# Patient Record
Sex: Male | Born: 1995 | Race: White | Hispanic: No | Marital: Single | Smoking: Never smoker
Health system: Southern US, Community
[De-identification: ages and names within clinical notes are randomized; demographics above are authoritative.]

## PROBLEM LIST (undated history)

## (undated) DIAGNOSIS — F1010 Alcohol abuse, uncomplicated: Secondary | ICD-10-CM

## (undated) DIAGNOSIS — K7460 Unspecified cirrhosis of liver: Secondary | ICD-10-CM

## (undated) DIAGNOSIS — K219 Gastro-esophageal reflux disease without esophagitis: Secondary | ICD-10-CM

## (undated) DIAGNOSIS — I509 Heart failure, unspecified: Secondary | ICD-10-CM

## (undated) DIAGNOSIS — K852 Alcohol induced acute pancreatitis without necrosis or infection: Secondary | ICD-10-CM

## (undated) HISTORY — PX: APPENDECTOMY: SHX54

## (undated) HISTORY — PX: TONSILLECTOMY: SUR1361

---
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
## 2017-01-03 IMAGING — CT CT ABD-PELV W/ CM
1 of 2 series · 15 of 32 positions shown, 19 images · IV contrast (omnipaque)
Comparison: None.

CLINICAL DATA: 19-year-old male left lower abdominal pain, nausea
and vomiting.

EXAM:
CT ABDOMEN AND PELVIS WITH CONTRAST
TECHNIQUE: Multidetector CT imaging of the abdomen and pelvis was performed
using the standard protocol following bolus administration of
intravenous contrast.
CONTRAST:  100mL OMNIPAQUE IOHEXOL 300 MG/ML  SOLN

[Series 2: routine abd pel with · axial · 0.70mm/px · z∈[-666,-226]mm · 15 of 97 slices shown, 19 images]
[im 5/97  soft-tissue]
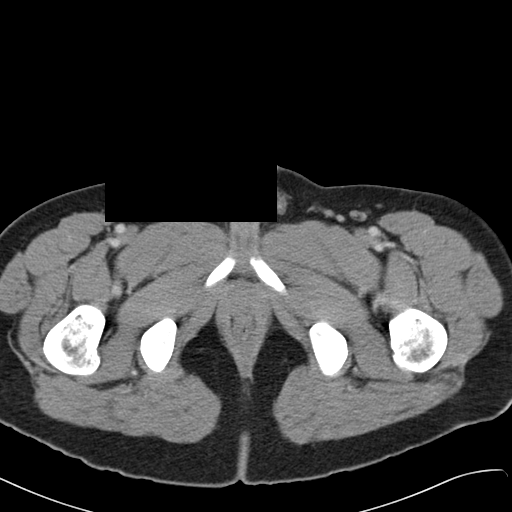
[im 5/97  bone]
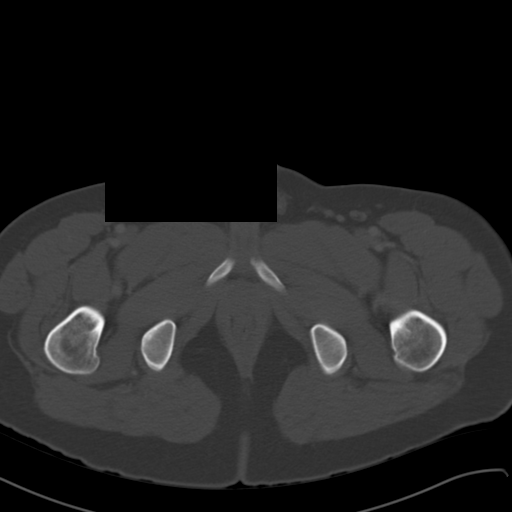
[im 13/97  soft-tissue]
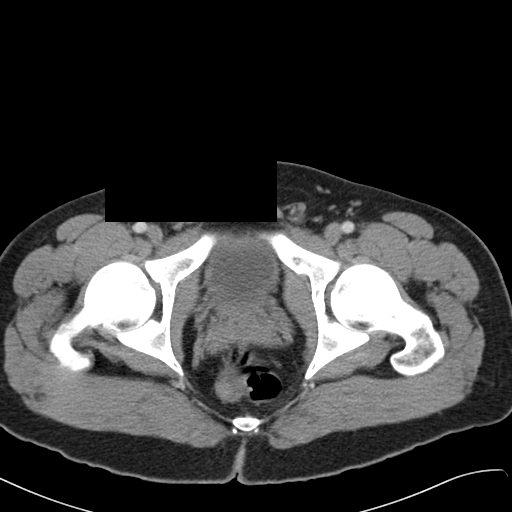
[im 21/97  soft-tissue]
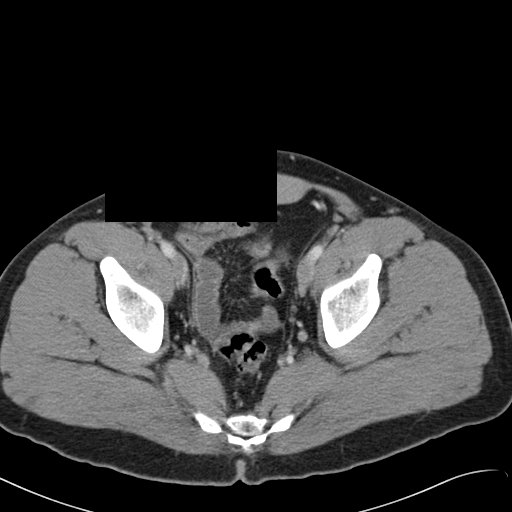
[im 29/97  soft-tissue]
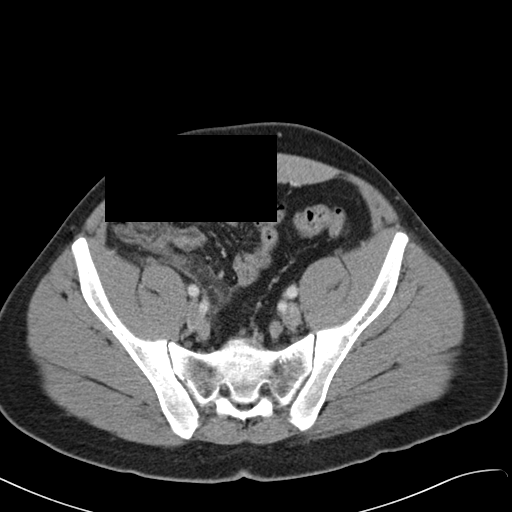
[im 33/97  soft-tissue]
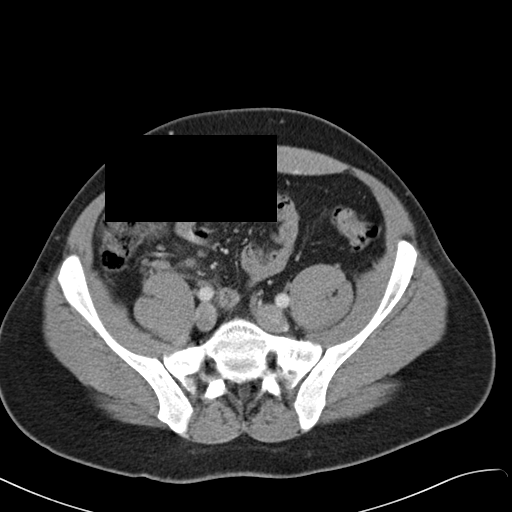
[im 41/97  soft-tissue]
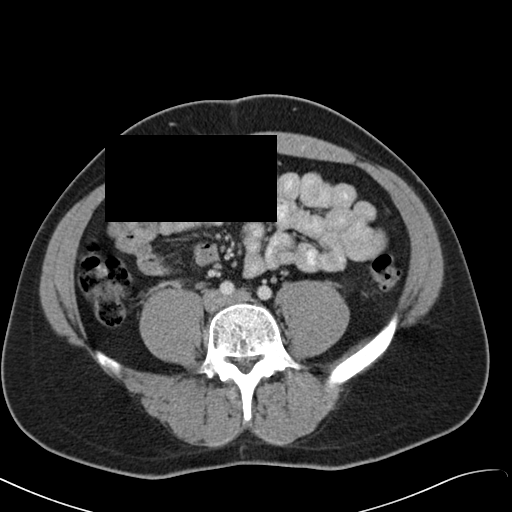
[im 49/97  soft-tissue]
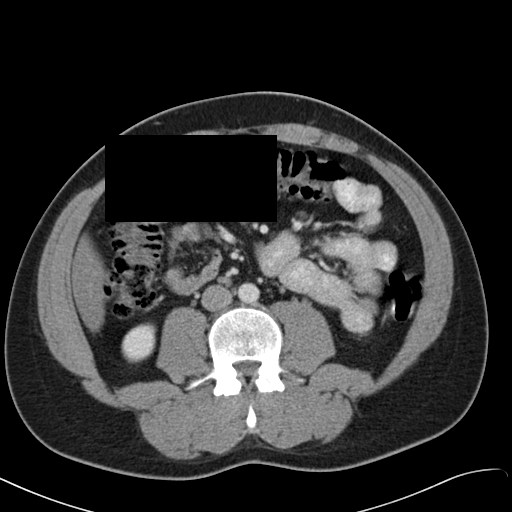
[im 57/97  soft-tissue]
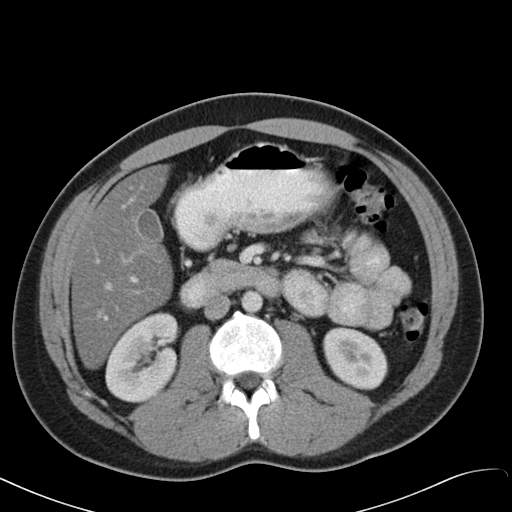
[im 65/97  soft-tissue]
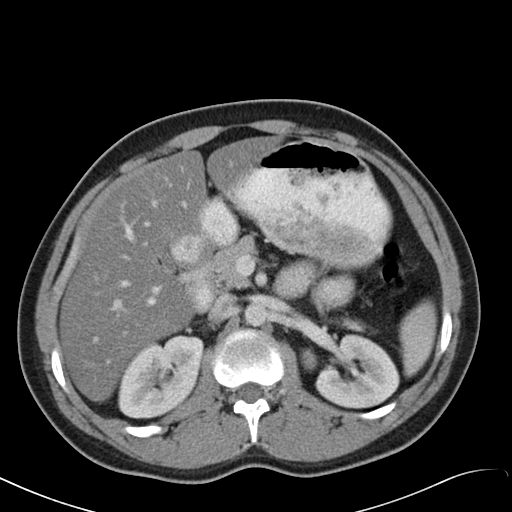
[im 65/97  bone]
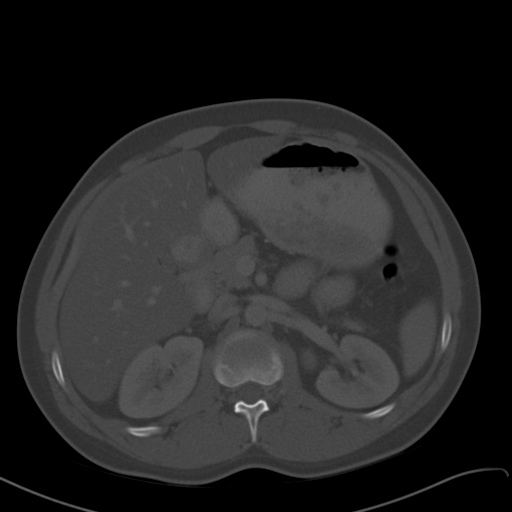
[im 69/97  soft-tissue]
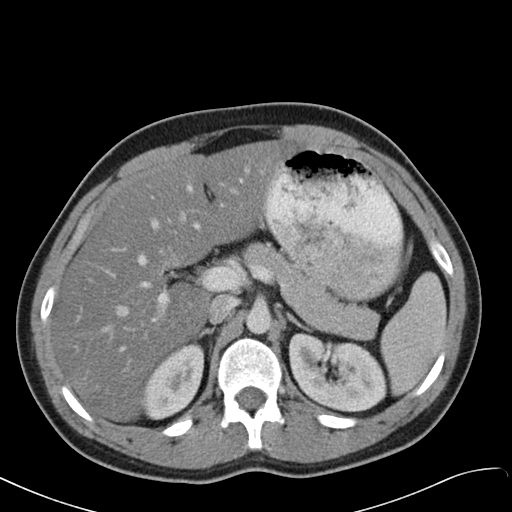
[im 77/97  soft-tissue]
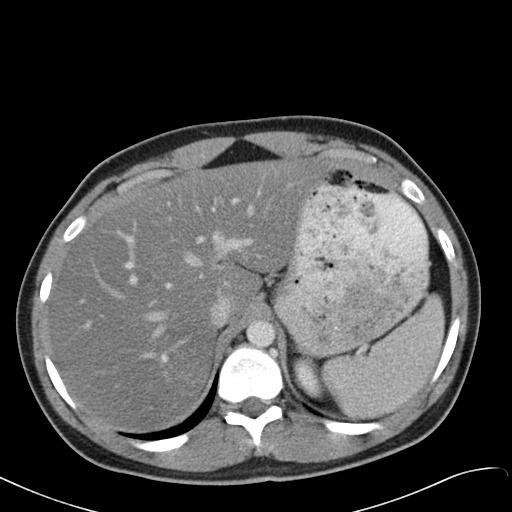
[im 81/97  lung]
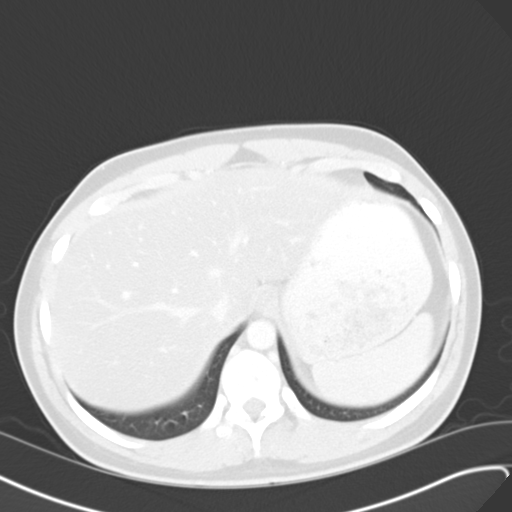
[im 85/97  soft-tissue]
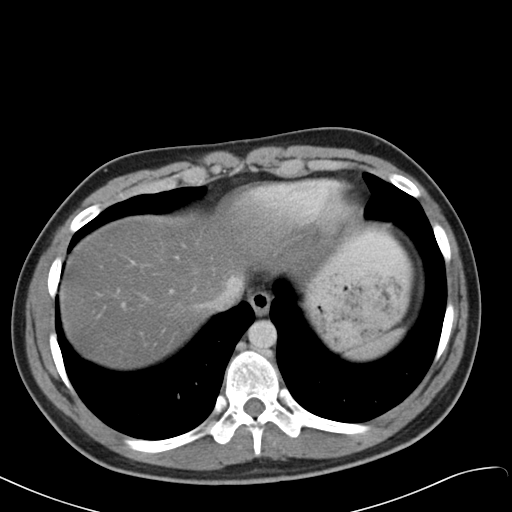
[im 85/97  lung]
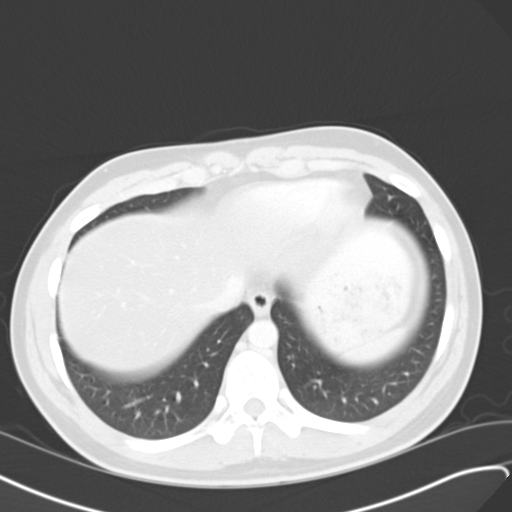
[im 89/97  lung]
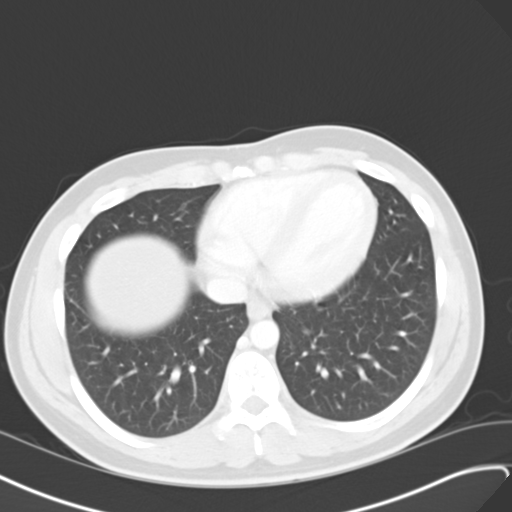
[im 93/97  soft-tissue]
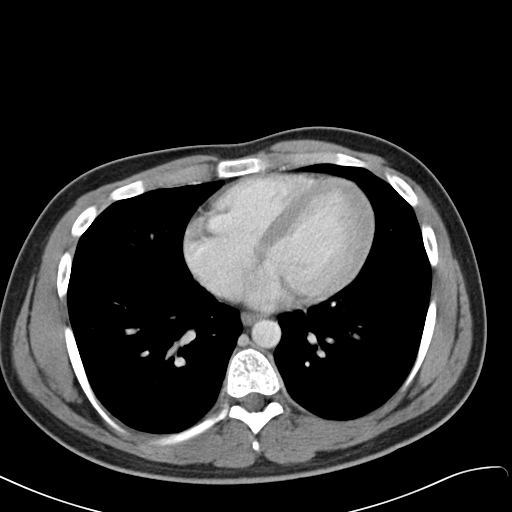
[im 93/97  lung]
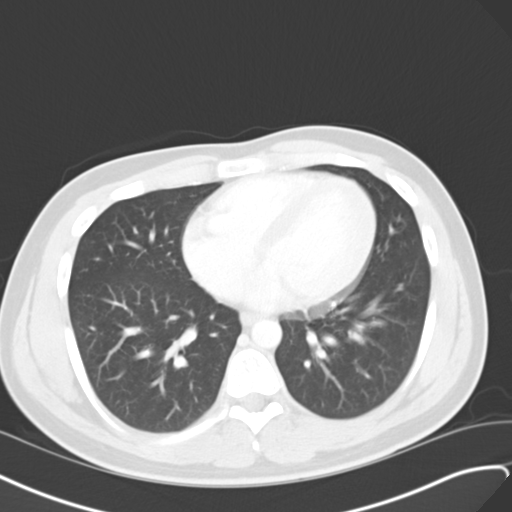

[15 of 32 positions shown; findings below may reference images not displayed]

FINDINGS: The visualized lung bases are clear. No intra-abdominal free air or
free fluid.

Diffuse hepatic steatosis. The gallbladder, pancreas, spleen,
adrenal glands, kidneys, visualized ureters, and urinary bladder
appear unremarkable. The prostate and seminal vesicles are grossly
unremarkable.

There is no evidence of bowel obstruction. Multiple normal caliber
fecalized loops of small bowel noted suggestive of chronic stasis.
The appendix is enlarged and inflamed measuring approximately 12 mm
in diameter. The appendix is located in the right hemipelvis
posterior to the cecum. No fluid collection/abscess. No evidence of
perforation.

The abdominal aorta and IVC appear unremarkable. No portal venous
gas identified. Top-normal right lower quadrant [place] nodes.
The abdominal wall soft tissues appear unremarkable. There is a Find
irregularity of the superior endplate of the T12 vertebra. No acute
fracture
IMPRESSION: Acute appendicitis.  No abscess.

## 2018-12-31 ENCOUNTER — Emergency Department: Payer: No Typology Code available for payment source

## 2018-12-31 ENCOUNTER — Emergency Department
Admission: EM | Admit: 2018-12-31 | Discharge: 2018-12-31 | Disposition: A | Discharge status: Home / Self Care | Payer: No Typology Code available for payment source | Attending: Emergency Medicine | Admitting: Emergency Medicine

## 2018-12-31 ENCOUNTER — Other Ambulatory Visit: Payer: Self-pay

## 2018-12-31 ENCOUNTER — Encounter: Payer: Self-pay | Admitting: Emergency Medicine

## 2018-12-31 DIAGNOSIS — R4781 Slurred speech: Secondary | ICD-10-CM | POA: Diagnosis not present

## 2018-12-31 DIAGNOSIS — S0081XA Abrasion of other part of head, initial encounter: Secondary | ICD-10-CM | POA: Diagnosis not present

## 2018-12-31 DIAGNOSIS — Z23 Encounter for immunization: Secondary | ICD-10-CM | POA: Diagnosis not present

## 2018-12-31 DIAGNOSIS — S0990XA Unspecified injury of head, initial encounter: Secondary | ICD-10-CM | POA: Diagnosis present

## 2018-12-31 DIAGNOSIS — Y929 Unspecified place or not applicable: Secondary | ICD-10-CM | POA: Diagnosis not present

## 2018-12-31 DIAGNOSIS — Z0289 Encounter for other administrative examinations: Secondary | ICD-10-CM | POA: Insufficient documentation

## 2018-12-31 DIAGNOSIS — Y9389 Activity, other specified: Secondary | ICD-10-CM | POA: Insufficient documentation

## 2018-12-31 DIAGNOSIS — Y998 Other external cause status: Secondary | ICD-10-CM | POA: Diagnosis not present

## 2018-12-31 DIAGNOSIS — F1010 Alcohol abuse, uncomplicated: Secondary | ICD-10-CM | POA: Diagnosis not present

## 2018-12-31 LAB — CBC WITH DIFFERENTIAL/PLATELET
Abs Immature Granulocytes: 0.01 10*3/uL (ref 0.00–0.07)
Basophils Absolute: 0.1 10*3/uL (ref 0.0–0.1)
Basophils Relative: 1 %
Eosinophils Absolute: 0.3 10*3/uL (ref 0.0–0.5)
Eosinophils Relative: 4 %
HCT: 47.1 % (ref 39.0–52.0)
Hemoglobin: 16.2 g/dL (ref 13.0–17.0)
Immature Granulocytes: 0 %
Lymphocytes Relative: 35 %
Lymphs Abs: 2.6 10*3/uL (ref 0.7–4.0)
MCH: 31.3 pg (ref 26.0–34.0)
MCHC: 34.4 g/dL (ref 30.0–36.0)
MCV: 90.9 fL (ref 80.0–100.0)
Monocytes Absolute: 0.6 10*3/uL (ref 0.1–1.0)
Monocytes Relative: 8 %
Neutro Abs: 3.8 10*3/uL (ref 1.7–7.7)
Neutrophils Relative %: 52 %
Platelets: 269 10*3/uL (ref 150–400)
RBC: 5.18 MIL/uL (ref 4.22–5.81)
RDW: 12.4 % (ref 11.5–15.5)
WBC: 7.3 10*3/uL (ref 4.0–10.5)
nRBC: 0 % (ref 0.0–0.2)

## 2018-12-31 LAB — HEPATIC FUNCTION PANEL
ALT: 59 U/L — ABNORMAL HIGH (ref 0–44)
AST: 51 U/L — ABNORMAL HIGH (ref 15–41)
Albumin: 4.5 g/dL (ref 3.5–5.0)
Alkaline Phosphatase: 60 U/L (ref 38–126)
Bilirubin, Direct: 0.1 mg/dL (ref 0.0–0.2)
Indirect Bilirubin: 0.9 mg/dL (ref 0.3–0.9)
Total Bilirubin: 1 mg/dL (ref 0.3–1.2)
Total Protein: 8.3 g/dL — ABNORMAL HIGH (ref 6.5–8.1)

## 2018-12-31 LAB — BASIC METABOLIC PANEL
Anion gap: 13 (ref 5–15)
BUN: 13 mg/dL (ref 6–20)
CO2: 22 mmol/L (ref 22–32)
Calcium: 8.6 mg/dL — ABNORMAL LOW (ref 8.9–10.3)
Chloride: 103 mmol/L (ref 98–111)
Creatinine, Ser: 0.64 mg/dL (ref 0.61–1.24)
GFR calc Af Amer: >60 mL/min (ref >60)
GFR calc non Af Amer: >60 mL/min (ref >60)
Glucose, Bld: 108 mg/dL — ABNORMAL HIGH (ref 70–99)
Potassium: 3.7 mmol/L (ref 3.5–5.1)
Sodium: 138 mmol/L (ref 135–145)

## 2018-12-31 MED ORDER — DROPERIDOL 2.5 MG/ML IJ SOLN
2.5000 mg | Freq: Once | INTRAMUSCULAR | Status: AC
  Administered 2018-12-31: 03:00:00 2.5 mg via INTRAMUSCULAR

## 2018-12-31 MED ORDER — DROPERIDOL 2.5 MG/ML IJ SOLN
2.5000 mg | Freq: Once | INTRAMUSCULAR | Status: AC
  Administered 2018-12-31: 04:00:00 2.5 mg via INTRAVENOUS

## 2018-12-31 MED ORDER — IOHEXOL 350 MG/ML SOLN
125.0000 mL | Freq: Once | INTRAVENOUS | Status: AC | PRN
  Administered 2018-12-31: 06:00:00 125 mL via INTRAVENOUS

## 2018-12-31 MED ORDER — TETANUS-DIPHTH-ACELL PERTUSSIS 5-2.5-18.5 LF-MCG/0.5 IM SUSP
0.5000 mL | Freq: Once | INTRAMUSCULAR | Status: AC
  Administered 2018-12-31: 05:00:00 0.5 mL via INTRAMUSCULAR
  Filled 2018-12-31: qty 0.5

## 2018-12-31 NOTE — ED Notes (Signed)
Pt agitated does not want to cooperate with RN Rn attempted to start IV pt refuses reports he is ready to go to jail

## 2018-12-31 NOTE — ED Notes (Signed)
X-ray at bedside
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
## 2018-12-31 NOTE — Discharge Instructions (Signed)
Your Cts were negative. There was concern for foreign body in your right nostril I did not see anything on exam but you can exam the area yourself as well.  You should cut down on drinking.
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
## 2018-12-31 NOTE — ED Notes (Signed)
Patient transported to CT 

## 2019-11-21 ENCOUNTER — Other Ambulatory Visit: Payer: Self-pay

## 2019-11-21 ENCOUNTER — Emergency Department: Payer: HRSA Program

## 2019-11-21 ENCOUNTER — Emergency Department
Admission: EM | Admit: 2019-11-21 | Discharge: 2019-11-21 | Disposition: A | Discharge status: Home / Self Care | Payer: HRSA Program | Attending: Emergency Medicine | Admitting: Emergency Medicine

## 2019-11-21 ENCOUNTER — Encounter: Payer: Self-pay | Admitting: Emergency Medicine

## 2019-11-21 DIAGNOSIS — R0602 Shortness of breath: Secondary | ICD-10-CM | POA: Diagnosis present

## 2019-11-21 DIAGNOSIS — U071 COVID-19: Secondary | ICD-10-CM | POA: Diagnosis not present

## 2019-11-21 DIAGNOSIS — J1282 Pneumonia due to coronavirus disease 2019: Secondary | ICD-10-CM | POA: Diagnosis not present

## 2019-11-21 LAB — RESPIRATORY PANEL BY RT PCR (FLU A&B, COVID)
Influenza A by PCR: NEGATIVE
Influenza B by PCR: NEGATIVE
SARS Coronavirus 2 by RT PCR: POSITIVE — AB

## 2019-11-21 LAB — COMPREHENSIVE METABOLIC PANEL
ALT: 63 U/L — ABNORMAL HIGH (ref 0–44)
AST: 55 U/L — ABNORMAL HIGH (ref 15–41)
Albumin: 4 g/dL (ref 3.5–5.0)
Alkaline Phosphatase: 51 U/L (ref 38–126)
Anion gap: 12 (ref 5–15)
BUN: 9 mg/dL (ref 6–20)
CO2: 25 mmol/L (ref 22–32)
Calcium: 8.9 mg/dL (ref 8.9–10.3)
Chloride: 95 mmol/L — ABNORMAL LOW (ref 98–111)
Creatinine, Ser: 0.87 mg/dL (ref 0.61–1.24)
GFR calc Af Amer: >60 mL/min (ref >60)
GFR calc non Af Amer: >60 mL/min (ref >60)
Glucose, Bld: 114 mg/dL — ABNORMAL HIGH (ref 70–99)
Potassium: 4 mmol/L (ref 3.5–5.1)
Sodium: 132 mmol/L — ABNORMAL LOW (ref 135–145)
Total Bilirubin: 1 mg/dL (ref 0.3–1.2)
Total Protein: 8.4 g/dL — ABNORMAL HIGH (ref 6.5–8.1)

## 2019-11-21 LAB — CBC WITH DIFFERENTIAL/PLATELET
Abs Immature Granulocytes: 0.02 10*3/uL (ref 0.00–0.07)
Basophils Absolute: 0 10*3/uL (ref 0.0–0.1)
Basophils Relative: 0 %
Eosinophils Absolute: 0 10*3/uL (ref 0.0–0.5)
Eosinophils Relative: 0 %
HCT: 45.7 % (ref 39.0–52.0)
Hemoglobin: 16.5 g/dL (ref 13.0–17.0)
Immature Granulocytes: 0 %
Lymphocytes Relative: 22 %
Lymphs Abs: 1.1 10*3/uL (ref 0.7–4.0)
MCH: 32 pg (ref 26.0–34.0)
MCHC: 36.1 g/dL — ABNORMAL HIGH (ref 30.0–36.0)
MCV: 88.6 fL (ref 80.0–100.0)
Monocytes Absolute: 0.3 10*3/uL (ref 0.1–1.0)
Monocytes Relative: 6 %
Neutro Abs: 3.6 10*3/uL (ref 1.7–7.7)
Neutrophils Relative %: 72 %
Platelets: 223 10*3/uL (ref 150–400)
RBC: 5.16 MIL/uL (ref 4.22–5.81)
RDW: 11.5 % (ref 11.5–15.5)
WBC: 5 10*3/uL (ref 4.0–10.5)
nRBC: 0 % (ref 0.0–0.2)

## 2019-11-21 LAB — TROPONIN I (HIGH SENSITIVITY): Troponin I (High Sensitivity): 6 ng/L (ref <18)

## 2019-11-21 LAB — LIPASE, BLOOD: Lipase: 82 U/L — ABNORMAL HIGH (ref 11–51)

## 2019-11-21 LAB — GROUP A STREP BY PCR: Group A Strep by PCR: NOT DETECTED

## 2019-11-21 MED ORDER — AZITHROMYCIN 500 MG PO TABS
500.0000 mg | ORAL_TABLET | Freq: Once | ORAL | Status: AC
  Administered 2019-11-21: 500 mg via ORAL
  Filled 2019-11-21: qty 1

## 2019-11-21 MED ORDER — PREDNISONE 20 MG PO TABS
60.0000 mg | ORAL_TABLET | Freq: Once | ORAL | Status: AC
  Administered 2019-11-21: 60 mg via ORAL
  Filled 2019-11-21: qty 3

## 2019-11-21 MED ORDER — ACETAMINOPHEN 325 MG PO TABS
650.0000 mg | ORAL_TABLET | Freq: Once | ORAL | Status: AC | PRN
  Administered 2019-11-21: 650 mg via ORAL
  Filled 2019-11-21: qty 2

## 2019-11-21 MED ORDER — ONDANSETRON 4 MG PO TBDP
4.0000 mg | ORAL_TABLET | Freq: Once | ORAL | Status: AC
  Administered 2019-11-21: 4 mg via ORAL
  Filled 2019-11-21: qty 1
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
## 2019-11-21 NOTE — ED Provider Notes (Signed)
[facility] Emergency Department Provider Note ____________________________________________  Time seen: 54  I have reviewed the triage vital signs and the nursing notes.  HISTORY  Chief Complaint  covid sx and Emesis  HPI Cameron Santiago is a 24 y.o. male presents himself to the ED for evaluation of nausea, vomiting, and diarrhea.  Patient also reports body aches and sore throat.  He also has been having some shortness of breath and chest pain at this time.  He has not taken any medication today for symptom relief.  Patient otherwise denies any significant medical history, and takes no daily medications.   History reviewed. No pertinent past medical history.  Patient Active Problem List   Diagnosis Date Noted  . Acute appendicitis 02/14/2015    Past Surgical History:  Procedure Laterality Date  . APPENDECTOMY    . LAPAROSCOPIC APPENDECTOMY N/A 02/15/2015   Procedure: APPENDECTOMY LAPAROSCOPIC;  Surgeon: Natale Lay, MD;  Location: [facility] ORS;  Service: General;  Laterality: N/A;  . TONSILLECTOMY      Prior to Admission medications   Medication Sig Start Date End Date Taking? Authorizing Provider  albuterol (VENTOLIN HFA) 108 (90 Base) MCG/ACT inhaler Inhale 2 puffs into the lungs every 6 (six) hours as needed for wheezing or shortness of breath. 11/22/19   Lucy Chris, PA  azithromycin (ZITHROMAX) 500 MG tablet Take 1 tablet (500 mg total) by mouth daily for 5 days. Take 1 tablet daily for 3 days. 11/22/19 11/27/19  Lucy Chris, PA  ondansetron (ZOFRAN) 4 MG tablet Take 1 tablet (4 mg total) by mouth daily as needed for nausea or vomiting. 11/22/19 11/21/20  Lucy Chris, PA  predniSONE (DELTASONE) 10 MG tablet Take 6 tablets (60 mg total) by mouth daily for 1 day, THEN 5 tablets (50 mg total) daily for 1 day, THEN 4 tablets (40 mg total) daily for 1 day, THEN 3 tablets (30 mg total) daily for 1 day, THEN 2 tablets (20 mg total) daily for 1  day, THEN 1 tablet (10 mg total) daily for 1 day. 11/22/19 11/28/19  Lucy Chris, PA    Allergies Patient has no known allergies.  History reviewed. No pertinent family history.  Social History Social History   Tobacco Use  . Smoking status: Never Smoker  . Smokeless tobacco: Never Used  Substance Use Topics  . Alcohol use: No  . Drug use: Never    Review of Systems  Constitutional: Positive for fever. Eyes: Negative for visual changes. ENT: Positive for sore throat. Cardiovascular: Positive for chest pain. Respiratory: Positive for shortness of breath. Gastrointestinal: Negative for abdominal pain. Reports nausea, vomiting and diarrhea. Genitourinary: Negative for dysuria. Musculoskeletal: Negative for back pain. Skin: Negative for rash. Neurological: Negative for headaches, focal weakness or numbness. ____________________________________________  PHYSICAL EXAM:  VITAL SIGNS: ED Triage Vitals [11/21/19 1827]  Enc Vitals Group     BP (!) 138/106     Pulse Rate (!) 102     Resp 20     Temp (!) 102.5 F (39.2 C)     Temp Source Oral     SpO2 96 %     Weight 240 lb (108.9 kg)     Height 5\' 10"  (1.778 m)     Head Circumference      Peak Flow      Pain Score 10     Pain Loc      Pain Edu?      Excl. in GC?  Constitutional: Alert and oriented. Well appearing and in no distress. Febrile on presentation  Head: Normocephalic and atraumatic. Eyes: Conjunctivae are normal. Normal extraocular movements Cardiovascular: Normal rate, regular rhythm. Normal distal pulses. Respiratory: Normal respiratory effort. No wheezes/rales/rhonchi. Gastrointestinal: Soft and nontender. No distention. Musculoskeletal: Nontender with normal range of motion in all extremities.  Neurologic:  Normal gait without ataxia. Normal speech and language. No gross focal neurologic deficits are appreciated. Skin:  Skin is warm, dry and intact. No rash noted. Psychiatric: Mood and affect  are normal. Patient exhibits appropriate insight and judgment. ____________________________________________   LABS (pertinent positives/negatives) Labs Reviewed  RESPIRATORY PANEL BY RT PCR (FLU A&B, COVID) - Abnormal; Notable for the following components:      Result Value   SARS Coronavirus 2 by RT PCR POSITIVE (*)    All other components within normal limits  COMPREHENSIVE METABOLIC PANEL - Abnormal; Notable for the following components:   Sodium 132 (*)    Chloride 95 (*)    Glucose, Bld 114 (*)    Total Protein 8.4 (*)    AST 55 (*)    ALT 63 (*)    All other components within normal limits  LIPASE, BLOOD - Abnormal; Notable for the following components:   Lipase 82 (*)    All other components within normal limits  CBC WITH DIFFERENTIAL/PLATELET - Abnormal; Notable for the following components:   MCHC 36.1 (*)    All other components within normal limits  GROUP A STREP BY PCR  TROPONIN I (HIGH SENSITIVITY)  ____________________________________________  EKG  ____________________________________________   RADIOLOGY  CXR  IMPRESSION: Probable multifocal viral pneumonia ____________________________________________  PROCEDURES  Tylenol 650 mg PO Azithromycin 500 mg PO Zofran 4 mg ODT Prednisone 60 mg PO  Procedures ____________________________________________  INITIAL IMPRESSION / ASSESSMENT AND PLAN / ED COURSE  DDX: Covid, CAP, sinusitis, AOM, strep pharyngitis  Patient with ED evaluation of a weeks worth of symptoms including fevers, cough, shortness of breath.  Patient was found to be stable on exam without signs of acute respiratory distress, dehydration, or sepsis cyst.  His chest x-ray did confirm multifocal changes consistent with a viral etiology.  He was discharged with a presumptive diagnosis of Covid pneumonia, with prescriptions for azithromycin, prednisone, albuterol inhalers, Zofran, Tessalon Perles.  His Covid test did confirm positive at the  patient at discharge.  He was given appropriate discharge instructions including instructions to continue to monitor and treat fevers with Tylenol, and remain in house quarantine for an additional 7 to 10 days until symptoms improve.  Return precautions have been discussed and patient is discharged at this time to his own care.  Cameron Santiago was evaluated in Emergency Department on 11/22/2019 for the symptoms described in the history of present illness. He was evaluated in the context of the global COVID-19 pandemic, which necessitated consideration that the patient might be at risk for infection with the SARS-CoV-2 virus that causes COVID-19. Institutional protocols and algorithms that pertain to the evaluation of patients at risk for COVID-19 are in a state of rapid change based on information released by regulatory bodies including the CDC and federal and state organizations. These policies and algorithms were followed during the patient's care in the ED. ____________________________________________  FINAL CLINICAL IMPRESSION(S) / ED DIAGNOSES  Final diagnoses:  COVID-19  Pneumonia due to COVID-19 virus      Lissa Hoard, PA-C 11/22/19 1619    Phineas Semen, MD 11/23/19 [phone]

## 2019-11-21 NOTE — ED Notes (Signed)
Pt staets that he hasn't been feeling good and that he's had cp, shob, a headache, and a cough. Pt denies covid contacts to his knowledge /

## 2019-11-21 NOTE — Discharge Instructions (Signed)
Remain under house quarantine for another week, and until it is improved.  Take the prescription medications as provided.  Continue to hydrate to prevent dehydration.  Follow-up with your provider [facility], or return to the ED for worsening symptoms.  You may continue to take Tylenol at 1000 mg 3 times a day for fever and body aches.

## 2019-11-22 ENCOUNTER — Telehealth: Payer: Self-pay | Admitting: Student

## 2019-11-22 ENCOUNTER — Telehealth: Payer: Self-pay | Admitting: Infectious Diseases

## 2019-11-22 MED ORDER — PREDNISONE 10 MG PO TABS: ORAL_TABLET | ORAL | 0 refills | Status: AC

## 2019-11-22 MED ORDER — ALBUTEROL SULFATE HFA 108 (90 BASE) MCG/ACT IN AERS: 2.0000 | INHALATION_SPRAY | Freq: Four times a day (QID) | RESPIRATORY_TRACT | 2 refills | Status: DC | PRN

## 2019-11-22 MED ORDER — ONDANSETRON HCL 4 MG PO TABS: 4.0000 mg | ORAL_TABLET | Freq: Every day | ORAL | 1 refills | Status: DC | PRN

## 2019-11-22 MED ORDER — AZITHROMYCIN 500 MG PO TABS: 500.0000 mg | ORAL_TABLET | Freq: Every day | ORAL | 0 refills | Status: AC

## 2019-11-22 NOTE — Telephone Encounter (Signed)
Called to Discuss with patient about Covid symptoms and the use of the monoclonal antibody infusion for those with mild to moderate Covid symptoms and at a high risk of hospitalization.     Pt appears to qualify for this infusion due to co-morbid conditions and/or a member of an at-risk group in accordance with the FDA Emergency Use Authorization.    He is going to plan on picking up the medications he was given in the ER last night. Politely declined at this time. Sx started about 3-4 days ago.    Rexene Alberts, MSN, NP-C [facility]  [email or web address] Pager: [phone] Office: [phone] [facility] Main Line: [phone]

## 2019-11-22 NOTE — Telephone Encounter (Cosign Needed)
The patient was seen in the ER last night, however medications did not arrive at his pharmacy.  They were sent at this time.

## 2020-08-27 ENCOUNTER — Emergency Department: Payer: Self-pay

## 2020-08-27 ENCOUNTER — Emergency Department
Admission: EM | Admit: 2020-08-27 | Discharge: 2020-08-27 | Disposition: A | Discharge status: Home / Self Care | Payer: Self-pay | Attending: Emergency Medicine | Admitting: Emergency Medicine

## 2020-08-27 ENCOUNTER — Other Ambulatory Visit: Payer: Self-pay

## 2020-08-27 DIAGNOSIS — E860 Dehydration: Secondary | ICD-10-CM | POA: Insufficient documentation

## 2020-08-27 DIAGNOSIS — R7401 Elevation of levels of liver transaminase levels: Secondary | ICD-10-CM | POA: Insufficient documentation

## 2020-08-27 DIAGNOSIS — R112 Nausea with vomiting, unspecified: Secondary | ICD-10-CM | POA: Insufficient documentation

## 2020-08-27 DIAGNOSIS — R1084 Generalized abdominal pain: Secondary | ICD-10-CM | POA: Insufficient documentation

## 2020-08-27 DIAGNOSIS — R197 Diarrhea, unspecified: Secondary | ICD-10-CM | POA: Insufficient documentation

## 2020-08-27 DIAGNOSIS — R1011 Right upper quadrant pain: Secondary | ICD-10-CM | POA: Insufficient documentation

## 2020-08-27 LAB — HEPATITIS PANEL, ACUTE
HCV Ab: NONREACTIVE
Hep A IgM: NONREACTIVE
Hep B C IgM: NONREACTIVE
Hepatitis B Surface Ag: NONREACTIVE

## 2020-08-27 LAB — URINALYSIS, COMPLETE (UACMP) WITH MICROSCOPIC
Bacteria, UA: NONE SEEN
Bilirubin Urine: NEGATIVE
Glucose, UA: NEGATIVE mg/dL
Hgb urine dipstick: NEGATIVE
Ketones, ur: 20 mg/dL — AB
Leukocytes,Ua: NEGATIVE
Nitrite: NEGATIVE
Protein, ur: 100 mg/dL — AB
Specific Gravity, Urine: 1.026 (ref 1.005–1.030)
Squamous Epithelial / HPF: NONE SEEN (ref 0–5)
pH: 5 (ref 5.0–8.0)

## 2020-08-27 LAB — CBC
HCT: 43.7 % (ref 39.0–52.0)
Hemoglobin: 16 g/dL (ref 13.0–17.0)
MCH: 33.5 pg (ref 26.0–34.0)
MCHC: 36.6 g/dL — ABNORMAL HIGH (ref 30.0–36.0)
MCV: 91.6 fL (ref 80.0–100.0)
Platelets: 203 10*3/uL (ref 150–400)
RBC: 4.77 MIL/uL (ref 4.22–5.81)
RDW: 12.1 % (ref 11.5–15.5)
WBC: 12.8 10*3/uL — ABNORMAL HIGH (ref 4.0–10.5)
nRBC: 0 % (ref 0.0–0.2)

## 2020-08-27 LAB — LIPASE, BLOOD: Lipase: 137 U/L — ABNORMAL HIGH (ref 11–51)

## 2020-08-27 LAB — COMPREHENSIVE METABOLIC PANEL
ALT: 64 U/L — ABNORMAL HIGH (ref 0–44)
AST: 99 U/L — ABNORMAL HIGH (ref 15–41)
Albumin: 4.1 g/dL (ref 3.5–5.0)
Alkaline Phosphatase: 97 U/L (ref 38–126)
Anion gap: 11 (ref 5–15)
BUN: 6 mg/dL (ref 6–20)
CO2: 24 mmol/L (ref 22–32)
Calcium: 8.7 mg/dL — ABNORMAL LOW (ref 8.9–10.3)
Chloride: 99 mmol/L (ref 98–111)
Creatinine, Ser: 0.69 mg/dL (ref 0.61–1.24)
GFR, Estimated: >60 mL/min (ref >60)
Glucose, Bld: 107 mg/dL — ABNORMAL HIGH (ref 70–99)
Potassium: 3.3 mmol/L — ABNORMAL LOW (ref 3.5–5.1)
Sodium: 134 mmol/L — ABNORMAL LOW (ref 135–145)
Total Bilirubin: 1.7 mg/dL — ABNORMAL HIGH (ref 0.3–1.2)
Total Protein: 8.2 g/dL — ABNORMAL HIGH (ref 6.5–8.1)

## 2020-08-27 MED ORDER — IBUPROFEN 600 MG PO TABS: 600.0000 mg | ORAL_TABLET | Freq: Three times a day (TID) | ORAL | 0 refills | Status: DC | PRN

## 2020-08-27 MED ORDER — ONDANSETRON HCL 4 MG/2ML IJ SOLN
4.0000 mg | Freq: Once | INTRAMUSCULAR | Status: AC
  Administered 2020-08-27: 4 mg via INTRAVENOUS
  Filled 2020-08-27: qty 2

## 2020-08-27 MED ORDER — SODIUM CHLORIDE 0.9 % IV BOLUS
1000.0000 mL | Freq: Once | INTRAVENOUS | Status: AC
  Administered 2020-08-27: 1000 mL via INTRAVENOUS

## 2020-08-27 MED ORDER — ONDANSETRON 4 MG PO TBDP: 4.0000 mg | ORAL_TABLET | Freq: Three times a day (TID) | ORAL | 0 refills | Status: DC | PRN

## 2020-08-27 MED ORDER — HYDROMORPHONE HCL 1 MG/ML IJ SOLN
1.0000 mg | Freq: Once | INTRAMUSCULAR | Status: AC
  Administered 2020-08-27: 1 mg via INTRAVENOUS
  Filled 2020-08-27: qty 1

## 2020-08-27 MED ORDER — DICYCLOMINE HCL 10 MG PO CAPS: 10.0000 mg | ORAL_CAPSULE | Freq: Three times a day (TID) | ORAL | 0 refills | Status: DC | PRN

## 2020-08-27 NOTE — ED Provider Notes (Signed)
[facility] Emergency Department Provider Note  ____________________________________________   Event Date/Time   First MD Initiated Contact with Patient 08/27/20 1539     (approximate)  I have reviewed the triage vital signs and the nursing notes.   HISTORY  Chief Complaint Abdominal Pain    HPI Cameron Santiago is a 25 y.o. male  here with abdominal pain. Pt reports his sx started 3-4 days ago with low grade fever, nausea, and abd cramping. Since then, his fever has resolved but he's had ongoing, diffuse abd pain and cramping along with nausea, vomiting, and diarrhea. He has had intermittent but not constant pain, worse w/ eating. No alleviating factors. [place] the onset of sx but denies specific sick contacts or suspicious food intake. No blood in emesis or stool. No other complaints. No dysuria, hematuria. No cough or SOB.        History reviewed. No pertinent past medical history.  Patient Active Problem List   Diagnosis Date Noted   Acute appendicitis 02/14/2015    Past Surgical History:  Procedure Laterality Date   APPENDECTOMY     LAPAROSCOPIC APPENDECTOMY N/A 02/15/2015   Procedure: APPENDECTOMY LAPAROSCOPIC;  Surgeon: Natale Lay, MD;  Location: [facility] ORS;  Service: General;  Laterality: N/A;   TONSILLECTOMY      Prior to Admission medications   Medication Sig Start Date End Date Taking? Authorizing Provider  dicyclomine (BENTYL) 10 MG capsule Take 1 capsule (10 mg total) by mouth 3 (three) times daily as needed for spasms. 08/27/20  Yes Shaune Pollack, MD  ibuprofen (ADVIL) 600 MG tablet Take 1 tablet (600 mg total) by mouth every 8 (eight) hours as needed for moderate pain. 08/27/20  Yes Shaune Pollack, MD  ondansetron (ZOFRAN ODT) 4 MG disintegrating tablet Take 1 tablet (4 mg total) by mouth every 8 (eight) hours as needed for nausea or vomiting. 08/27/20  Yes Shaune Pollack, MD  albuterol (VENTOLIN HFA) 108 (90  Base) MCG/ACT inhaler Inhale 2 puffs into the lungs every 6 (six) hours as needed for wheezing or shortness of breath. 11/22/19   Lucy Chris, PA  ondansetron (ZOFRAN) 4 MG tablet Take 1 tablet (4 mg total) by mouth daily as needed for nausea or vomiting. 11/22/19 11/21/20  Lucy Chris, PA    Allergies Patient has no known allergies.  No family history on file.  Social History Social History   Tobacco Use   Smoking status: Never   Smokeless tobacco: Never  Substance Use Topics   Alcohol use: No   Drug use: Never    Review of Systems  Review of Systems  Constitutional:  Positive for fatigue. Negative for chills and fever.  HENT:  Negative for sore throat.   Respiratory:  Negative for shortness of breath.   Cardiovascular:  Negative for chest pain.  Gastrointestinal:  Positive for abdominal pain, diarrhea, nausea and vomiting.  Genitourinary:  Negative for flank pain.  Musculoskeletal:  Negative for neck pain.  Skin:  Negative for rash and wound.  Allergic/Immunologic: Negative for immunocompromised state.  Neurological:  Negative for weakness and numbness.  Hematological:  Does not bruise/bleed easily.  All other systems reviewed and are negative.   ____________________________________________  PHYSICAL EXAM:      VITAL SIGNS: ED Triage Vitals  Enc Vitals Group     BP 08/27/20 1350 (!) 162/124     Pulse Rate 08/27/20 1348 93     Resp 08/27/20 1348 16  Temp 08/27/20 1348 98.3 F (36.8 C)     Temp Source 08/27/20 1348 Oral     SpO2 08/27/20 1348 97 %     Weight --      Height 08/27/20 1348 5\' 11"  (1.803 m)     Head Circumference --      Peak Flow --      Pain Score 08/27/20 1348 6     Pain Loc --      Pain Edu? --      Excl. in GC? --      Physical Exam Vitals and nursing note reviewed.  Constitutional:      General: He is not in acute distress.    Appearance: He is well-developed.  HENT:     Head: Normocephalic and atraumatic.  Eyes:      Conjunctiva/sclera: Conjunctivae normal.  Cardiovascular:     Rate and Rhythm: Normal rate and regular rhythm.     Heart sounds: Normal heart sounds. No murmur heard.   No friction rub.  Pulmonary:     Effort: Pulmonary effort is normal. No respiratory distress.     Breath sounds: Normal breath sounds. No wheezing or rales.  Abdominal:     General: There is no distension.     Palpations: Abdomen is soft.     Tenderness: There is no abdominal tenderness (abdomen is actuanlly non-tender). There is no guarding or rebound. Negative signs include Murphy's sign, Rovsing's sign and McBurney's sign.  Musculoskeletal:     Cervical back: Neck supple.  Skin:    General: Skin is warm.     Capillary Refill: Capillary refill takes less than 2 seconds.  Neurological:     Mental Status: He is alert and oriented to person, place, and time.     Motor: No abnormal muscle tone.      ____________________________________________   LABS (all labs ordered are listed, but only abnormal results are displayed)  Labs Reviewed  LIPASE, BLOOD - Abnormal; Notable for the following components:      Result Value   Lipase 137 (*)    All other components within normal limits  COMPREHENSIVE METABOLIC PANEL - Abnormal; Notable for the following components:   Sodium 134 (*)    Potassium 3.3 (*)    Glucose, Bld 107 (*)    Calcium 8.7 (*)    Total Protein 8.2 (*)    AST 99 (*)    ALT 64 (*)    Total Bilirubin 1.7 (*)    All other components within normal limits  CBC - Abnormal; Notable for the following components:   WBC 12.8 (*)    MCHC 36.6 (*)    All other components within normal limits  URINALYSIS, COMPLETE (UACMP) WITH MICROSCOPIC - Abnormal; Notable for the following components:   Color, Urine AMBER (*)    APPearance HAZY (*)    Ketones, ur 20 (*)    Protein, ur 100 (*)    All other components within normal limits  HEPATITIS PANEL, ACUTE    ____________________________________________  EKG:   ________________________________________  RADIOLOGY All imaging, including plain films, CT scans, and ultrasounds, independently reviewed by me, and interpretations confirmed via formal radiology reads.  ED MD interpretation:   10/28/20 RUQ: Negative for gallstones, diffuse hepatic steatosis  Official radiology report(s): US Abdomen Limited RUQ (LIVER/GB)  Result Date: 08/27/2020 CLINICAL DATA:  Right upper quadrant pain EXAM: ULTRASOUND ABDOMEN LIMITED RIGHT UPPER QUADRANT COMPARISON:  CT 12/31/2018 FINDINGS: Gallbladder: No gallstones or wall thickening visualized.  No sonographic Murphy sign noted by sonographer. Common bile duct: Diameter: 3.3 mm Liver: Diffusely echogenic. No focal hepatic abnormality portal vein is patent on color Doppler imaging with normal direction of blood flow towards the liver. Other: None. IMPRESSION: 1. Negative for gallstones. 2. Diffusely echogenic liver consistent with hepatic steatosis. Electronically Signed   By: Jasmine Pang M.D.   On: 08/27/2020 17:21    ____________________________________________  PROCEDURES   Procedure(s) performed (including Critical Care):  Procedures  ____________________________________________  INITIAL IMPRESSION / MDM / ASSESSMENT AND PLAN / ED COURSE  As part of my medical decision making, I reviewed the following data within the electronic MEDICAL RECORD NUMBER Nursing notes reviewed and incorporated, Old chart reviewed, Notes from prior ED visits, and [place] Controlled Substance Database       *Cameron Santiago was evaluated in Emergency Department on 08/27/2020 for the symptoms described in the history of present illness. He was evaluated in the context of the global COVID-19 pandemic, which necessitated consideration that the patient might be at risk for infection with the SARS-CoV-2 virus that causes COVID-19. Institutional protocols and algorithms that pertain to the evaluation of patients at risk for COVID-19 are in a state of  rapid change based on information released by regulatory bodies including the CDC and federal and state organizations. These policies and algorithms were followed during the patient's care in the ED.  Some ED evaluations and interventions may be delayed as a result of limited staffing during the pandemic.*     Medical Decision Making: Well-appearing 25 year old male here with intermittent epigastric discomfort, nausea, vomiting, diarrhea.  Suspect viral GI illness, versus alcoholic gastritis with possible hepatitis.  Patient admits to fairly heavy, regular alcohol use.  On review of his prior labs, he has a chronic transaminitis which could be related to fatty liver disease versus alcoholic liver disease.  On exam today, his abdomen is very soft and nontender.  Ultrasound obtained, reviewed, shows no evidence of gallstones.  He does have hepatic steatosis.  Mild, likely reactive leukocytosis noted.  CMP shows mild transaminitis.  Lipase 137, which is below 5 times the upper limit of normal, but slightly elevated.  Urinalysis is consistent with dehydration.  Serial abdominal exams benign with no signs of significant abdominal tenderness or evidence to suggest severe pancreatitis.  Given his lab work, suspect most of this could be related to his alcohol use.  I discussed this with him in detail, will advise decreasing this, outpatient GI follow-up.  Given absence of any tenderness, serial benign abdominal exams, and chronic AST and ALT elevation, do not feel CT imaging is warranted at this time.  ____________________________________________  FINAL CLINICAL IMPRESSION(S) / ED DIAGNOSES  Final diagnoses:  RUQ pain  Dehydration  Transaminitis     MEDICATIONS GIVEN DURING THIS VISIT:  Medications  HYDROmorphone (DILAUDID) injection 1 mg (1 mg Intravenous Given 08/27/20 1604)  ondansetron (ZOFRAN) injection 4 mg (4 mg Intravenous Given 08/27/20 1604)  sodium chloride 0.9 % bolus 1,000 mL (1,000 mLs  Intravenous New Bag/Given 08/27/20 1646)     ED Discharge Orders          Ordered    ondansetron (ZOFRAN ODT) 4 MG disintegrating tablet  Every 8 hours PRN        08/27/20 1829    dicyclomine (BENTYL) 10 MG capsule  3 times daily PRN        08/27/20 1829    ibuprofen (ADVIL) 600 MG tablet  Every 8 hours PRN  08/27/20 1829             Note:  This document was prepared using Dragon voice recognition software and may include unintentional dictation errors.   Shaune PollackIsaacs, Danylle Ouk, MD 08/27/20 [phone]

## 2020-08-27 NOTE — ED Triage Notes (Signed)
Pt to ER via [facility] with complaints of RUQ pain and lower abdominal cramping and diarrhea x3 days. Denies urinary symptoms.

## 2020-08-27 NOTE — Discharge Instructions (Addendum)
Try to decrease the amount of alcohol you are drinking  Take Ibuprofen, not tylenol  Drink plenty of fluids  Follow-up with a primary doctor in 1-2 weeks

## 2020-11-18 ENCOUNTER — Other Ambulatory Visit: Payer: Self-pay

## 2020-11-18 DIAGNOSIS — Z79899 Other long term (current) drug therapy: Secondary | ICD-10-CM

## 2020-11-18 DIAGNOSIS — K760 Fatty (change of) liver, not elsewhere classified: Secondary | ICD-10-CM | POA: Diagnosis present

## 2020-11-18 DIAGNOSIS — Z20822 Contact with and (suspected) exposure to covid-19: Secondary | ICD-10-CM | POA: Diagnosis present

## 2020-11-18 DIAGNOSIS — K852 Alcohol induced acute pancreatitis without necrosis or infection: Principal | ICD-10-CM | POA: Diagnosis present

## 2020-11-18 DIAGNOSIS — F1010 Alcohol abuse, uncomplicated: Secondary | ICD-10-CM | POA: Diagnosis present

## 2020-11-18 DIAGNOSIS — K7010 Alcoholic hepatitis without ascites: Secondary | ICD-10-CM | POA: Diagnosis present

## 2020-11-18 LAB — CBC
HCT: 42.7 % (ref 39.0–52.0)
Hemoglobin: 15.6 g/dL (ref 13.0–17.0)
MCH: 35.5 pg — ABNORMAL HIGH (ref 26.0–34.0)
MCHC: 36.5 g/dL — ABNORMAL HIGH (ref 30.0–36.0)
MCV: 97 fL (ref 80.0–100.0)
Platelets: 171 10*3/uL (ref 150–400)
RBC: 4.4 MIL/uL (ref 4.22–5.81)
RDW: 11.9 % (ref 11.5–15.5)
WBC: 8.4 10*3/uL (ref 4.0–10.5)
nRBC: 0 % (ref 0.0–0.2)

## 2020-11-18 LAB — COMPREHENSIVE METABOLIC PANEL
ALT: 120 U/L — ABNORMAL HIGH (ref 0–44)
AST: 223 U/L — ABNORMAL HIGH (ref 15–41)
Albumin: 4.8 g/dL (ref 3.5–5.0)
Alkaline Phosphatase: 105 U/L (ref 38–126)
Anion gap: 14 (ref 5–15)
BUN: 7 mg/dL (ref 6–20)
CO2: 26 mmol/L (ref 22–32)
Calcium: 8.8 mg/dL — ABNORMAL LOW (ref 8.9–10.3)
Chloride: 96 mmol/L — ABNORMAL LOW (ref 98–111)
Creatinine, Ser: 0.72 mg/dL (ref 0.61–1.24)
GFR, Estimated: >60 mL/min (ref >60)
Glucose, Bld: 113 mg/dL — ABNORMAL HIGH (ref 70–99)
Potassium: 3.5 mmol/L (ref 3.5–5.1)
Sodium: 136 mmol/L (ref 135–145)
Total Bilirubin: 2.9 mg/dL — ABNORMAL HIGH (ref 0.3–1.2)
Total Protein: 8.5 g/dL — ABNORMAL HIGH (ref 6.5–8.1)

## 2020-11-18 LAB — LIPASE, BLOOD: Lipase: 413 U/L — ABNORMAL HIGH (ref 11–51)

## 2020-11-18 NOTE — ED Triage Notes (Signed)
Pt comes with c/o LLQ pain since this morning. Pt states some N/V/D. Pt is diaphoretic.

## 2020-11-18 NOTE — ED Provider Notes (Signed)
Emergency Medicine Provider Triage Evaluation Note  Cameron Santiago, a 25 y.o. male  was evaluated in triage.  Pt complains of left lower quadrant pain with associated nausea, vomiting, and diarrhea.  Patient presents afebrile but is diaphoretic.  Denies any history of colitis, gastritis, or diverticulitis.  Review of Systems  Positive: LLQ abd pain, NVD Negative: CP, SOB  Physical Exam  There were no vitals taken for this visit. Gen:   Awake, no distress  diap Resp:  Normal effort CTA MSK:   Moves extremities without difficulty  Other:  ABD: soft,mildly tender LLQ. No flank tenderness  Medical Decision Making  Medically screening exam initiated at 6:26 PM.  Appropriate orders placed.  Cameron Santiago was informed that the remainder of the evaluation will be completed by another provider, this initial triage assessment does not replace that evaluation, and the importance of remaining in the ED until their evaluation is complete.  Patient ED evaluation of left lower quadrant abdominal pain with associated nausea, vomiting, diarrhea.   Lissa Hoard, PA-C 11/18/20 1830    Shaune Pollack, MD 11/19/20 [phone]

## 2020-11-19 ENCOUNTER — Emergency Department: Payer: Self-pay

## 2020-11-19 ENCOUNTER — Encounter: Payer: Self-pay | Admitting: Radiology

## 2020-11-19 ENCOUNTER — Inpatient Hospital Stay
Admission: EM | Admit: 2020-11-19 | Discharge: 2020-11-20 | DRG: 440 | Disposition: A | Discharge status: Home / Self Care | Payer: Self-pay | Attending: Internal Medicine | Admitting: Internal Medicine

## 2020-11-19 DIAGNOSIS — F10239 Alcohol dependence with withdrawal, unspecified: Secondary | ICD-10-CM

## 2020-11-19 DIAGNOSIS — R52 Pain, unspecified: Secondary | ICD-10-CM

## 2020-11-19 DIAGNOSIS — K852 Alcohol induced acute pancreatitis without necrosis or infection: Principal | ICD-10-CM | POA: Diagnosis present

## 2020-11-19 DIAGNOSIS — R112 Nausea with vomiting, unspecified: Secondary | ICD-10-CM

## 2020-11-19 DIAGNOSIS — R569 Unspecified convulsions: Secondary | ICD-10-CM

## 2020-11-19 DIAGNOSIS — R7989 Other specified abnormal findings of blood chemistry: Secondary | ICD-10-CM

## 2020-11-19 DIAGNOSIS — K859 Acute pancreatitis without necrosis or infection, unspecified: Secondary | ICD-10-CM

## 2020-11-19 LAB — URINALYSIS, COMPLETE (UACMP) WITH MICROSCOPIC
Bacteria, UA: NONE SEEN
Bilirubin Urine: NEGATIVE
Glucose, UA: NEGATIVE mg/dL
Hgb urine dipstick: NEGATIVE
Ketones, ur: 80 mg/dL — AB
Leukocytes,Ua: NEGATIVE
Nitrite: NEGATIVE
Protein, ur: NEGATIVE mg/dL
Specific Gravity, Urine: 1.032 — ABNORMAL HIGH (ref 1.005–1.030)
Squamous Epithelial / HPF: NONE SEEN (ref 0–5)
pH: 6 (ref 5.0–8.0)

## 2020-11-19 LAB — CBC
HCT: 40.8 % (ref 39.0–52.0)
Hemoglobin: 14.9 g/dL (ref 13.0–17.0)
MCH: 35.1 pg — ABNORMAL HIGH (ref 26.0–34.0)
MCHC: 36.5 g/dL — ABNORMAL HIGH (ref 30.0–36.0)
MCV: 96 fL (ref 80.0–100.0)
Platelets: 168 10*3/uL (ref 150–400)
RBC: 4.25 MIL/uL (ref 4.22–5.81)
RDW: 12 % (ref 11.5–15.5)
WBC: 11.4 10*3/uL — ABNORMAL HIGH (ref 4.0–10.5)
nRBC: 0 % (ref 0.0–0.2)

## 2020-11-19 LAB — RESP PANEL BY RT-PCR (FLU A&B, COVID) ARPGX2
Influenza A by PCR: NEGATIVE
Influenza B by PCR: NEGATIVE
SARS Coronavirus 2 by RT PCR: NEGATIVE

## 2020-11-19 LAB — HIV ANTIBODY (ROUTINE TESTING W REFLEX): HIV Screen 4th Generation wRfx: NONREACTIVE

## 2020-11-19 MED ORDER — OXYCODONE HCL 5 MG PO TABS
5.0000 mg | ORAL_TABLET | Freq: Once | ORAL | Status: AC
  Administered 2020-11-19: 5 mg via ORAL
  Filled 2020-11-19: qty 1

## 2020-11-19 MED ORDER — ONDANSETRON HCL 4 MG PO TABS: 4.0000 mg | ORAL_TABLET | Freq: Four times a day (QID) | ORAL | Status: DC | PRN

## 2020-11-19 MED ORDER — ONDANSETRON HCL 4 MG/2ML IJ SOLN: 4.0000 mg | Freq: Four times a day (QID) | INTRAMUSCULAR | Status: DC | PRN

## 2020-11-19 MED ORDER — ACETAMINOPHEN 500 MG PO TABS
1000.0000 mg | ORAL_TABLET | Freq: Once | ORAL | Status: AC
  Administered 2020-11-19: 1000 mg via ORAL
  Filled 2020-11-19: qty 2

## 2020-11-19 MED ORDER — CHLORDIAZEPOXIDE HCL 25 MG PO CAPS
50.0000 mg | ORAL_CAPSULE | Freq: Three times a day (TID) | ORAL | Status: DC
  Administered 2020-11-19 – 2020-11-20 (×4): 50 mg via ORAL
  Filled 2020-11-19 (×4): qty 2

## 2020-11-19 MED ORDER — TRAZODONE HCL 50 MG PO TABS: 25.0000 mg | ORAL_TABLET | Freq: Every evening | ORAL | Status: DC | PRN

## 2020-11-19 MED ORDER — IOHEXOL 350 MG/ML SOLN
75.0000 mL | Freq: Once | INTRAVENOUS | Status: AC | PRN
  Administered 2020-11-19: 75 mL via INTRAVENOUS
  Filled 2020-11-19: qty 75

## 2020-11-19 MED ORDER — MORPHINE SULFATE (PF) 4 MG/ML IV SOLN
4.0000 mg | Freq: Once | INTRAVENOUS | Status: AC
  Administered 2020-11-19: 4 mg via INTRAVENOUS
  Filled 2020-11-19: qty 1

## 2020-11-19 MED ORDER — ACETAMINOPHEN 325 MG PO TABS: 650.0000 mg | ORAL_TABLET | Freq: Four times a day (QID) | ORAL | Status: DC | PRN

## 2020-11-19 MED ORDER — THIAMINE HCL 100 MG/ML IJ SOLN
Freq: Once | INTRAVENOUS | Status: AC
  Filled 2020-11-19: qty 1000

## 2020-11-19 MED ORDER — ONDANSETRON HCL 4 MG/2ML IJ SOLN
4.0000 mg | Freq: Once | INTRAMUSCULAR | Status: AC
  Administered 2020-11-19: 4 mg via INTRAVENOUS
  Filled 2020-11-19: qty 2

## 2020-11-19 MED ORDER — MAGNESIUM HYDROXIDE 400 MG/5ML PO SUSP: 30.0000 mL | Freq: Every day | ORAL | Status: DC | PRN

## 2020-11-19 MED ORDER — LORAZEPAM 1 MG PO TABS: 1.0000 mg | ORAL_TABLET | ORAL | Status: DC | PRN

## 2020-11-19 MED ORDER — ACETAMINOPHEN 325 MG RE SUPP: 650.0000 mg | Freq: Four times a day (QID) | RECTAL | Status: DC | PRN

## 2020-11-19 MED ORDER — DICYCLOMINE HCL 10 MG PO CAPS: 10.0000 mg | ORAL_CAPSULE | Freq: Three times a day (TID) | ORAL | Status: DC | PRN

## 2020-11-19 MED ORDER — LACTATED RINGERS IV SOLN: INTRAVENOUS | Status: DC

## 2020-11-19 MED ORDER — LACTATED RINGERS IV BOLUS
1000.0000 mL | Freq: Once | INTRAVENOUS | Status: AC
  Administered 2020-11-19: 1000 mL via INTRAVENOUS

## 2020-11-19 MED ORDER — FENTANYL CITRATE PF 50 MCG/ML IJ SOSY
50.0000 ug | PREFILLED_SYRINGE | INTRAMUSCULAR | Status: DC | PRN
Start: 2020-11-19 — End: 2020-11-20
  Administered 2020-11-19 – 2020-11-20 (×6): 50 ug via INTRAVENOUS
  Filled 2020-11-19 (×6): qty 1

## 2020-11-19 MED ORDER — LORAZEPAM 2 MG/ML IJ SOLN: 1.0000 mg | INTRAMUSCULAR | Status: DC | PRN

## 2020-11-19 MED ORDER — ENOXAPARIN SODIUM 40 MG/0.4ML IJ SOSY: 40.0000 mg | PREFILLED_SYRINGE | INTRAMUSCULAR | Status: DC

## 2020-11-19 NOTE — H&P (Signed)
[place]   PATIENT NAME: Cameron Santiago    MR#:  [id number]  DATE OF BIRTH:  November 14, 1995  DATE OF ADMISSION:  11/19/2020  PRIMARY CARE PHYSICIAN: [facility] [place]   Patient is coming from: Home  REQUESTING/REFERRING PHYSICIAN: Alfred Levins, [place], MD  CHIEF COMPLAINT:   Chief Complaint  Patient presents with   LLQ pain    HISTORY OF PRESENT ILLNESS:  Cameron Santiago is a 25 y.o. male with medical history significant for ongoing alcohol abuse, who presented to emergency room with acute onset of epigastric and left upper quadrant sharp abdominal pain with associated nausea and vomiting.  He described his pain as crampy and his diarrhea is watery.  No fever or chills.  No chest pain or palpitations.  No cough or wheezing or dyspnea.  He admits to drinking large amount of alcohol on a daily basis for the last several months.  He denies any bilious vomitus or hematemesis, melena or bright red blood per rectum.  ED Course: When he came to the ER blood pressure was 165/85 with otherwise normal vital signs.  Labs revealed borderline potassium of 3.5 and lipase level of 413 with AST of 223 and ALT 120 and alk phos was 105.  Total bili was 2.9.  CBC was unremarkable.  It was antigens and COVID-19 PCR came back negative.  UA showed 80 ketones and specific gravity 1032.  Imaging: Abdominal pelvic CT scan showed the following: Acute edematous/interstitial pancreatitis involving the tail the pancreas. No evidence of a pancreatic or peripancreatic necrosis.   Progressive severe hepatic steatosis and moderate hepatomegaly.   Right upper quadrant ultrasound revealed normal gallbladder with no evidence for cholecystitis.  It showed moderate to severe hepatic steatosis and hepatomegaly.  Patient was given Tylenol, 50 mcg of IV fentanyl, Librium, 1 L bolus of IV lactated Ringer followed by 125 mill per hour, 4 mg IV morphine sulfate x3 and oxycodone 5 mg p.o. he was  still having pain.  Will therefore be admitted to a medical bed for further evaluation and management.  PAST MEDICAL HISTORY:    Ongoing alcohol abuse  PAST SURGICAL HISTORY:   Past Surgical History:  Procedure Laterality Date   APPENDECTOMY     LAPAROSCOPIC APPENDECTOMY N/A 02/15/2015   Procedure: APPENDECTOMY LAPAROSCOPIC;  Surgeon: Sherri Rad, MD;  Location: [facility] ORS;  Service: General;  Laterality: N/A;   TONSILLECTOMY      SOCIAL HISTORY:   Social History   Tobacco Use   Smoking status: Never   Smokeless tobacco: Never  Substance Use Topics   Alcohol use: No  Positive for ongoing heavy alcohol abuse.  FAMILY HISTORY:  No family history on file. He denies any familial diseases. DRUG ALLERGIES:  No Known Allergies  REVIEW OF SYSTEMS:   ROS As per history of present illness. All pertinent systems were reviewed above. Constitutional, HEENT, cardiovascular, respiratory, GI, GU, musculoskeletal, neuro, psychiatric, endocrine, integumentary and hematologic systems were reviewed and are otherwise negative/unremarkable except for positive findings mentioned above in the HPI.   MEDICATIONS AT HOME:   Prior to Admission medications   Medication Sig Start Date End Date Taking? Authorizing Provider  albuterol (VENTOLIN HFA) 108 (90 Base) MCG/ACT inhaler Inhale 2 puffs into the lungs every 6 (six) hours as needed for wheezing or shortness of breath. Patient not taking: Reported on 11/19/2020 11/22/19   Marlana Salvage, PA  dicyclomine (BENTYL) 10 MG capsule Take 1 capsule (10 mg total) by  mouth 3 (three) times daily as needed for spasms. 08/27/20   Duffy Bruce, MD  ibuprofen (ADVIL) 600 MG tablet Take 1 tablet (600 mg total) by mouth every 8 (eight) hours as needed for moderate pain. Patient not taking: Reported on 11/19/2020 08/27/20   Duffy Bruce, MD  ondansetron (ZOFRAN ODT) 4 MG disintegrating tablet Take 1 tablet (4 mg total) by mouth every 8 (eight) hours as needed for  nausea or vomiting. Patient not taking: Reported on 11/19/2020 08/27/20   Duffy Bruce, MD  ondansetron (ZOFRAN) 4 MG tablet Take 1 tablet (4 mg total) by mouth daily as needed for nausea or vomiting. Patient not taking: Reported on 11/19/2020 11/22/19 11/21/20  Marlana Salvage, PA      VITAL SIGNS:  Blood pressure (!) 161/110, pulse 72, temperature 98 F (36.7 C), resp. rate 18, SpO2 99 %.  PHYSICAL EXAMINATION:  Physical Exam  GENERAL:  25 y.o.-year-old male patient lying in the bed with no acute distress.  EYES: Pupils equal, round, reactive to light and accommodation. No scleral icterus. Extraocular muscles intact.  HEENT: Head atraumatic, normocephalic. Oropharynx and nasopharynx clear.  NECK:  Supple, no jugular venous distention. No thyroid enlargement, no tenderness.  LUNGS: Normal breath sounds bilaterally, no wheezing, rales,rhonchi or crepitation. No use of accessory muscles of respiration.  CARDIOVASCULAR: Regular rate and rhythm, S1, S2 normal. No murmurs, rubs, or gallops.  ABDOMEN: Soft, nondistended, with epigastric and left upper quadrant tenderness without rebound tenderness guarding or rigidity.. Bowel sounds present. No organomegaly or mass.  EXTREMITIES: No pedal edema, cyanosis, or clubbing.  NEUROLOGIC: Cranial nerves II through XII are intact. Muscle strength 5/5 in all extremities. Sensation intact. Gait not checked.  PSYCHIATRIC: The patient is alert and oriented x 3.  Normal affect and good eye contact. SKIN: No obvious rash, lesion, or ulcer.   LABORATORY PANEL:   CBC Recent Labs  Lab 11/18/20 1758  WBC 8.4  HGB 15.6  HCT 42.7  PLT 171   ------------------------------------------------------------------------------------------------------------------  Chemistries  Recent Labs  Lab 11/18/20 1758  NA 136  K 3.5  CL 96*  CO2 26  GLUCOSE 113*  BUN 7  CREATININE 0.72  CALCIUM 8.8*  AST 223*  ALT 120*  ALKPHOS 105  BILITOT 2.9*    ------------------------------------------------------------------------------------------------------------------  Cardiac Enzymes No results for input(s): TROPONINI in the last 168 hours. ------------------------------------------------------------------------------------------------------------------  RADIOLOGY:  CT ABDOMEN PELVIS W CONTRAST  Result Date: 11/19/2020 CLINICAL DATA:  Unspecified abdominal pain, left lower quadrant abdominal pain, nausea, vomiting. EXAM: CT ABDOMEN AND PELVIS WITH CONTRAST TECHNIQUE: Multidetector CT imaging of the abdomen and pelvis was performed using the standard protocol following bolus administration of intravenous contrast. CONTRAST:  28m OMNIPAQUE IOHEXOL 350 MG/ML SOLN COMPARISON:  12/31/2018 FINDINGS: Lower chest: Visualized lung bases are clear. The visualized heart and pericardium are unremarkable. Hepatobiliary: Severe hepatic steatosis and moderate hepatomegaly has progressed in the interval since the prior examination with the liver measuring almost 27 cm in craniocaudal dimension within the mid axillary line. No enhancing intrahepatic mass identified. No intra or extrahepatic biliary ductal dilation. Relatively high attenuation fluid within the gallbladder likely represents inspissated bile. Pancreas: There is mild peripancreatic inflammatory stranding involving the tail of the pancreas in keeping with acute edematous/interstitial pancreatitis. Normal enhancement of the pancreatic parenchyma. The pancreatic duct is not dilated. No parenchymal calcifications are seen. No peripancreatic fluid collections are identified. Spleen: Unremarkable Adrenals/Urinary Tract: Adrenal glands are unremarkable. Kidneys are normal, without renal calculi, focal lesion, or hydronephrosis. Bladder  is unremarkable. Stomach/Bowel: The stomach, small bowel, and large bowel are unremarkable. Appendectomy has been performed. No free intraperitoneal gas or fluid.  Vascular/Lymphatic: No significant vascular findings are present. No enlarged abdominal or pelvic lymph nodes. Reproductive: Prostate is unremarkable. Other: No abdominal wall hernia.  Rectum unremarkable. Musculoskeletal: No acute or significant osseous findings. IMPRESSION: Acute edematous/interstitial pancreatitis involving the tail the pancreas. No evidence of a pancreatic or peripancreatic necrosis. Progressive severe hepatic steatosis and moderate hepatomegaly. Electronically Signed   By: Fidela Salisbury M.D.   On: 11/19/2020 01:59   US ABDOMEN LIMITED RUQ (LIVER/GB)  Result Date: 11/19/2020 CLINICAL DATA:  Pancreatitis EXAM: ULTRASOUND ABDOMEN LIMITED RIGHT UPPER QUADRANT COMPARISON:  None. FINDINGS: Gallbladder: No gallstones or wall thickening visualized. No sonographic Murphy sign noted by sonographer. Common bile duct: Diameter: 4 mm in proximal diameter Liver: Mild to moderate hepatomegaly. The hepatic parenchyma is diffusely echogenic and there is poor acoustic through transmission in keeping with changes of moderate to severe hepatic steatosis. No focal intrahepatic masses are seen. There is no intrahepatic biliary ductal dilation. Portal vein is patent on color Doppler imaging with normal direction of blood flow towards the liver. Other: None. IMPRESSION: Normal gallbladder; no evidence of cholecystitis Moderate to severe hepatic steatosis and hepatomegaly. Electronically Signed   By: Fidela Salisbury M.D.   On: 11/19/2020 03:40      IMPRESSION AND PLAN:  Active Problems:   Alcoholic pancreatitis  1.  Acute alcoholic pancreatitis with associated alcohol abuse. - The patient be admitted to a medical bed. - He will be kept NPO. - We will follow serial lipase levels. - Pain management will be provided. - He was counseled for alcohol cessation and is willing to stop.  2.  Elevated LFTs likely secondary to alcoholic hepatitis. - We will follow LFTs with hydration.  3.  Suspected alcohol  withdrawal. - He will be placed on CIWA protocol.  DVT prophylaxis: Lovenox.   Code Status: full code.  Family Communication:  The plan of care was discussed in details with the patient (and family). I answered all questions. The patient agreed to proceed with the above mentioned plan. Further management will depend upon hospital course. Disposition Plan: Back to previous home environment Consults called: none.  All the records are reviewed and case discussed with ED provider.  Status is: Inpatient  Remains inpatient appropriate because:Ongoing active pain requiring inpatient pain management, Altered mental status, Ongoing diagnostic testing needed not appropriate for outpatient work up, Unsafe d/c plan, IV treatments appropriate due to intensity of illness or inability to take PO, and Inpatient level of care appropriate due to severity of illness  Dispo: The patient is from: Home              Anticipated d/c is to: Home              Patient currently is not medically stable to d/c.   Difficult to place patient No      TOTAL TIME TAKING CARE OF THIS PATIENT: 55 minutes.    Christel Mormon M.D on 11/19/2020 at 7:06 AM  [facility]   From 7 PM-7 AM, contact night-coverage [email or web address]  CC: Primary care physician; [facility] [place]

## 2020-11-19 NOTE — ED Notes (Signed)
Pt alert, iv infusing.  Pt on cell phone.  Pt waiting on admission bed

## 2020-11-19 NOTE — ED Notes (Signed)
Pt awaiting US

## 2020-11-19 NOTE — Progress Notes (Signed)
Patient ID: Cameron Santiago, male   DOB: 25-Jan-1996, 25 y.o.   MRN: [id number] Patient seen and examined.  H&P reviewed.  This is a no charge note as patient was admitted this AM.   Cameron Santiago is a 25 y.o. male with medical history significant for ongoing alcohol abuse, who presented to emergency room with acute onset of epigastric and left upper quadrant sharp abdominal pain with associated nausea and vomiting.  He described his pain as crampy and his diarrhea is watery.  No fever or chills.  No chest pain or palpitations.  No cough or wheezing or dyspnea.  He admits to drinking large amount of alcohol on a daily basis for the last several months.  He denies any bilious vomitus or hematemesis, melena or bright red blood per rectum.   ED Course: When he came to the ER blood pressure was 165/85 with otherwise normal vital signs.  Labs revealed borderline potassium of 3.5 and lipase level of 413 with AST of 223 and ALT 120 and alk phos was 105.  Total bili was 2.9.  CBC was unremarkable.  It was antigens and COVID-19 PCR came back negative.  UA showed 80 ketones and specific gravity 1032.   Imaging: Abdominal pelvic CT scan showed the following: Acute edematous/interstitial pancreatitis involving the tail the pancreas. No evidence of a pancreatic or peripancreatic necrosis.   Progressive severe hepatic steatosis and moderate hepatomegaly.   Right upper quadrant ultrasound revealed normal gallbladder with no evidence for cholecystitis.  It showed moderate to severe hepatic steatosis and hepatomegaly.  This am c/o abd pain. Unable to eat his food  Nad, calm Cta no w/r/r Abd soft TTP epigastric area No edema  A/P Acute pancreatitis-2/2 alcohol abuse Counseled pt about alcohol cessation Change diet to clears  Pain control ivf

## 2020-11-19 NOTE — ED Provider Notes (Signed)
[facility] Emergency Department Provider Note  ____________________________________________  Time seen: Approximately 1:07 AM  I have reviewed the triage vital signs and the nursing notes.   HISTORY  Chief Complaint LLQ pain   HPI Cameron Santiago is a 25 y.o. male with a history of alcohol abuse who presents for evaluation of abdominal pain.  Patient reports 2 days of diffuse left-sided abdominal pain, nausea, vomiting, and diarrhea.  Patient reports drinking large volume of alcohol on a daily basis for several months.  Denies any history of cirrhosis.  Denies hematochezia, hematemesis, melena.  He reports that the pain is sharp, constant, severe.  Denies any fever or chills, chest pain or shortness of breath, dysuria or hematuria.  Denies ever having similar pain   History reviewed. No pertinent past medical history.  Patient Active Problem List   Diagnosis Date Noted   Acute appendicitis 02/14/2015    Past Surgical History:  Procedure Laterality Date   APPENDECTOMY     LAPAROSCOPIC APPENDECTOMY N/A 02/15/2015   Procedure: APPENDECTOMY LAPAROSCOPIC;  Surgeon: Natale Lay, MD;  Location: [facility] ORS;  Service: General;  Laterality: N/A;   TONSILLECTOMY      Prior to Admission medications   Medication Sig Start Date End Date Taking? Authorizing Provider  albuterol (VENTOLIN HFA) 108 (90 Base) MCG/ACT inhaler Inhale 2 puffs into the lungs every 6 (six) hours as needed for wheezing or shortness of breath. Patient not taking: Reported on 11/19/2020 11/22/19   Lucy Chris, PA  dicyclomine (BENTYL) 10 MG capsule Take 1 capsule (10 mg total) by mouth 3 (three) times daily as needed for spasms. 08/27/20   Shaune Pollack, MD  ibuprofen (ADVIL) 600 MG tablet Take 1 tablet (600 mg total) by mouth every 8 (eight) hours as needed for moderate pain. Patient not taking: Reported on 11/19/2020 08/27/20   Shaune Pollack, MD  ondansetron (ZOFRAN ODT) 4 MG  disintegrating tablet Take 1 tablet (4 mg total) by mouth every 8 (eight) hours as needed for nausea or vomiting. Patient not taking: Reported on 11/19/2020 08/27/20   Shaune Pollack, MD  ondansetron (ZOFRAN) 4 MG tablet Take 1 tablet (4 mg total) by mouth daily as needed for nausea or vomiting. Patient not taking: Reported on 11/19/2020 11/22/19 11/21/20  Lucy Chris, PA    Allergies Patient has no known allergies.  No family history on file.  Social History Social History   Tobacco Use   Smoking status: Never   Smokeless tobacco: Never  Substance Use Topics   Alcohol use: No   Drug use: Never    Review of Systems  Constitutional: Negative for fever. Eyes: Negative for visual changes. ENT: Negative for sore throat. Neck: No neck pain  Cardiovascular: Negative for chest pain. Respiratory: Negative for shortness of breath. Gastrointestinal: + L sided abdominal pain, vomiting and diarrhea. Genitourinary: Negative for dysuria. Musculoskeletal: Negative for back pain. Skin: Negative for rash. Neurological: Negative for headaches, weakness or numbness. Psych: No SI or HI  ____________________________________________   PHYSICAL EXAM:  VITAL SIGNS: ED Triage Vitals  Enc Vitals Group     BP 11/18/20 1802 (!) 165/85     Pulse Rate 11/18/20 1802 85     Resp 11/18/20 1802 19     Temp 11/18/20 1802 98 F (36.7 C)     Temp src --      SpO2 11/18/20 1802 100 %     Weight --      Height --  Head Circumference --      Peak Flow --      Pain Score 11/18/20 1757 10     Pain Loc --      Pain Edu? --      Excl. in GC? --     Constitutional: Alert and oriented, pale, clammy, in significant pain. HEENT:      Head: Normocephalic and atraumatic.         Eyes: Conjunctivae are normal. Sclera is non-icteric.       Mouth/Throat: Mucous membranes are moist.       Neck: Supple with no signs of meningismus. Cardiovascular: Regular rate and rhythm. No murmurs, gallops, or  rubs. 2+ symmetrical distal pulses are present in all extremities. No JVD. Respiratory: Normal respiratory effort. Lungs are clear to auscultation bilaterally.  Gastrointestinal: Soft, diffusely tender to palpation on the left quadrants and epigastric region, and non distended with positive bowel sounds. No rebound or guarding. Genitourinary: No CVA tenderness. Musculoskeletal:  No edema, cyanosis, or erythema of extremities. Neurologic: Normal speech and language. Face is symmetric. Moving all extremities. No gross focal neurologic deficits are appreciated. Skin: Skin is warm, dry and intact. No rash noted. Psychiatric: Mood and affect are normal. Speech and behavior are normal.  ____________________________________________   LABS (all labs ordered are listed, but only abnormal results are displayed)  Labs Reviewed  LIPASE, BLOOD - Abnormal; Notable for the following components:      Result Value   Lipase 413 (*)    All other components within normal limits  COMPREHENSIVE METABOLIC PANEL - Abnormal; Notable for the following components:   Chloride 96 (*)    Glucose, Bld 113 (*)    Calcium 8.8 (*)    Total Protein 8.5 (*)    AST 223 (*)    ALT 120 (*)    Total Bilirubin 2.9 (*)    All other components within normal limits  CBC - Abnormal; Notable for the following components:   MCH 35.5 (*)    MCHC 36.5 (*)    All other components within normal limits  URINALYSIS, COMPLETE (UACMP) WITH MICROSCOPIC - Abnormal; Notable for the following components:   Color, Urine YELLOW (*)    APPearance CLEAR (*)    Specific Gravity, Urine 1.032 (*)    Ketones, ur 80 (*)    All other components within normal limits  RESP PANEL BY RT-PCR (FLU A&B, COVID) [id number]   ____________________________________________  EKG  none  ____________________________________________  RADIOLOGY  I have personally reviewed the images performed during this visit and I agree with the Radiologist's  read.   Interpretation by Radiologist:  CT ABDOMEN PELVIS W CONTRAST  Result Date: 11/19/2020 CLINICAL DATA:  Unspecified abdominal pain, left lower quadrant abdominal pain, nausea, vomiting. EXAM: CT ABDOMEN AND PELVIS WITH CONTRAST TECHNIQUE: Multidetector CT imaging of the abdomen and pelvis was performed using the standard protocol following bolus administration of intravenous contrast. CONTRAST:  85mL OMNIPAQUE IOHEXOL 350 MG/ML SOLN COMPARISON:  12/31/2018 FINDINGS: Lower chest: Visualized lung bases are clear. The visualized heart and pericardium are unremarkable. Hepatobiliary: Severe hepatic steatosis and moderate hepatomegaly has progressed in the interval since the prior examination with the liver measuring almost 27 cm in craniocaudal dimension within the mid axillary line. No enhancing intrahepatic mass identified. No intra or extrahepatic biliary ductal dilation. Relatively high attenuation fluid within the gallbladder likely represents inspissated bile. Pancreas: There is mild peripancreatic inflammatory stranding involving the tail of the pancreas in keeping with acute  edematous/interstitial pancreatitis. Normal enhancement of the pancreatic parenchyma. The pancreatic duct is not dilated. No parenchymal calcifications are seen. No peripancreatic fluid collections are identified. Spleen: Unremarkable Adrenals/Urinary Tract: Adrenal glands are unremarkable. Kidneys are normal, without renal calculi, focal lesion, or hydronephrosis. Bladder is unremarkable. Stomach/Bowel: The stomach, small bowel, and large bowel are unremarkable. Appendectomy has been performed. No free intraperitoneal gas or fluid. Vascular/Lymphatic: No significant vascular findings are present. No enlarged abdominal or pelvic lymph nodes. Reproductive: Prostate is unremarkable. Other: No abdominal wall hernia.  Rectum unremarkable. Musculoskeletal: No acute or significant osseous findings. IMPRESSION: Acute  edematous/interstitial pancreatitis involving the tail the pancreas. No evidence of a pancreatic or peripancreatic necrosis. Progressive severe hepatic steatosis and moderate hepatomegaly. Electronically Signed   By: Helyn Numbers M.D.   On: 11/19/2020 01:59   US ABDOMEN LIMITED RUQ (LIVER/GB)  Result Date: 11/19/2020 CLINICAL DATA:  Pancreatitis EXAM: ULTRASOUND ABDOMEN LIMITED RIGHT UPPER QUADRANT COMPARISON:  None. FINDINGS: Gallbladder: No gallstones or wall thickening visualized. No sonographic Murphy sign noted by sonographer. Common bile duct: Diameter: 4 mm in proximal diameter Liver: Mild to moderate hepatomegaly. The hepatic parenchyma is diffusely echogenic and there is poor acoustic through transmission in keeping with changes of moderate to severe hepatic steatosis. No focal intrahepatic masses are seen. There is no intrahepatic biliary ductal dilation. Portal vein is patent on color Doppler imaging with normal direction of blood flow towards the liver. Other: None. IMPRESSION: Normal gallbladder; no evidence of cholecystitis Moderate to severe hepatic steatosis and hepatomegaly. Electronically Signed   By: Helyn Numbers M.D.   On: 11/19/2020 03:40     ____________________________________________   PROCEDURES  Procedure(s) performed: None Procedures   Critical Care performed:  None ____________________________________________   INITIAL IMPRESSION / ASSESSMENT AND PLAN / ED COURSE   25 y.o. male with a history of alcohol abuse who presents for evaluation of abdominal pain, nausea, vomiting, diarrhea x 2 days.  Patient looks extremely uncomfortable, pale, moaning in pain, his abdomen is diffusely tender in the epigastric and left quadrants with no rebound or guarding.  Vitals are stable.  Ddx alcoholic gastritis, pancreatitis, gallbladder pathology, peptic ulcer disease, diverticular disease, gastroenteritis.  Labs show normal white count, no anemia, LFTs are elevated with AST  of 223 and ALT of 120, T bili of 2.9, lipase of 413.  We will treat symptoms with IV morphine, IV fluids and IV Zofran.  We will send patient for CT scan.  Old medical records reviewed.  _________________________ 4:37 AM on 11/19/2020 ----------------------------------------- CT consistent with pancreatitis. US showing no signs of gallstone pancreatitis. Patient has received 3 rounds of narcotic pain medication and still has intractable pain and nausea and therefore will be admitted to the Hospitalist service. No signs of complicated withdrawals. Patient placed on CIWA protocol and will start librium taper to prevent complicated withdrawals.        _____________________________________________ Please note:  Patient was evaluated in Emergency Department today for the symptoms described in the history of present illness. Patient was evaluated in the context of the global COVID-19 pandemic, which necessitated consideration that the patient might be at risk for infection with the SARS-CoV-2 virus that causes COVID-19. Institutional protocols and algorithms that pertain to the evaluation of patients at risk for COVID-19 are in a state of rapid change based on information released by regulatory bodies including the CDC and federal and state organizations. These policies and algorithms were followed during the patient's care in the ED.  Some ED evaluations  and interventions may be delayed as a result of limited staffing during the pandemic.   [place] Controlled Substance Database was reviewed by me. ____________________________________________   FINAL CLINICAL IMPRESSION(S) / ED DIAGNOSES   Final diagnoses:  Pancreatitis  Alcohol-induced acute pancreatitis without infection or necrosis  Intractable pain  Intractable nausea and vomiting      NEW MEDICATIONS STARTED DURING THIS VISIT:  ED Discharge Orders     None        Note:  This document was prepared using Dragon voice recognition  software and may include unintentional dictation errors.    Don Perking, [place], MD 11/19/20 [phone]

## 2020-11-19 NOTE — TOC Initial Note (Signed)
Transition of Care [facility]) - Initial/Assessment Note    Patient Details  Name: Cameron Santiago MRN: [id number] Date of Birth: [birth year]  [facility]) CM/SW Contact:    Marina Goodell Phone Number: [phone] 11/19/2020, 3:32 PM  Clinical Narrative:                  Patient presents to [facility] due to LLQ pain since 11/18/2020, with associated nausea, vomiting, and diarrhea. Patient is independent in all ADLs and lives with parents. CSW gave patient Substance Abuse resources list and information and encouraged patient to contact providers on the list.  Patient stated he was okay and he could stop cold [place], "especially after this time." Patient stated he did not have other needs. TOC consult completed.  Expected Discharge Plan: Home/Self Care Barriers to Discharge: Continued Medical Work up, Inadequate or no insurance   Patient Goals and CMS Choice Patient states their goals for this hospitalization and ongoing recovery are:: Patient stated he would stop cold [place] and he would be fine.      Expected Discharge Plan and Services Expected Discharge Plan: Home/Self Care     Post Acute Care Choice: NA Living arrangements for the past 2 months: Single Family Home                                      Prior Living Arrangements/Services Living arrangements for the past 2 months: Single Family Home Lives with:: Parents Patient language and need for interpreter reviewed:: Yes        Need for Family Participation in Patient Care: No (Comment) Care giver support system in place?: No (comment)   Criminal Activity/Legal Involvement Pertinent to Current Situation/Hospitalization: No - Comment as needed  Activities of Daily Living      Permission Sought/Granted                  Emotional Assessment Appearance:: Appears stated age Attitude/Demeanor/Rapport: Engaged Affect (typically observed): Stable Orientation: : Oriented to  Time, Oriented to  Place, Oriented to Self, Oriented to Situation Alcohol / Substance Use: Alcohol Use Psych Involvement: No (comment)  Admission diagnosis:  Alcoholic pancreatitis [K85.20] Patient Active Problem List   Diagnosis Date Noted   Alcoholic pancreatitis 11/19/2020   Acute appendicitis 02/14/2015   PCP:  [facility]:   [facility] [phone] - [place], [place] - [address] [place] [address] Phone: [phone] Fax: [phone]     Social Determinants of Health (SDOH) Interventions    Readmission Risk Interventions No flowsheet data found.

## 2020-11-20 DIAGNOSIS — K7010 Alcoholic hepatitis without ascites: Secondary | ICD-10-CM

## 2020-11-20 DIAGNOSIS — F1010 Alcohol abuse, uncomplicated: Secondary | ICD-10-CM

## 2020-11-20 LAB — BASIC METABOLIC PANEL
Anion gap: 13 (ref 5–15)
BUN: <5 mg/dL — ABNORMAL LOW (ref 6–20)
CO2: 28 mmol/L (ref 22–32)
Calcium: 8.5 mg/dL — ABNORMAL LOW (ref 8.9–10.3)
Chloride: 96 mmol/L — ABNORMAL LOW (ref 98–111)
Creatinine, Ser: 0.62 mg/dL (ref 0.61–1.24)
GFR, Estimated: >60 mL/min (ref >60)
Glucose, Bld: 96 mg/dL (ref 70–99)
Potassium: 3 mmol/L — ABNORMAL LOW (ref 3.5–5.1)
Sodium: 137 mmol/L (ref 135–145)

## 2020-11-20 LAB — LIPASE, BLOOD: Lipase: 235 U/L — ABNORMAL HIGH (ref 11–51)

## 2020-11-20 LAB — HEPATIC FUNCTION PANEL
ALT: 66 U/L — ABNORMAL HIGH (ref 0–44)
AST: 76 U/L — ABNORMAL HIGH (ref 15–41)
Albumin: 3.7 g/dL (ref 3.5–5.0)
Alkaline Phosphatase: 80 U/L (ref 38–126)
Bilirubin, Direct: 0.7 mg/dL — ABNORMAL HIGH (ref 0.0–0.2)
Indirect Bilirubin: 1.9 mg/dL — ABNORMAL HIGH (ref 0.3–0.9)
Total Bilirubin: 2.6 mg/dL — ABNORMAL HIGH (ref 0.3–1.2)
Total Protein: 7.2 g/dL (ref 6.5–8.1)

## 2020-11-20 LAB — CBC
HCT: 41.2 % (ref 39.0–52.0)
Hemoglobin: 14.5 g/dL (ref 13.0–17.0)
MCH: 33.7 pg (ref 26.0–34.0)
MCHC: 35.2 g/dL (ref 30.0–36.0)
MCV: 95.8 fL (ref 80.0–100.0)
Platelets: 167 10*3/uL (ref 150–400)
RBC: 4.3 MIL/uL (ref 4.22–5.81)
RDW: 11.9 % (ref 11.5–15.5)
WBC: 7.3 10*3/uL (ref 4.0–10.5)
nRBC: 0 % (ref 0.0–0.2)

## 2020-11-20 MED ORDER — POTASSIUM CHLORIDE CRYS ER 20 MEQ PO TBCR
40.0000 meq | EXTENDED_RELEASE_TABLET | Freq: Once | ORAL | Status: AC
  Administered 2020-11-20: 40 meq via ORAL
  Filled 2020-11-20: qty 2

## 2020-11-20 NOTE — ED Notes (Signed)
Pt provided with water per request. Pt ambulated to BR with steady gait.

## 2020-11-20 NOTE — Discharge Summary (Signed)
Cameron Santiago:[id number] DOB: 18-May-1995 DOA: 11/19/2020  PCP[facility] [place] date: 11/19/2020 Discharge date: 11/20/2020  Admitted From: Home Disposition: Home  Recommendations for Outpatient Follow-up:  Follow up with PCP in 1 week Please obtain BMP/CBC in one week      Discharge Condition:Stable CODE STATUS: Full Diet recommendation: Low-fat diet.  No alcohol consumption    Brief/Interim Summary: Per MHD:[id number] Cameron Santiago is Cameron 25 y.o. male with medical history significant for ongoing alcohol abuse, who presented to emergency room with acute onset of epigastric and left upper quadrant sharp abdominal pain with associated nausea and vomiting.  He described his pain as crampy and his diarrhea is watery.  No fever or chills.  No chest pain or palpitations.  No cough or wheezing or dyspnea.  He admits to drinking large amount of alcohol on Cameron daily basis for the last several months.  He denies any bilious vomitus or hematemesis, melena or bright red blood per rectum.  Upon presentation   Labs revealed borderline potassium of 3.5 and lipase level of 413 with AST of 223 and ALT 120 and alk phos was 105.  Total bili was 2.9.  CBC was unremarkable.  It was antigens and COVID-19 PCR came back negative.  Had abdominal CT with full results below by revealing Acute edematous/interstitial pancreatitis involving the tail the pancreas.No evidence of Cameron pancreatic or peripancreatic necrosis.  Progressive severe hepatic steatosis and moderate hepatomegaly. Obtained right upper quadrant ultrasound revealed normal gallbladder with no evidence for cholecystitis.  It showed moderate to severe hepatic steatosis and hepatomegaly.  He was admitted to the hospital.  Was started on IV fluids aggressively for hydration.  He was initially n.p.o. and once his pain was tolerable he was started on clears and he has tolerated advancing diet to soft.  He was instructed to abstain from  alcohol use.  He was told to continue soft diet for 2 more days on discharge as his pain had improved and then advance diet as tolerated.  Instructed to abstain from fatty food. During his hospitalization he was placed on alcohol withdrawal protocol but he did not score.  He was counseled extensively on alcohol cessation.   1.  Acute alcoholic pancreatitis with associated alcohol abuse. - The patient be admitted to Cameron medical bed. -Started on IV fluids for aggressive hydration Pain improved tolerating diet today Lipase trended down - He was counseled for alcohol cessation and is willing to stop.  2.  Elevated LFTs likely secondary to alcoholic hepatitis. Improved with hydration LFTs trended down  3.  Alcohol abuse - He will be placed on CIWA protocol, but did not score     Discharge Diagnoses:  Active Problems:   Alcoholic pancreatitis    Discharge Instructions  Discharge Instructions     Call MD for:  severe uncontrolled pain   Complete by: As directed    Diet - low sodium heart healthy   Complete by: As directed    Discharge instructions   Complete by: As directed    Eat soft food next 2 day before eating solids. Avoid alcohol and fatty foods Stop drinking.   Increase activity slowly   Complete by: As directed       Allergies as of 11/20/2020   No Known Allergies      Medication List     STOP taking these medications    dicyclomine 10 MG capsule Commonly known as: Bentyl   ibuprofen 600 MG  tablet Commonly known as: ADVIL   ondansetron 4 MG disintegrating tablet Commonly known as: Zofran ODT   ondansetron 4 MG tablet Commonly known as: Zofran       TAKE these medications    albuterol 108 (90 Base) MCG/ACT inhaler Commonly known as: VENTOLIN HFA Inhale 2 puffs into the lungs every 6 (six) hours as needed for wheezing or shortness of breath.        Follow-up [facility] [place] Follow up in 1 week(s).    Specialty: General Practice Contact information: [place] [address] [phone]                No Known Allergies  Consultations:    Procedures/Studies: CT ABDOMEN PELVIS W CONTRAST  Result Date: 11/19/2020 CLINICAL DATA:  Unspecified abdominal pain, left lower quadrant abdominal pain, nausea, vomiting. EXAM: CT ABDOMEN AND PELVIS WITH CONTRAST TECHNIQUE: Multidetector CT imaging of the abdomen and pelvis was performed using the standard protocol following bolus administration of intravenous contrast. CONTRAST:  79m OMNIPAQUE IOHEXOL 350 MG/ML SOLN COMPARISON:  12/31/2018 FINDINGS: Lower chest: Visualized lung bases are clear. The visualized heart and pericardium are unremarkable. Hepatobiliary: Severe hepatic steatosis and moderate hepatomegaly has progressed in the interval since the prior examination with the liver measuring almost 27 cm in craniocaudal dimension within the mid axillary line. No enhancing intrahepatic mass identified. No intra or extrahepatic biliary ductal dilation. Relatively high attenuation fluid within the gallbladder likely represents inspissated bile. Pancreas: There is mild peripancreatic inflammatory stranding involving the tail of the pancreas in keeping with acute edematous/interstitial pancreatitis. Normal enhancement of the pancreatic parenchyma. The pancreatic duct is not dilated. No parenchymal calcifications are seen. No peripancreatic fluid collections are identified. Spleen: Unremarkable Adrenals/Urinary Tract: Adrenal glands are unremarkable. Kidneys are normal, without renal calculi, focal lesion, or hydronephrosis. Bladder is unremarkable. Stomach/Bowel: The stomach, small bowel, and large bowel are unremarkable. Appendectomy has been performed. No free intraperitoneal gas or fluid. Vascular/Lymphatic: No significant vascular findings are present. No enlarged abdominal or pelvic lymph nodes. Reproductive: Prostate is  unremarkable. Other: No abdominal wall hernia.  Rectum unremarkable. Musculoskeletal: No acute or significant osseous findings. IMPRESSION: Acute edematous/interstitial pancreatitis involving the tail the pancreas. No evidence of Cameron pancreatic or peripancreatic necrosis. Progressive severe hepatic steatosis and moderate hepatomegaly. Electronically Signed   By: AFidela SalisburyM.D.   On: 11/19/2020 01:59   [place]DOM[place] LIMITED RUQ (LIVER/GB)  Result Date: 11/19/2020 CLINICAL DATA:  Pancreatitis EXAM: ULTRASOUND ABDOMEN LIMITED RIGHT UPPER QUADRANT COMPARISON:  None. FINDINGS: Gallbladder: No gallstones or wall thickening visualized. No sonographic Murphy sign noted by sonographer. Common bile duct: Diameter: 4 mm in proximal diameter Liver: Mild to moderate hepatomegaly. The hepatic parenchyma is diffusely echogenic and there is poor acoustic through transmission in keeping with changes of moderate to severe hepatic steatosis. No focal intrahepatic masses are seen. There is no intrahepatic biliary ductal dilation. Portal vein is patent on color Doppler imaging with normal direction of blood flow towards the liver. Other: None. IMPRESSION: Normal gallbladder; no evidence of cholecystitis Moderate to severe hepatic steatosis and hepatomegaly. Electronically Signed   By: AFidela SalisburyM.D.   On: 11/19/2020 03:40      Subjective: Feels pain is better.  Would like to advance diet.  Discharge Exam: Vitals:   11/20/20 0927 11/20/20 1210  BP: (!) 162/109 (!) 158/94  Pulse: 80 81  Resp: 16 14  Temp: 98.6 F (37 C)  98.1 F (36.7 C)  SpO2: 99% 98%   Vitals:   11/20/20 0409 11/20/20 0630 11/20/20 0927 11/20/20 1210  BP:  (!) 144/92 (!) 162/109 (!) 158/94  Pulse:  82 80 81  Resp:  '18 16 14  ' Temp:   98.6 F (37 C) 98.1 F (36.7 C)  TempSrc:   Oral Oral  SpO2:  100% 99% 98%  Weight: 109.4 kg     Height: '5\' 11"'  (1.803 m)       General: Pt is alert, awake, not in acute distress Cardiovascular: RRR,  S1/S2 +, no rubs, no gallops Respiratory: CTA bilaterally, no wheezing, no rhonchi Abdominal: Soft, NT, minimal ttp bowel sounds + Extremities: no edema, no cyanosis    The results of significant diagnostics from this hospitalization (including imaging, microbiology, ancillary and laboratory) are listed below for reference.     Microbiology: Recent Results (from the past 240 hour(s))  Resp Panel by RT-PCR (Flu Cameron&B, Covid) Nasopharyngeal Swab     Status: None   Collection Time: 11/19/20  5:09 AM   Specimen: Nasopharyngeal Swab; Nasopharyngeal(NP) swabs in vial transport medium  Result Value Ref Range Status   SARS Coronavirus 2 by RT PCR NEGATIVE NEGATIVE Final    Comment: (NOTE) SARS-CoV-2 target nucleic acids are NOT DETECTED.  The SARS-CoV-2 RNA is generally detectable in upper respiratory specimens during the acute phase of infection. The lowest concentration of SARS-CoV-2 viral copies this assay can detect is 138 copies/mL. Cameron negative result does not preclude SARS-Cov-2 infection and should not be used as the sole basis for treatment or other patient management decisions. Cameron negative result may occur with  improper specimen collection/handling, submission of specimen other than nasopharyngeal swab, presence of viral mutation(s) within the areas targeted by this assay, and inadequate number of viral copies(<138 copies/mL). Cameron negative result must be combined with clinical observations, patient history, and epidemiological information. The expected result is Negative.  Fact Sheet for Patients:  EntrepreneurPulse.com.au  Fact Sheet for Healthcare Providers:  IncredibleEmployment.be  This test is no t yet approved or cleared by the [place] FDA and  has been authorized for detection and/or diagnosis of SARS-CoV-2 by FDA under an Emergency Use Authorization (EUA). This EUA will remain  in effect (meaning this test can be used) for the  duration of the COVID-19 declaration under Section 564(b)(1) of the Act, 21 U.S.C.section 360bbb-3(b)(1), unless the authorization is terminated  or revoked sooner.       Influenza Cameron by PCR NEGATIVE NEGATIVE Final   Influenza B by PCR NEGATIVE NEGATIVE Final    Comment: (NOTE) The Xpert Xpress SARS-CoV-2/FLU/RSV plus assay is intended as an aid in the diagnosis of influenza from Nasopharyngeal swab specimens and should not be used as Cameron sole basis for treatment. Nasal washings and aspirates are unacceptable for Xpert Xpress SARS-CoV-2/FLU/RSV testing.  Fact Sheet for Patients: EntrepreneurPulse.com.au  Fact Sheet for Healthcare Providers: IncredibleEmployment.be  This test is not yet approved or cleared by the [place] FDA and has been authorized for detection and/or diagnosis of SARS-CoV-2 by FDA under an Emergency Use Authorization (EUA). This EUA will remain in effect (meaning this test can be used) for the duration of the COVID-19 declaration under Section 564(b)(1) of the Act, 21 U.S.C. section 360bbb-3(b)(1), unless the authorization is terminated or revoked.  Performed at [facility], [address] [place], [place] [address]      Labs: BNP (last 3 results) No results for input(s): BNP in the last 8760  hours. Basic Metabolic Panel: Recent Labs  Lab 11/18/20 1758 11/20/20 0632  NA 136 137  K 3.5 3.0*  CL 96* 96*  CO2 26 28  GLUCOSE 113* 96  BUN 7 <5*  CREATININE 0.72 0.62  CALCIUM 8.8* 8.5*   Liver Function Tests: Recent Labs  Lab 11/18/20 1758 11/20/20 0632  AST 223* 76*  ALT 120* 66*  ALKPHOS 105 80  BILITOT 2.9* 2.6*  PROT 8.5* 7.2  ALBUMIN 4.8 3.7   Recent Labs  Lab 11/18/20 1758 11/20/20 0632  LIPASE 413* 235*   No results for input(s): AMMONIA in the last 168 hours. CBC: Recent Labs  Lab 11/18/20 1758 11/19/20 0841 11/20/20 0632  WBC 8.4 11.4* 7.3  HGB 15.6 14.9 14.5  HCT 42.7  40.8 41.2  MCV 97.0 96.0 95.8  PLT 171 168 167   Cardiac Enzymes: No results for input(s): CKTOTAL, CKMB, CKMBINDEX, TROPONINI in the last 168 hours. BNP: Invalid input(s): POCBNP CBG: No results for input(s): GLUCAP in the last 168 hours. D-Dimer No results for input(s): DDIMER in the last 72 hours. Hgb A1c No results for input(s): HGBA1C in the last 72 hours. Lipid Profile No results for input(s): CHOL, HDL, LDLCALC, TRIG, CHOLHDL, LDLDIRECT in the last 72 hours. Thyroid function studies No results for input(s): TSH, T4TOTAL, T3FREE, THYROIDAB in the last 72 hours.  Invalid input(s): FREET3 Anemia work up No results for input(s): VITAMINB12, FOLATE, FERRITIN, TIBC, IRON, RETICCTPCT in the last 72 hours. Urinalysis    Component Value Date/Time   COLORURINE YELLOW (Cameron) 11/19/2020 0210   APPEARANCEUR CLEAR (Cameron) 11/19/2020 0210   LABSPEC 1.032 (H) 11/19/2020 0210   PHURINE 6.0 11/19/2020 0210   GLUCOSEU NEGATIVE 11/19/2020 0210   HGBUR NEGATIVE 11/19/2020 0210   BILIRUBINUR NEGATIVE 11/19/2020 0210   KETONESUR 80 (Cameron) 11/19/2020 0210   PROTEINUR NEGATIVE 11/19/2020 0210   NITRITE NEGATIVE 11/19/2020 0210   LEUKOCYTESUR NEGATIVE 11/19/2020 0210   Sepsis Labs Invalid input(s): PROCALCITONIN,  WBC,  LACTICIDVEN Microbiology Recent Results (from the past 240 hour(s))  Resp Panel by RT-PCR (Flu Cameron&B, Covid) Nasopharyngeal Swab     Status: None   Collection Time: 11/19/20  5:09 AM   Specimen: Nasopharyngeal Swab; Nasopharyngeal(NP) swabs in vial transport medium  Result Value Ref Range Status   SARS Coronavirus 2 by RT PCR NEGATIVE NEGATIVE Final    Comment: (NOTE) SARS-CoV-2 target nucleic acids are NOT DETECTED.  The SARS-CoV-2 RNA is generally detectable in upper respiratory specimens during the acute phase of infection. The lowest concentration of SARS-CoV-2 viral copies this assay can detect is 138 copies/mL. Cameron negative result does not preclude SARS-Cov-2 infection and  should not be used as the sole basis for treatment or other patient management decisions. Cameron negative result may occur with  improper specimen collection/handling, submission of specimen other than nasopharyngeal swab, presence of viral mutation(s) within the areas targeted by this assay, and inadequate number of viral copies(<138 copies/mL). Cameron negative result must be combined with clinical observations, patient history, and epidemiological information. The expected result is Negative.  Fact Sheet for Patients:  EntrepreneurPulse.com.au  Fact Sheet for Healthcare Providers:  IncredibleEmployment.be  This test is no t yet approved or cleared by the [place] FDA and  has been authorized for detection and/or diagnosis of SARS-CoV-2 by FDA under an Emergency Use Authorization (EUA). This EUA will remain  in effect (meaning this test can be used) for the duration of the COVID-19 declaration under Section 564(b)(1) of the Act, 21  U.S.C.section 360bbb-3(b)(1), unless the authorization is terminated  or revoked sooner.       Influenza Cameron by PCR NEGATIVE NEGATIVE Final   Influenza B by PCR NEGATIVE NEGATIVE Final    Comment: (NOTE) The Xpert Xpress SARS-CoV-2/FLU/RSV plus assay is intended as an aid in the diagnosis of influenza from Nasopharyngeal swab specimens and should not be used as Cameron sole basis for treatment. Nasal washings and aspirates are unacceptable for Xpert Xpress SARS-CoV-2/FLU/RSV testing.  Fact Sheet for Patients: EntrepreneurPulse.com.au  Fact Sheet for Healthcare Providers: IncredibleEmployment.be  This test is not yet approved or cleared by the [place] FDA and has been authorized for detection and/or diagnosis of SARS-CoV-2 by FDA under an Emergency Use Authorization (EUA). This EUA will remain in effect (meaning this test can be used) for the duration of the COVID-19 declaration  under Section 564(b)(1) of the Act, 21 U.S.C. section 360bbb-3(b)(1), unless the authorization is terminated or revoked.  Performed at [facility], [address] [place], [place] [address]      Time coordinating discharge: Over 30 minutes  SIGNED:   Nolberto Hanlon, MD  [facility] 11/20/2020, 12:14 PM Pager   If 7PM-7AM, please contact night-coverage [email or web address] Password [email or web address]

## 2021-04-30 ENCOUNTER — Other Ambulatory Visit: Payer: Self-pay

## 2021-04-30 ENCOUNTER — Emergency Department
Admission: EM | Admit: 2021-04-30 | Discharge: 2021-04-30 | Disposition: A | Discharge status: Home / Self Care | Payer: Self-pay | Attending: Emergency Medicine | Admitting: Emergency Medicine

## 2021-04-30 DIAGNOSIS — K2900 Acute gastritis without bleeding: Secondary | ICD-10-CM | POA: Insufficient documentation

## 2021-04-30 DIAGNOSIS — R1012 Left upper quadrant pain: Secondary | ICD-10-CM

## 2021-04-30 LAB — CBC
HCT: 46.7 % (ref 39.0–52.0)
Hemoglobin: 16.1 g/dL (ref 13.0–17.0)
MCH: 33.3 pg (ref 26.0–34.0)
MCHC: 34.5 g/dL (ref 30.0–36.0)
MCV: 96.7 fL (ref 80.0–100.0)
Platelets: 251 10*3/uL (ref 150–400)
RBC: 4.83 MIL/uL (ref 4.22–5.81)
RDW: 11.9 % (ref 11.5–15.5)
WBC: 8.2 10*3/uL (ref 4.0–10.5)
nRBC: 0 % (ref 0.0–0.2)

## 2021-04-30 LAB — LIPASE, BLOOD: Lipase: 95 U/L — ABNORMAL HIGH (ref 11–51)

## 2021-04-30 LAB — COMPREHENSIVE METABOLIC PANEL
ALT: 62 U/L — ABNORMAL HIGH (ref 0–44)
AST: 83 U/L — ABNORMAL HIGH (ref 15–41)
Albumin: 4.4 g/dL (ref 3.5–5.0)
Alkaline Phosphatase: 103 U/L (ref 38–126)
Anion gap: 12 (ref 5–15)
BUN: 8 mg/dL (ref 6–20)
CO2: 27 mmol/L (ref 22–32)
Calcium: 9 mg/dL (ref 8.9–10.3)
Chloride: 97 mmol/L — ABNORMAL LOW (ref 98–111)
Creatinine, Ser: 0.64 mg/dL (ref 0.61–1.24)
GFR, Estimated: >60 mL/min (ref >60)
Glucose, Bld: 110 mg/dL — ABNORMAL HIGH (ref 70–99)
Potassium: 3.7 mmol/L (ref 3.5–5.1)
Sodium: 136 mmol/L (ref 135–145)
Total Bilirubin: 2.1 mg/dL — ABNORMAL HIGH (ref 0.3–1.2)
Total Protein: 9 g/dL — ABNORMAL HIGH (ref 6.5–8.1)

## 2021-04-30 MED ORDER — PANTOPRAZOLE SODIUM 40 MG IV SOLR
40.0000 mg | Freq: Once | INTRAVENOUS | Status: AC
  Administered 2021-04-30: 40 mg via INTRAVENOUS
  Filled 2021-04-30: qty 10

## 2021-04-30 MED ORDER — ONDANSETRON HCL 4 MG/2ML IJ SOLN
4.0000 mg | Freq: Once | INTRAMUSCULAR | Status: AC
  Administered 2021-04-30: 4 mg via INTRAVENOUS
  Filled 2021-04-30: qty 2

## 2021-04-30 MED ORDER — FAMOTIDINE 20 MG PO TABS: 20.0000 mg | ORAL_TABLET | Freq: Two times a day (BID) | ORAL | 0 refills | Status: DC

## 2021-04-30 MED ORDER — KETOROLAC TROMETHAMINE 30 MG/ML IJ SOLN
15.0000 mg | INTRAMUSCULAR | Status: AC
  Administered 2021-04-30: 15 mg via INTRAVENOUS
  Filled 2021-04-30: qty 1

## 2021-04-30 MED ORDER — SODIUM CHLORIDE 0.9 % IV BOLUS
1000.0000 mL | Freq: Once | INTRAVENOUS | Status: AC
  Administered 2021-04-30: 1000 mL via INTRAVENOUS

## 2021-04-30 MED ORDER — ONDANSETRON 4 MG PO TBDP: 4.0000 mg | ORAL_TABLET | Freq: Three times a day (TID) | ORAL | 0 refills | Status: DC | PRN

## 2021-04-30 NOTE — ED Provider Notes (Signed)
? ?[facility] ?Provider Note ? ? ? Event Date/Time  ? First MD Initiated Contact with Patient 04/30/21 [phone]   ?  (approximate) ? ? ?History  ? ?Abdominal Pain ? ? ?HPI ? ?Cameron Santiago is a 26 y.o. male with a past history of alcoholic pancreatitis who comes ED complaining of nausea and vomiting for the past 3 days associated left upper abdominal pain.  Nonradiating, no aggravating or alleviating factors.  Has been eating okay, drinking fluids.  Normal bowel movement yesterday.  No fever.  No chest pain shortness of breath dizziness or syncope. ?  ? ? ?Physical Exam  ? ?Triage Vital Signs: ?ED Triage Vitals  ?Enc Vitals Group  ?   BP 04/30/21 1044 (!) 157/84  ?   Pulse Rate 04/30/21 1044 89  ?   Resp 04/30/21 1044 (!) 24  ?   Temp 04/30/21 1044 98.4 ?F (36.9 ?C)  ?   Temp Source 04/30/21 1044 Oral  ?   SpO2 04/30/21 1044 98 %  ?   Weight --   ?   Height 04/30/21 1044 5\' 11"  (1.803 m)  ?   Head Circumference --   ?   Peak Flow --   ?   Pain Score 04/30/21 0938 8  ?   Pain Loc --   ?   Pain Edu? --   ?   Excl. in GC? --   ? ? ?Most recent vital signs: ?Vitals:  ? 04/30/21 1126 04/30/21 1127  ?BP: (!) 172/119   ?Pulse:  65  ?Resp:    ?Temp:    ?SpO2:  97%  ? ? ? ?General: Awake, no distress.  ?CV:  Good peripheral perfusion.  Regular rate and rhythm ?Resp:  Normal effort.  Clear to auscultation bilaterally ?Abd:  No distention.  Soft with mild left lower quadrant tenderness. ?Other:  No rash, no lower extremity edema.  Moist oral mucosa ? ? ?ED Results / Procedures / Treatments  ? ?Labs ?(all labs ordered are listed, but only abnormal results are displayed) ?Labs Reviewed  ?LIPASE, BLOOD - Abnormal; Notable for the following components:  ?    Result Value  ? Lipase 95 (*)   ? All other components within normal limits  ?COMPREHENSIVE METABOLIC PANEL - Abnormal; Notable for the following components:  ? Chloride 97 (*)   ? Glucose, Bld 110 (*)   ? Total Protein 9.0 (*)   ? AST 83 (*)   ? ALT 62  (*)   ? Total Bilirubin 2.1 (*)   ? All other components within normal limits  ?CBC  ?URINALYSIS, ROUTINE W REFLEX MICROSCOPIC  ? ? ? ?EKG ? ? ? ? ?RADIOLOGY ? ? ? ? ?PROCEDURES: ? ?Critical Care performed: No ? ?Procedures ? ? ?MEDICATIONS ORDERED IN ED: ?Medications  ?sodium chloride 0.9 % bolus 1,000 mL (1,000 mLs Intravenous New Bag/Given 04/30/21 1057)  ?ketorolac (TORADOL) 30 MG/ML injection 15 mg (15 mg Intravenous Given 04/30/21 1058)  ?pantoprazole (PROTONIX) injection 40 mg (40 mg Intravenous Given 04/30/21 1057)  ?ondansetron (ZOFRAN) injection 4 mg (4 mg Intravenous Given 04/30/21 1058)  ? ? ? ?IMPRESSION / MDM / ASSESSMENT AND PLAN / ED COURSE  ?I reviewed the triage vital signs and the nursing notes. ?             ?               ? ?Differential diagnosis includes, but is not limited to, pancreatitis, biliary  disease, gastritis, viral syndrome, gastritis ? ?Patient presents with mild abdominal pain, exam consistent with gastritis.  He is nontoxic with reassuring vital signs.  Labs are unremarkable.  Feeling better after antacids, anti-inflammatories, antiemetics, hydration.  Not requiring admission due to his improvement in symptoms and reassuring labs. ? ? Considering the patient's symptoms, medical history, and physical examination today, I have low suspicion for cholecystitis or biliary pathology, pancreatitis, perforation or bowel obstruction, hernia, intra-abdominal abscess, AAA or dissection, volvulus or intussusception, mesenteric ischemia, or appendicitis. ? ? ? ?  ? ? ?FINAL CLINICAL IMPRESSION(S) / ED DIAGNOSES  ? ?Final diagnoses:  ?Left upper quadrant abdominal pain  ?Acute gastritis without hemorrhage, unspecified gastritis type  ? ? ? ?Rx / DC Orders  ? ?ED Discharge Orders   ? ?      Ordered  ?  ondansetron (ZOFRAN-ODT) 4 MG disintegrating tablet  Every 8 hours PRN       ? 04/30/21 1146  ?  famotidine (PEPCID) 20 MG tablet  2 times daily       ? 04/30/21 1146  ? ?  ?  ? ?  ? ? ? ?Note:  This  document was prepared using Dragon voice recognition software and may include unintentional dictation errors. ?  ?Sharman Cheek, MD ?04/30/21 1151 ? ?

## 2021-04-30 NOTE — ED Notes (Signed)
Pt gave verbal consent to DC ° °

## 2021-04-30 NOTE — ED Notes (Signed)
Pt drank of water with no nausea or vomiting  ? ?

## 2021-04-30 NOTE — ED Triage Notes (Signed)
Pt c/o left sided abd pain with N/V for the past 3 days, denies diarrhea, last BM yesterday pt is ambulatory with a steady gait on arrival. ?

## 2021-05-26 ENCOUNTER — Emergency Department
Admission: EM | Admit: 2021-05-26 | Discharge: 2021-05-26 | Disposition: A | Discharge status: Home / Self Care | Payer: Self-pay | Attending: Emergency Medicine | Admitting: Emergency Medicine

## 2021-05-26 ENCOUNTER — Emergency Department: Payer: Self-pay

## 2021-05-26 ENCOUNTER — Other Ambulatory Visit: Payer: Self-pay

## 2021-05-26 ENCOUNTER — Encounter: Payer: Self-pay | Admitting: Emergency Medicine

## 2021-05-26 DIAGNOSIS — R7401 Elevation of levels of liver transaminase levels: Secondary | ICD-10-CM | POA: Insufficient documentation

## 2021-05-26 DIAGNOSIS — K852 Alcohol induced acute pancreatitis without necrosis or infection: Secondary | ICD-10-CM | POA: Insufficient documentation

## 2021-05-26 DIAGNOSIS — D72829 Elevated white blood cell count, unspecified: Secondary | ICD-10-CM | POA: Insufficient documentation

## 2021-05-26 LAB — CBC
HCT: 47.8 % (ref 39.0–52.0)
Hemoglobin: 16.8 g/dL (ref 13.0–17.0)
MCH: 33.3 pg (ref 26.0–34.0)
MCHC: 35.1 g/dL (ref 30.0–36.0)
MCV: 94.7 fL (ref 80.0–100.0)
Platelets: 239 10*3/uL (ref 150–400)
RBC: 5.05 MIL/uL (ref 4.22–5.81)
RDW: 11.9 % (ref 11.5–15.5)
WBC: 14.1 10*3/uL — ABNORMAL HIGH (ref 4.0–10.5)
nRBC: 0 % (ref 0.0–0.2)

## 2021-05-26 LAB — COMPREHENSIVE METABOLIC PANEL
ALT: 48 U/L — ABNORMAL HIGH (ref 0–44)
AST: 53 U/L — ABNORMAL HIGH (ref 15–41)
Albumin: 4.6 g/dL (ref 3.5–5.0)
Alkaline Phosphatase: 80 U/L (ref 38–126)
Anion gap: 12 (ref 5–15)
BUN: 11 mg/dL (ref 6–20)
CO2: 27 mmol/L (ref 22–32)
Calcium: 8.5 mg/dL — ABNORMAL LOW (ref 8.9–10.3)
Chloride: 97 mmol/L — ABNORMAL LOW (ref 98–111)
Creatinine, Ser: 0.61 mg/dL (ref 0.61–1.24)
GFR, Estimated: >60 mL/min (ref >60)
Glucose, Bld: 132 mg/dL — ABNORMAL HIGH (ref 70–99)
Potassium: 3.1 mmol/L — ABNORMAL LOW (ref 3.5–5.1)
Sodium: 136 mmol/L (ref 135–145)
Total Bilirubin: 1.7 mg/dL — ABNORMAL HIGH (ref 0.3–1.2)
Total Protein: 9.2 g/dL — ABNORMAL HIGH (ref 6.5–8.1)

## 2021-05-26 LAB — URINALYSIS, ROUTINE W REFLEX MICROSCOPIC
Bacteria, UA: NONE SEEN
Bilirubin Urine: NEGATIVE
Glucose, UA: NEGATIVE mg/dL
Hgb urine dipstick: NEGATIVE
Ketones, ur: 5 mg/dL — AB
Leukocytes,Ua: NEGATIVE
Nitrite: NEGATIVE
Protein, ur: 100 mg/dL — AB
Specific Gravity, Urine: 1.024 (ref 1.005–1.030)
pH: 5 (ref 5.0–8.0)

## 2021-05-26 LAB — LIPASE, BLOOD: Lipase: 84 U/L — ABNORMAL HIGH (ref 11–51)

## 2021-05-26 MED ORDER — FAMOTIDINE IN NACL 20-0.9 MG/50ML-% IV SOLN
20.0000 mg | Freq: Once | INTRAVENOUS | Status: AC
  Administered 2021-05-26: 20 mg via INTRAVENOUS
  Filled 2021-05-26: qty 50

## 2021-05-26 MED ORDER — ONDANSETRON HCL 4 MG/2ML IJ SOLN
4.0000 mg | Freq: Once | INTRAMUSCULAR | Status: AC
Start: 2021-05-26 — End: 2021-05-26
  Administered 2021-05-26: 4 mg via INTRAVENOUS
  Filled 2021-05-26: qty 2

## 2021-05-26 MED ORDER — METOCLOPRAMIDE HCL 5 MG/ML IJ SOLN
10.0000 mg | Freq: Once | INTRAMUSCULAR | Status: AC
  Administered 2021-05-26: 10 mg via INTRAVENOUS
  Filled 2021-05-26: qty 2

## 2021-05-26 MED ORDER — ONDANSETRON HCL 4 MG PO TABS: 4.0000 mg | ORAL_TABLET | Freq: Every day | ORAL | 0 refills | Status: AC | PRN

## 2021-05-26 MED ORDER — MORPHINE SULFATE (PF) 4 MG/ML IV SOLN
4.0000 mg | Freq: Once | INTRAVENOUS | Status: AC
Start: 2021-05-26 — End: 2021-05-26
  Administered 2021-05-26: 4 mg via INTRAVENOUS
  Filled 2021-05-26: qty 1

## 2021-05-26 MED ORDER — OXYCODONE HCL 5 MG PO TABS
5.0000 mg | ORAL_TABLET | Freq: Three times a day (TID) | ORAL | 0 refills | Status: AC | PRN
Start: 2021-05-26 — End: 2021-05-31

## 2021-05-26 MED ORDER — IOHEXOL 300 MG/ML  SOLN
100.0000 mL | Freq: Once | INTRAMUSCULAR | Status: AC | PRN
  Administered 2021-05-26: 100 mL via INTRAVENOUS

## 2021-05-26 MED ORDER — SODIUM CHLORIDE 0.9 % IV BOLUS
1000.0000 mL | Freq: Once | INTRAVENOUS | Status: AC
  Administered 2021-05-26: 1000 mL via INTRAVENOUS

## 2021-05-26 MED ORDER — HYDROMORPHONE HCL 1 MG/ML IJ SOLN
1.0000 mg | Freq: Once | INTRAMUSCULAR | Status: AC
Start: 2021-05-26 — End: 2021-05-26
  Administered 2021-05-26: 1 mg via INTRAVENOUS
  Filled 2021-05-26: qty 1

## 2021-05-26 NOTE — ED Provider Notes (Signed)
? ?[facility] ?Provider Note ? ? ? Event Date/Time  ? First MD Initiated Contact with Patient 05/26/21 [phone]   ?  (approximate) ? ? ?History  ? ?Abdominal Pain ? ? ?HPI ? ?DENVIL CANNING is a 26 y.o. male with past medical history of alcoholic pancreatitis and daily alcohol use presents with abdominal pain.  Symptoms started yesterday.  Pain is primarily left upper quadrant epigastric region but also diffuse.  Is constant since onset.  Has had multiple episodes of emesis not tolerating p.o.  Denies hematemesis.  No diarrhea no fevers chills.  Has had pancreatitis in the past as well as gastritis.  She was drinking heavily this weekend. ?  ? ?No past medical history on file. ? ?Patient Active Problem List  ? Diagnosis Date Noted  ? Alcoholic pancreatitis 11/19/2020  ? Acute appendicitis 02/14/2015  ? ? ? ?Physical Exam  ?Triage Vital Signs: ?ED Triage Vitals  ?Enc Vitals Group  ?   BP 05/26/21 0923 (!) 172/130  ?   Pulse Rate 05/26/21 0921 93  ?   Resp 05/26/21 0921 18  ?   Temp 05/26/21 0921 98.2 ?F (36.8 ?C)  ?   Temp Source 05/26/21 0921 Oral  ?   SpO2 05/26/21 0921 95 %  ?   Weight 05/26/21 0922 230 lb (104.3 kg)  ?   Height 05/26/21 0922 5\' 11"  (1.803 m)  ?   Head Circumference --   ?   Peak Flow --   ?   Pain Score 05/26/21 0921 8  ?   Pain Loc --   ?   Pain Edu? --   ?   Excl. in GC? --   ? ? ?Most recent vital signs: ?Vitals:  ? 05/26/21 1400 05/26/21 1438  ?BP:  (!) 168/99  ?Pulse: 77 80  ?Resp: (!) 25 (!) 22  ?Temp:  98.2 ?F (36.8 ?C)  ?SpO2: 97% 97%  ? ? ? ?General: Awake, is uncomfortable ?CV:  Good peripheral perfusion.  ?Resp:  Normal effort.  ?Abd:  No distention.  Tender throughout, worse in the epigastric region left upper quadrant ?Neuro:             Awake, Alert, Oriented x 3  ?Other:   ? ? ?ED Results / Procedures / Treatments  ?Labs ?(all labs ordered are listed, but only abnormal results are displayed) ?Labs Reviewed  ?LIPASE, BLOOD - Abnormal; Notable for the following  components:  ?    Result Value  ? Lipase 84 (*)   ? All other components within normal limits  ?COMPREHENSIVE METABOLIC PANEL - Abnormal; Notable for the following components:  ? Potassium 3.1 (*)   ? Chloride 97 (*)   ? Glucose, Bld 132 (*)   ? Calcium 8.5 (*)   ? Total Protein 9.2 (*)   ? AST 53 (*)   ? ALT 48 (*)   ? Total Bilirubin 1.7 (*)   ? All other components within normal limits  ?CBC - Abnormal; Notable for the following components:  ? WBC 14.1 (*)   ? All other components within normal limits  ?URINALYSIS, ROUTINE W REFLEX MICROSCOPIC - Abnormal; Notable for the following components:  ? Color, Urine AMBER (*)   ? APPearance CLOUDY (*)   ? Ketones, ur 5 (*)   ? Protein, ur 100 (*)   ? All other components within normal limits  ? ? ? ?EKG ? ?EKG interpreted by myself, sinus tachycardia normal axis T wave  inversions in inferior leads ? ? ?RADIOLOGY ?Reviewed patient CT abdomen pelvis which shows localized pancreatitis ? ? ?PROCEDURES: ? ?Critical Care performed: No ? ?.1-3 Lead EKG Interpretation ?Performed by: Georga Hacking, MD ?Authorized by: Georga Hacking, MD  ? ?  Interpretation: abnormal   ?  ECG rate assessment: tachycardic   ?  Rhythm: sinus tachycardia   ?  Ectopy: none   ?  Conduction: normal   ? ?The patient is on the cardiac monitor to evaluate for evidence of arrhythmia and/or significant heart rate changes. ? ? ?MEDICATIONS ORDERED IN ED: ?Medications  ?sodium chloride 0.9 % bolus 1,000 mL (0 mLs Intravenous Stopped 05/26/21 1437)  ?morphine (PF) 4 MG/ML injection 4 mg (4 mg Intravenous Given 05/26/21 1102)  ?famotidine (PEPCID) IVPB 20 mg premix (0 mg Intravenous Stopped 05/26/21 1257)  ?ondansetron (ZOFRAN) injection 4 mg (4 mg Intravenous Given 05/26/21 1102)  ?iohexol (OMNIPAQUE) 300 MG/ML solution 100 mL (100 mLs Intravenous Contrast Given 05/26/21 1130)  ?HYDROmorphone (DILAUDID) injection 1 mg (1 mg Intravenous Given 05/26/21 1258)  ?metoCLOPramide (REGLAN) injection 10 mg (10 mg  Intravenous Given 05/26/21 1258)  ? ? ? ?IMPRESSION / MDM / ASSESSMENT AND PLAN / ED COURSE  ?I reviewed the triage vital signs and the nursing notes. ?             ?               ? ?Differential diagnosis includes, but is not limited to, gastritis, alcoholic hepatitis, pancreatitis, biliary pathology, perforated ulcer ? ?Is a 26 year old male who drinks alcohol daily presenting with abdominal pain in setting of drinking heavily this weekend.  Has had multiple episodes of emesis inability to tolerate p.o.  No other infectious symptoms.  Appears uncomfortable on exam his abdomen is rather tender throughout primarily in the epigastric region with some voluntary guarding.  Labs are notable for very mild transaminitis and lipase is 81 but this is not more than 3 times upper limit of normal so does not qualify as pancreatitis.  Also has a leukocytosis of 14.  Given his degree of discomfort will give fluids morphine Pepcid Zofran and obtain a CT abdomen pelvis with contrast.  Will reassess. ? ?CT abdomen pelvis is consistent with acute pancreatitis localized to the pancreatic tail.  On reassessment patient still complaining of significant pain.  Nausea is improved and he is asking to drink water.  We will give a dose of Dilaudid and Reglan and reassess. ? ?After another dose of Dilaudid and Reglan patient tolerating p.o. and thinks he will be able to manage his pain at home.  We did discuss admission for pain control and fluids versus discharge and ultimately he opted for discharge.  He was given a prescription for Zofran and oxycodone.  We discussed return precautions for worsening pain, pain that is poorly controlled or inability to tolerate p.o.  Discussed clear liquid diet with slow advancement based on symptoms as well as alcohol cessation. ? ? ?Clinical Course as of 05/26/21 1609  ?Mon May 26, 2021  ?1151 IMPRESSION: ?Acute pancreatitis affecting the distal body and tail. Regional ?edema. No evidence of abscess or  necrosis. ?  ?Advanced steatosis of the liver. ? ? [KM]  ?  ?Clinical Course User Index ?[KM] Georga Hacking, MD  ? ? ? ?FINAL CLINICAL IMPRESSION(S) / ED DIAGNOSES  ? ?Final diagnoses:  ?Alcohol-induced acute pancreatitis without infection or necrosis  ? ? ? ?Rx / DC Orders  ? ?ED  Discharge Orders   ? ?      Ordered  ?  ondansetron (ZOFRAN) 4 MG tablet  Daily PRN       ? 05/26/21 1414  ?  oxyCODONE (ROXICODONE) 5 MG immediate release tablet  Every 8 hours PRN       ? 05/26/21 1414  ? ?  ?  ? ?  ? ? ? ?Note:  This document was prepared using Dragon voice recognition software and may include unintentional dictation errors. ?  ?Georga HackingMcHugh, Khyri Hinzman Rose, MD ?05/26/21 1609 ? ?

## 2021-05-26 NOTE — ED Triage Notes (Signed)
Pt c/o LUQ to mid abd pain with N/V, "Ive been drinking too much". Pt thinks he  might have pancreatitis with no hx ?

## 2021-05-26 NOTE — Discharge Instructions (Signed)
You can take the Zofran for nausea.  Please take ibuprofen 400 mg every 6 hours for pain and you can take the oxycodone for breakthrough pain.  Please drink only clear liquids until you are starting to feel improved.  Then you can add nonfatty solids.  If your pain is not controlled you are unable to eat or drink or you develop fever or acutely worsening pain, please return to the emergency department. ?

## 2021-07-10 ENCOUNTER — Observation Stay: Payer: Self-pay

## 2021-07-10 ENCOUNTER — Encounter: Payer: Self-pay | Admitting: Emergency Medicine

## 2021-07-10 ENCOUNTER — Other Ambulatory Visit: Payer: Self-pay

## 2021-07-10 ENCOUNTER — Inpatient Hospital Stay
Admission: EM | Admit: 2021-07-10 | Discharge: 2021-07-16 | DRG: 439 | Disposition: A | Discharge status: Home / Self Care | Payer: Self-pay | Attending: Internal Medicine | Admitting: Internal Medicine

## 2021-07-10 DIAGNOSIS — K859 Acute pancreatitis without necrosis or infection, unspecified: Secondary | ICD-10-CM | POA: Diagnosis present

## 2021-07-10 DIAGNOSIS — R030 Elevated blood-pressure reading, without diagnosis of hypertension: Secondary | ICD-10-CM | POA: Diagnosis present

## 2021-07-10 DIAGNOSIS — K5900 Constipation, unspecified: Secondary | ICD-10-CM | POA: Clinically undetermined

## 2021-07-10 DIAGNOSIS — D72829 Elevated white blood cell count, unspecified: Secondary | ICD-10-CM | POA: Diagnosis present

## 2021-07-10 DIAGNOSIS — R7401 Elevation of levels of liver transaminase levels: Secondary | ICD-10-CM | POA: Diagnosis present

## 2021-07-10 DIAGNOSIS — E876 Hypokalemia: Secondary | ICD-10-CM | POA: Diagnosis not present

## 2021-07-10 DIAGNOSIS — K219 Gastro-esophageal reflux disease without esophagitis: Secondary | ICD-10-CM | POA: Diagnosis present

## 2021-07-10 DIAGNOSIS — F10239 Alcohol dependence with withdrawal, unspecified: Secondary | ICD-10-CM | POA: Diagnosis present

## 2021-07-10 DIAGNOSIS — R7989 Other specified abnormal findings of blood chemistry: Secondary | ICD-10-CM | POA: Diagnosis present

## 2021-07-10 DIAGNOSIS — K852 Alcohol induced acute pancreatitis without necrosis or infection: Principal | ICD-10-CM | POA: Diagnosis present

## 2021-07-10 DIAGNOSIS — Z79899 Other long term (current) drug therapy: Secondary | ICD-10-CM

## 2021-07-10 DIAGNOSIS — F1010 Alcohol abuse, uncomplicated: Secondary | ICD-10-CM | POA: Diagnosis present

## 2021-07-10 HISTORY — DX: Gastro-esophageal reflux disease without esophagitis: K21.9

## 2021-07-10 HISTORY — DX: Alcohol induced acute pancreatitis without necrosis or infection: K85.20

## 2021-07-10 HISTORY — DX: Alcohol abuse, uncomplicated: F10.10

## 2021-07-10 LAB — CBC
HCT: 47.6 % (ref 39.0–52.0)
Hemoglobin: 16.3 g/dL (ref 13.0–17.0)
MCH: 33.1 pg (ref 26.0–34.0)
MCHC: 34.2 g/dL (ref 30.0–36.0)
MCV: 96.6 fL (ref 80.0–100.0)
Platelets: 291 10*3/uL (ref 150–400)
RBC: 4.93 MIL/uL (ref 4.22–5.81)
RDW: 12.9 % (ref 11.5–15.5)
WBC: 11.1 10*3/uL — ABNORMAL HIGH (ref 4.0–10.5)
nRBC: 0 % (ref 0.0–0.2)

## 2021-07-10 LAB — COMPREHENSIVE METABOLIC PANEL
ALT: 84 U/L — ABNORMAL HIGH (ref 0–44)
AST: 146 U/L — ABNORMAL HIGH (ref 15–41)
Albumin: 4.6 g/dL (ref 3.5–5.0)
Alkaline Phosphatase: 120 U/L (ref 38–126)
Anion gap: 14 (ref 5–15)
BUN: 7 mg/dL (ref 6–20)
CO2: 25 mmol/L (ref 22–32)
Calcium: 9.4 mg/dL (ref 8.9–10.3)
Chloride: 99 mmol/L (ref 98–111)
Creatinine, Ser: 0.69 mg/dL (ref 0.61–1.24)
GFR, Estimated: >60 mL/min (ref >60)
Glucose, Bld: 136 mg/dL — ABNORMAL HIGH (ref 70–99)
Potassium: 3.8 mmol/L (ref 3.5–5.1)
Sodium: 138 mmol/L (ref 135–145)
Total Bilirubin: 2.4 mg/dL — ABNORMAL HIGH (ref 0.3–1.2)
Total Protein: 9.5 g/dL — ABNORMAL HIGH (ref 6.5–8.1)

## 2021-07-10 LAB — URINALYSIS, ROUTINE W REFLEX MICROSCOPIC
Bilirubin Urine: NEGATIVE
Glucose, UA: NEGATIVE mg/dL
Hgb urine dipstick: NEGATIVE
Ketones, ur: 20 mg/dL — AB
Leukocytes,Ua: NEGATIVE
Nitrite: NEGATIVE
Protein, ur: 100 mg/dL — AB
Specific Gravity, Urine: >1.046 — ABNORMAL HIGH (ref 1.005–1.030)
pH: 6 (ref 5.0–8.0)

## 2021-07-10 LAB — LIPASE, BLOOD: Lipase: 352 U/L — ABNORMAL HIGH (ref 11–51)

## 2021-07-10 LAB — TRIGLYCERIDES: Triglycerides: 266 mg/dL — ABNORMAL HIGH (ref <150)

## 2021-07-10 MED ORDER — LORAZEPAM 2 MG/ML IJ SOLN: 0.0000 mg | Freq: Two times a day (BID) | INTRAMUSCULAR | Status: DC

## 2021-07-10 MED ORDER — LORAZEPAM 1 MG PO TABS: 1.0000 mg | ORAL_TABLET | ORAL | Status: AC | PRN

## 2021-07-10 MED ORDER — MORPHINE SULFATE (PF) 4 MG/ML IV SOLN
4.0000 mg | Freq: Once | INTRAVENOUS | Status: AC
  Administered 2021-07-10: 4 mg via INTRAVENOUS
  Filled 2021-07-10: qty 1

## 2021-07-10 MED ORDER — THIAMINE HCL 100 MG PO TABS
100.0000 mg | ORAL_TABLET | Freq: Every day | ORAL | Status: DC
  Administered 2021-07-10 – 2021-07-16 (×7): 100 mg via ORAL
  Filled 2021-07-10 (×7): qty 1

## 2021-07-10 MED ORDER — ADULT MULTIVITAMIN W/MINERALS CH
1.0000 | ORAL_TABLET | Freq: Every day | ORAL | Status: DC
  Administered 2021-07-10 – 2021-07-16 (×7): 1 via ORAL
  Filled 2021-07-10 (×7): qty 1

## 2021-07-10 MED ORDER — ONDANSETRON HCL 4 MG/2ML IJ SOLN
4.0000 mg | Freq: Once | INTRAMUSCULAR | Status: AC
  Administered 2021-07-10: 4 mg via INTRAVENOUS
  Filled 2021-07-10: qty 2

## 2021-07-10 MED ORDER — HYDROMORPHONE HCL 1 MG/ML IJ SOLN
1.0000 mg | INTRAMUSCULAR | Status: DC | PRN
  Administered 2021-07-10 – 2021-07-15 (×26): 1 mg via INTRAVENOUS
  Filled 2021-07-10 (×26): qty 1

## 2021-07-10 MED ORDER — ONDANSETRON HCL 4 MG/2ML IJ SOLN
4.0000 mg | Freq: Three times a day (TID) | INTRAMUSCULAR | Status: DC | PRN
  Administered 2021-07-10: 4 mg via INTRAVENOUS
  Filled 2021-07-10: qty 2

## 2021-07-10 MED ORDER — LORAZEPAM 2 MG/ML IJ SOLN
0.0000 mg | Freq: Four times a day (QID) | INTRAMUSCULAR | Status: DC
  Administered 2021-07-10: 2 mg via INTRAVENOUS
  Administered 2021-07-11: 1 mg via INTRAVENOUS
  Filled 2021-07-10: qty 1

## 2021-07-10 MED ORDER — PROMETHAZINE HCL 25 MG/ML IJ SOLN
12.5000 mg | Freq: Three times a day (TID) | INTRAMUSCULAR | Status: DC | PRN
  Administered 2021-07-10: 12.5 mg via INTRAVENOUS
  Filled 2021-07-10 (×2): qty 0.5

## 2021-07-10 MED ORDER — IOHEXOL 300 MG/ML  SOLN
100.0000 mL | Freq: Once | INTRAMUSCULAR | Status: AC | PRN
  Administered 2021-07-10: 100 mL via INTRAVENOUS

## 2021-07-10 MED ORDER — THIAMINE HCL 100 MG/ML IJ SOLN
100.0000 mg | Freq: Every day | INTRAMUSCULAR | Status: DC
  Filled 2021-07-10 (×2): qty 2

## 2021-07-10 MED ORDER — IBUPROFEN 400 MG PO TABS
400.0000 mg | ORAL_TABLET | Freq: Four times a day (QID) | ORAL | Status: DC | PRN
Start: 2021-07-10 — End: 2021-07-16
  Administered 2021-07-11 – 2021-07-13 (×5): 400 mg via ORAL
  Filled 2021-07-10 (×5): qty 1

## 2021-07-10 MED ORDER — SODIUM CHLORIDE 0.9 % IV SOLN
1000.0000 mL | Freq: Once | INTRAVENOUS | Status: AC
  Administered 2021-07-10: 1000 mL via INTRAVENOUS

## 2021-07-10 MED ORDER — ENOXAPARIN SODIUM 60 MG/0.6ML IJ SOSY
0.5000 mg/kg | PREFILLED_SYRINGE | INTRAMUSCULAR | Status: DC
  Administered 2021-07-10 – 2021-07-15 (×6): 52.5 mg via SUBCUTANEOUS
  Filled 2021-07-10 (×6): qty 0.6

## 2021-07-10 MED ORDER — SODIUM CHLORIDE 0.9 % IV SOLN: INTRAVENOUS | Status: DC

## 2021-07-10 MED ORDER — OXYCODONE HCL 5 MG PO TABS
5.0000 mg | ORAL_TABLET | Freq: Four times a day (QID) | ORAL | Status: DC | PRN
  Administered 2021-07-10 – 2021-07-14 (×8): 5 mg via ORAL
  Filled 2021-07-10 (×8): qty 1

## 2021-07-10 MED ORDER — FAMOTIDINE 20 MG PO TABS
20.0000 mg | ORAL_TABLET | Freq: Two times a day (BID) | ORAL | Status: DC
Start: 2021-07-10 — End: 2021-07-16
  Administered 2021-07-10 – 2021-07-16 (×13): 20 mg via ORAL
  Filled 2021-07-10 (×13): qty 1

## 2021-07-10 MED ORDER — LORAZEPAM 2 MG/ML IJ SOLN
1.0000 mg | INTRAMUSCULAR | Status: AC | PRN
  Administered 2021-07-10: 2 mg via INTRAVENOUS
  Administered 2021-07-11: 1 mg via INTRAVENOUS
  Administered 2021-07-13: 2 mg via INTRAVENOUS
  Filled 2021-07-10 (×3): qty 1

## 2021-07-10 MED ORDER — FOLIC ACID 1 MG PO TABS
1.0000 mg | ORAL_TABLET | Freq: Every day | ORAL | Status: DC
  Administered 2021-07-10 – 2021-07-16 (×7): 1 mg via ORAL
  Filled 2021-07-10 (×7): qty 1

## 2021-07-10 MED ORDER — HYDRALAZINE HCL 20 MG/ML IJ SOLN
5.0000 mg | INTRAMUSCULAR | Status: DC | PRN
  Administered 2021-07-10 – 2021-07-11 (×3): 5 mg via INTRAVENOUS
  Filled 2021-07-10 (×3): qty 1

## 2021-07-10 NOTE — Assessment & Plan Note (Addendum)
Presented with abdominal pain, nausea and vomiting.  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  Lipase 352.  Triglycerides 266.  CT abdomen pelvis showed marked edema adjacent to body and tail of pancreas consistent with acute pancreatitis, no evidence of focal pancreatic necrosis, no loculated fluid collections in or around the pancreas.  5/21: Tolerating clear liquids fairly well, ongoing epigastric pain no nausea vomiting 5/22: Tolerating full liquids but still has significantly increased epigastric pain after any p.o. intake including water.  No further nausea vomiting.  -- Continue full liquid diet, hold off further advancement for today --Advance diet tomorrow with more improved --Repeat lipase level --Continue IV fluids, antiemetics, analgesics --Monitor abdominal exam closely --If worsening abdominal exam or pain unrelated to p.o. intake, repeat CT abdomen

## 2021-07-10 NOTE — Progress Notes (Signed)
PHARMACIST - PHYSICIAN COMMUNICATION  CONCERNING:  Enoxaparin (Lovenox) for DVT Prophylaxis    RECOMMENDATION: Patient was prescribed enoxaprin 40mg  q24 hours for VTE prophylaxis.   Filed Weights   07/10/21 0910  Weight: 104.3 kg (230 lb)    Body mass index is 32.08 kg/m.  Estimated Creatinine Clearance: 175 mL/min (by C-G formula based on SCr of 0.69 mg/dL).   Based on [facility] policy patient is candidate for enoxaparin 0.5mg /kg TBW SQ every 24 hours based on BMI being >30.   DESCRIPTION: Pharmacy has adjusted enoxaparin dose per [facility] policy.  Patient is now receiving enoxaparin 52.5 mg every 24 hours    [facility], PharmD Clinical Pharmacist  07/10/2021 10:44 AM

## 2021-07-10 NOTE — ED Notes (Signed)
Niu, MD at bedside. °

## 2021-07-10 NOTE — ED Provider Notes (Signed)
[facility] Provider Note    Event Date/Time   First MD Initiated Contact with Patient 07/10/21 [phone]     (approximate)   History   Abdominal Pain   HPI  Cameron Santiago is a 26 y.o. male with a history of alcohol induced pancreatitis who presents with epigastric pain x24 hours which he reports is steadily getting worse.  He reports he had taken a break from alcohol but drank again yesterday and soon after this pain started.  Positive nausea no vomiting.  Normal stools.  Review of medical records demonstrates the patient was admitted for pancreatitis on November 19, 2020     Physical Exam   Triage Vital Signs: ED Triage Vitals  Enc Vitals Group     BP 07/10/21 0913 (!) 162/115     Pulse Rate 07/10/21 0913 94     Resp 07/10/21 0913 20     Temp 07/10/21 0913 98.4 F (36.9 C)     Temp Source 07/10/21 0913 Oral     SpO2 07/10/21 0913 96 %     Weight 07/10/21 0910 104.3 kg (230 lb)     Height 07/10/21 0910 1.803 m (5\' 11" )     Head Circumference --      Peak Flow --      Pain Score 07/10/21 0910 10     Pain Loc --      Pain Edu? --      Excl. in GC? --     Most recent vital signs: Vitals:   07/10/21 0913  BP: (!) 162/115  Pulse: 94  Resp: 20  Temp: 98.4 F (36.9 C)  SpO2: 96%     General: Awake, uncomfortable CV:  Good peripheral perfusion.  Resp:  Normal effort.  Abd:  No distention.  Epigastric tenderness to palpation Other:     ED Results / Procedures / Treatments   Labs (all labs ordered are listed, but only abnormal results are displayed) Labs Reviewed  LIPASE, BLOOD - Abnormal; Notable for the following components:      Result Value   Lipase 352 (*)    All other components within normal limits  COMPREHENSIVE METABOLIC PANEL - Abnormal; Notable for the following components:   Glucose, Bld 136 (*)    Total Protein 9.5 (*)    AST 146 (*)    ALT 84 (*)    Total Bilirubin 2.4 (*)    All other components within normal  limits  CBC - Abnormal; Notable for the following components:   WBC 11.1 (*)    All other components within normal limits  URINALYSIS, ROUTINE W REFLEX MICROSCOPIC     EKG     RADIOLOGY Reviewed CT scan from May 26, 2021 which showed pancreatitis of the tip    PROCEDURES:  Critical Care performed:   Procedures   MEDICATIONS ORDERED IN ED: Medications  ondansetron (ZOFRAN) injection 4 mg (4 mg Intravenous Given 07/10/21 0924)  morphine (PF) 4 MG/ML injection 4 mg (4 mg Intravenous Given 07/10/21 0925)  0.9 %  sodium chloride infusion (1,000 mLs Intravenous New Bag/Given 07/10/21 1001)  morphine (PF) 4 MG/ML injection 4 mg (4 mg Intravenous Given 07/10/21 1001)     IMPRESSION / MDM / ASSESSMENT AND PLAN / ED COURSE  I reviewed the triage vital signs and the nursing notes. Patient's presentation is most consistent with acute presentation with potential threat to life or bodily function.  Patient presents with epigastric pain and abdominal pain as  detailed above.  Highly suspicious for pancreatitis given his history.  Differential also includes colitis, diverticulitis, gastritis  Lab work is notable for elevated lipase of 352 and an elevated white blood cell count, consistent with pancreatitis  Patient had recent CT scan approximately 1 month ago and given his young age we we will try to avoid reimaging  Patient treated with IV morphine x2 doses and IV Zofran  IV fluids are infusing.  Patient's pain is still significant he will require admission to the hospitalist service, I have consulted the hospitalist        FINAL CLINICAL IMPRESSION(S) / ED DIAGNOSES   Final diagnoses:  Alcohol-induced acute pancreatitis, unspecified complication status     Rx / DC Orders   ED Discharge Orders     None        Note:  This document was prepared using Dragon voice recognition software and may include unintentional dictation errors.   Jene Every, MD 07/10/21  1005

## 2021-07-10 NOTE — Assessment & Plan Note (Addendum)
Continue Pepcid  

## 2021-07-10 NOTE — H&P (Signed)
History and Physical    Cameron Santiago DOB: 01-02-1996 DOA: 07/10/2021  Referring MD/NP/PA:   PCP: [facility]   Patient coming from:  The patient is coming from home.  At baseline, pt is independent for most of ADL.        Chief Complaint: Abdominal pain  HPI: Cameron Santiago is a 26 y.o. male with medical history significant of alcohol abuse, alcoholic pancreatitis, GERD, who presents with abdominal pain.  Patient states that his abdominal pain started yesterday, which is located in the left upper quadrant, sharp, 10 out of 10 in severity, radiating to the left back, associated with nausea and multiple episodes of nonbilious nonbloody vomiting.  No diarrhea.  No fever or chills.  Patient states that the abdominal pain is aggravated by eating food.  He has diaphoresis.  Denies chest pain, cough, shortness breath.  No symptoms of UTI.  Data Reviewed and ED Course: pt was found to have lipase 352, WBC 11.1, abnormal liver function (ALP 120, AST 146, ALT 84, total bilirubin 2.4), temperature normal, blood pressure 164/103, heart rate 94, RR 20, oxygen saturation 96% on room air.  Patient is placed on MedSurg bed for observation.  CT-abd/pelvis: There is marked edema adjacent to body and tail of pancreas consistent with acute pancreatitis. There is no evidence of focal pancreatic necrosis. There are no loculated fluid collections in or around the pancreas.   There is no evidence of intestinal obstruction or pneumoperitoneum. There is no hydronephrosis.   There is marked enlargement of liver with severe fatty infiltration. Enlarged spleen. Small linear densities in the posterior lower lung fields may suggest subsegmental atelectasis.   Other findings as described in the body of the report.   EKG:  Not done in ED, will get one.   Review of Systems:   General: no fevers, chills, no body weight gain, has poor appetite, has fatigue HEENT:  no blurry vision, hearing changes or sore throat Respiratory: no dyspnea, coughing, wheezing CV: no chest pain, no palpitations GI: has nausea, vomiting, abdominal pain, no diarrhea, constipation GU: no dysuria, burning on urination, increased urinary frequency, hematuria  Ext: no leg edema Neuro: no unilateral weakness, numbness, or tingling, no vision change or hearing loss Skin: no rash, no skin tear. MSK: No muscle spasm, no deformity, no limitation of range of movement in spin Heme: No easy bruising.  Travel history: No recent long distant travel.   Allergy: No Known Allergies  Past Medical History:  Diagnosis Date   Alcohol abuse    Alcoholic pancreatitis    GERD (gastroesophageal reflux disease)     Past Surgical History:  Procedure Laterality Date   APPENDECTOMY     LAPAROSCOPIC APPENDECTOMY N/A 02/15/2015   Procedure: APPENDECTOMY LAPAROSCOPIC;  Surgeon: Natale Lay, MD;  Location: [facility] ORS;  Service: General;  Laterality: N/A;   TONSILLECTOMY      Social History:  reports that he has never smoked. He has never used smokeless tobacco. He reports current alcohol use. He reports that he does not use drugs.  Family History:  Family History  Problem Relation Age of Onset   Arthritis Mother      Prior to Admission medications   Medication Sig Start Date End Date Taking? Authorizing Provider  albuterol (VENTOLIN HFA) 108 (90 Base) MCG/ACT inhaler Inhale 2 puffs into the lungs every 6 (six) hours as needed for wheezing or shortness of breath. Patient not taking: Reported on 11/19/2020 11/22/19  Lucy Chrisodgers, Caitlin J, PA  famotidine (PEPCID) 20 MG tablet Take 1 tablet (20 mg total) by mouth 2 (two) times daily. 04/30/21   Sharman CheekStafford, Phillip, MD  ondansetron (ZOFRAN-ODT) 4 MG disintegrating tablet Take 1 tablet (4 mg total) by mouth every 8 (eight) hours as needed for nausea or vomiting. 04/30/21   Sharman CheekStafford, Phillip, MD    Physical Exam: Vitals:   07/10/21 1245 07/10/21 1300  07/10/21 1315 07/10/21 1343  BP:    (!) 165/105  Pulse: (!) 109 85 80   Resp: 19 (!) 25 (!) 21 20  Temp:      TempSrc:      SpO2: 95% 96% 96% 96%  Weight:      Height:       General: Not in acute distress HEENT:       Eyes: PERRL, EOMI, no scleral icterus.       ENT: No discharge from the ears and nose, no pharynx injection, no tonsillar enlargement.        Neck: No JVD, no bruit, no mass felt. Heme: No neck lymph node enlargement. Cardiac: S1/S2, RRR, No murmurs, No gallops or rubs. Respiratory: No rales, wheezing, rhonchi or rubs. GI: Soft, nondistended, has severe diffuse abdominal t enderness, worse on the left upper quadrant, no organomegaly, BS present. GU: No hematuria Ext: No pitting leg edema bilaterally. 1+DP/PT pulse bilaterally. Musculoskeletal: No joint deformities, No joint redness or warmth, no limitation of ROM in spin. Skin: No rashes.  Neuro: Alert, oriented X3, cranial nerves II-XII grossly intact, moves all extremities normally. Psych: Patient is not psychotic, no suicidal or hemocidal ideation.  Labs on Admission: I have personally reviewed following labs and imaging studies  CBC: Recent Labs  Lab 07/10/21 0917  WBC 11.1*  HGB 16.3  HCT 47.6  MCV 96.6  PLT 291   Basic Metabolic Panel: Recent Labs  Lab 07/10/21 0917  NA 138  K 3.8  CL 99  CO2 25  GLUCOSE 136*  BUN 7  CREATININE 0.69  CALCIUM 9.4   GFR: Estimated Creatinine Clearance: 175 mL/min (by C-G formula based on SCr of 0.69 mg/dL). Liver Function Tests: Recent Labs  Lab 07/10/21 0917  AST 146*  ALT 84*  ALKPHOS 120  BILITOT 2.4*  PROT 9.5*  ALBUMIN 4.6   Recent Labs  Lab 07/10/21 0917  LIPASE 352*   No results for input(s): AMMONIA in the last 168 hours. Coagulation Profile: No results for input(s): INR, PROTIME in the last 168 hours. Cardiac Enzymes: No results for input(s): CKTOTAL, CKMB, CKMBINDEX, TROPONINI in the last 168 hours. BNP (last 3 results) No results  for input(s): PROBNP in the last 8760 hours. HbA1C: No results for input(s): HGBA1C in the last 72 hours. CBG: No results for input(s): GLUCAP in the last 168 hours. Lipid Profile: Recent Labs    07/10/21 0917  TRIG 266*   Thyroid Function Tests: No results for input(s): TSH, T4TOTAL, FREET4, T3FREE, THYROIDAB in the last 72 hours. Anemia Panel: No results for input(s): VITAMINB12, FOLATE, FERRITIN, TIBC, IRON, RETICCTPCT in the last 72 hours. Urine analysis:    Component Value Date/Time   COLORURINE YELLOW (A) 07/10/2021 0917   APPEARANCEUR CLEAR (A) 07/10/2021 0917   LABSPEC >1.046 (H) 07/10/2021 0917   PHURINE 6.0 07/10/2021 0917   GLUCOSEU NEGATIVE 07/10/2021 0917   HGBUR NEGATIVE 07/10/2021 0917   BILIRUBINUR NEGATIVE 07/10/2021 0917   KETONESUR 20 (A) 07/10/2021 0917   PROTEINUR 100 (A) 07/10/2021 0917   NITRITE NEGATIVE  07/10/2021 0917   LEUKOCYTESUR NEGATIVE 07/10/2021 0917   Sepsis Labs: (procalcitonin:4,lacticidven:4) )No results found for this or any previous visit (from the past 240 hour(s)).   Radiological Exams on Admission: CT ABDOMEN PELVIS W CONTRAST  Result Date: 07/10/2021 CLINICAL DATA:  Abdominal pain in the left lower quadrant EXAM: CT ABDOMEN AND PELVIS WITH CONTRAST TECHNIQUE: Multidetector CT imaging of the abdomen and pelvis was performed using the standard protocol following bolus administration of intravenous contrast. RADIATION DOSE REDUCTION: This exam was performed according to the departmental dose-optimization program which includes automated exposure control, adjustment of the mA and/or kV according to patient size and/or use of iterative reconstruction technique. CONTRAST:  OMNIPAQUE IOHEXOL 300 MG/ML  SOLN COMPARISON:  05/26/2021 FINDINGS: Lower chest: Small patchy linear densities in the posterior lower lung fields may suggest subsegmental atelectasis. Heart is enlarged in size. There is no pleural effusion. Hepatobiliary:  There is marked enlargement of liver measuring 29.6 cm in length. There is marked fatty infiltration in the liver. There is no dilation of bile ducts. Gallbladder is unremarkable. Pancreas: There is edema adjacent to body and tail of pancreas extending into the mesentery. There is no loculated fluid collection in or around the pancreas. There is homogeneous enhancement in the pancreas. There is no dilation of pancreatic duct. Spleen: Spleen measures 13.2 cm in maximum diameter. Adrenals/Urinary Tract: Adrenals are not enlarged. There is no hydronephrosis. There are no renal or ureteral stones. Urinary bladder is unremarkable. Stomach/Bowel: Stomach is not distended. Stranding in the fat planes along the greater curvature aspect of the stomach may be related to pancreatitis. There is no dilation of small-bowel loops. Appendix is not seen. There is no pericecal inflammation. There is no significant wall thickening in colon. Vascular/Lymphatic: Unremarkable. Reproductive: Unremarkable. Other: There is no ascites or pneumoperitoneum. Small left inguinal hernia containing fat is seen. Musculoskeletal: No acute findings are seen. IMPRESSION: There is marked edema adjacent to body and tail of pancreas consistent with acute pancreatitis. There is no evidence of focal pancreatic necrosis. There are no loculated fluid collections in or around the pancreas. There is no evidence of intestinal obstruction or pneumoperitoneum. There is no hydronephrosis. There is marked enlargement of liver with severe fatty infiltration. Enlarged spleen. Small linear densities in the posterior lower lung fields may suggest subsegmental atelectasis. Other findings as described in the body of the report. Electronically Signed   By: Ernie Avena M.D.   On: 07/10/2021 11:51      Assessment/Plan Principal Problem:   Acute recurrent pancreatitis Active Problems:   Abnormal LFTs   Alcohol abuse   GERD (gastroesophageal reflux  disease)   Elevated blood pressure reading   Principal Problem:   Acute recurrent pancreatitis Active Problems:   Abnormal LFTs   Alcohol abuse   GERD (gastroesophageal reflux disease)   Elevated blood pressure reading   Assessment and Plan: * Acute recurrent pancreatitis This is most likely due to alcoholic pancreatitis. Lipase 352. TG level 266. CT scan showed marked edema adjacent to body and tail of pancreas consistent with acute pancreatitis. There is no evidence of focal pancreatic necrosis. There are no loculated fluid collections in or around the pancreas.  -Place in med-surg for obs -prn dilaudid, oxycodone for pain -IVF: 1L NS, then 150 cc/h -prn zofran    Elevated blood pressure reading Bp 164/103.  No hx of HTN, may be due to pain. -prn hydralazine  GERD (gastroesophageal reflux disease) -pepcide  Alcohol abuse -did counseling about importance of  quitting alcohol use -CiWA  Abnormal LFTs Likely due to alcohol abuse -check hepatitis panel -avoid using tylenol             DVT ppx: SQ Lovenox  Code Status: Full code  Family Communication: not done, no family member is at bed side.     Disposition Plan:  Anticipate discharge back to previous environment  Consults called:  none  Admission status and Level of care: Med-Surg:    for obs        Severity of Illness:  The appropriate patient status for this patient is OBSERVATION. Observation status is judged to be reasonable and necessary in order to provide the required intensity of service to ensure the patient's safety. The patient's presenting symptoms, physical exam findings, and initial radiographic and laboratory data in the context of their medical condition is felt to place them at decreased risk for further clinical deterioration. Furthermore, it is anticipated that the patient will be medically stable for discharge from the hospital within 2 midnights of admission.        Date of  Service 07/10/2021    Lorretta Harp [facility]   If 7PM-7AM, please contact night-coverage [email or web address] 07/10/2021, 2:09 PM

## 2021-07-10 NOTE — Assessment & Plan Note (Addendum)
POA, improving.  Likely due to alcohol abuse. On admission, AST 146, ALT 84, T. bili 2.4.  Acute hepatitis panel negative --Avoid Tylenol -- Follow CMP  5/22: ALT is normalized, AST improving, 47.  T. bili continues to improve 1.7

## 2021-07-10 NOTE — ED Triage Notes (Addendum)
Pt via POV from home. Pt c/o LLQ abd pain, states that he has not been able to keep anything down. Pt is very diaphoretic on arrival but states that he does not have a fever and pt states that he not normally this sweaty. Pt is A&Ox4 but seems uncomfortable. Pt has a hx of appendectomy.

## 2021-07-10 NOTE — Assessment & Plan Note (Addendum)
Patient counseled regarding the importance of cessation of alcohol and will have ongoing risk of recurrent bouts of pancreatitis that tend to get progressively worse over time. -- Reduce Librium 25 mg TID>>BID -- CIWA protocol Q4H with PRN Ativan -- Monitor closely  5/23: Appears withdrawal symptoms nearly resolved, remains hypertensive which might be related to abdominal pain.  No longer tachycardic.  CIWA scores recently 0

## 2021-07-10 NOTE — Assessment & Plan Note (Addendum)
Bp 164/103.  Likely elevated due to abdominal pain.  No prior history of chronic hypertension.  5/23: BP's remain elevated, presumed due to pain, but will start scheduled med  --Started amlodipine 5 mg daily --As needed IV hydralazine

## 2021-07-11 DIAGNOSIS — D72829 Elevated white blood cell count, unspecified: Secondary | ICD-10-CM | POA: Diagnosis present

## 2021-07-11 DIAGNOSIS — R7401 Elevation of levels of liver transaminase levels: Secondary | ICD-10-CM

## 2021-07-11 DIAGNOSIS — E876 Hypokalemia: Secondary | ICD-10-CM | POA: Diagnosis not present

## 2021-07-11 LAB — HEPATITIS PANEL, ACUTE
HCV Ab: NONREACTIVE
Hep A IgM: NONREACTIVE
Hep B C IgM: NONREACTIVE
Hepatitis B Surface Ag: NONREACTIVE

## 2021-07-11 LAB — CBC
HCT: 44.9 % (ref 39.0–52.0)
Hemoglobin: 15.5 g/dL (ref 13.0–17.0)
MCH: 33.8 pg (ref 26.0–34.0)
MCHC: 34.5 g/dL (ref 30.0–36.0)
MCV: 98 fL (ref 80.0–100.0)
Platelets: 246 10*3/uL (ref 150–400)
RBC: 4.58 MIL/uL (ref 4.22–5.81)
RDW: 12.9 % (ref 11.5–15.5)
WBC: 14.7 10*3/uL — ABNORMAL HIGH (ref 4.0–10.5)
nRBC: 0 % (ref 0.0–0.2)

## 2021-07-11 LAB — COMPREHENSIVE METABOLIC PANEL
ALT: 56 U/L — ABNORMAL HIGH (ref 0–44)
AST: 81 U/L — ABNORMAL HIGH (ref 15–41)
Albumin: 3.8 g/dL (ref 3.5–5.0)
Alkaline Phosphatase: 98 U/L (ref 38–126)
Anion gap: 16 — ABNORMAL HIGH (ref 5–15)
BUN: 5 mg/dL — ABNORMAL LOW (ref 6–20)
CO2: 24 mmol/L (ref 22–32)
Calcium: 7.9 mg/dL — ABNORMAL LOW (ref 8.9–10.3)
Chloride: 96 mmol/L — ABNORMAL LOW (ref 98–111)
Creatinine, Ser: 0.54 mg/dL — ABNORMAL LOW (ref 0.61–1.24)
GFR, Estimated: >60 mL/min (ref >60)
Glucose, Bld: 120 mg/dL — ABNORMAL HIGH (ref 70–99)
Potassium: 3.3 mmol/L — ABNORMAL LOW (ref 3.5–5.1)
Sodium: 136 mmol/L (ref 135–145)
Total Bilirubin: 3.2 mg/dL — ABNORMAL HIGH (ref 0.3–1.2)
Total Protein: 8.1 g/dL (ref 6.5–8.1)

## 2021-07-11 LAB — GLUCOSE, CAPILLARY: Glucose-Capillary: 141 mg/dL — ABNORMAL HIGH (ref 70–99)

## 2021-07-11 MED ORDER — POTASSIUM CHLORIDE 10 MEQ/100ML IV SOLN
10.0000 meq | INTRAVENOUS | Status: AC
  Administered 2021-07-11 (×3): 10 meq via INTRAVENOUS
  Filled 2021-07-11 (×3): qty 100

## 2021-07-11 MED ORDER — HYDRALAZINE HCL 20 MG/ML IJ SOLN
10.0000 mg | INTRAMUSCULAR | Status: DC | PRN
  Administered 2021-07-11 – 2021-07-15 (×7): 10 mg via INTRAVENOUS
  Filled 2021-07-11 (×7): qty 1

## 2021-07-11 MED ORDER — CHLORDIAZEPOXIDE HCL 25 MG PO CAPS
25.0000 mg | ORAL_CAPSULE | Freq: Three times a day (TID) | ORAL | Status: DC
  Administered 2021-07-11 – 2021-07-14 (×11): 25 mg via ORAL
  Filled 2021-07-11 (×11): qty 1

## 2021-07-11 MED ORDER — LORAZEPAM 2 MG/ML IJ SOLN: 0.0000 mg | Freq: Two times a day (BID) | INTRAMUSCULAR | Status: DC

## 2021-07-11 MED ORDER — HYDRALAZINE HCL 20 MG/ML IJ SOLN: 10.0000 mg | INTRAMUSCULAR | Status: DC | PRN

## 2021-07-11 MED ORDER — LORAZEPAM 2 MG/ML IJ SOLN
2.0000 mg | Freq: Once | INTRAMUSCULAR | Status: AC
  Administered 2021-07-11: 2 mg via INTRAVENOUS
  Filled 2021-07-11: qty 1

## 2021-07-11 MED ORDER — LORAZEPAM 2 MG/ML IJ SOLN
0.0000 mg | INTRAMUSCULAR | Status: AC
  Administered 2021-07-11 (×3): 2 mg via INTRAVENOUS
  Administered 2021-07-12 – 2021-07-14 (×2): 1 mg via INTRAVENOUS
  Filled 2021-07-11 (×5): qty 1

## 2021-07-11 NOTE — Plan of Care (Signed)

## 2021-07-11 NOTE — Assessment & Plan Note (Addendum)
K 3.5, resolved with extra replacement. Remains on NS+20 KCl fluids. Monitor BMP replace K as needed. Also monitor Mg

## 2021-07-11 NOTE — Assessment & Plan Note (Addendum)
Resolved.  Suspect reactive in the setting of acute pancreatitis, nausea vomiting.  No fevers or other evidence of infection at this time. WBC on admission 11.1 >> 14.7 >> 13.8 today. --Monitor clinically for signs and symptoms of infection --No need for antibiotics at this time

## 2021-07-11 NOTE — Progress Notes (Signed)
Vitals signs recheck [phone]. Pulse 108. Green mews. Pain 10 of 10 treated. Continuing to monitor.   07/11/21 0732  Assess: MEWS Score  Temp 98.9 F (37.2 C)  BP (!) 172/116  Pulse Rate (!) 115  Resp 20  Level of Consciousness Alert  SpO2 96 %  O2 Device Room Air  Assess: MEWS Score  MEWS Temp 0  MEWS Systolic 0  MEWS Pulse 2  MEWS RR 0  MEWS LOC 0  MEWS Score 2  MEWS Score Color Yellow  Assess: if the MEWS score is Yellow or Red  Were vital signs taken at a resting state? Yes  Focused Assessment No change from prior assessment  Does the patient meet 2 or more of the SIRS criteria? No  MEWS guidelines implemented *See Row Information* No, vital signs rechecked (pulse 108 at recheck 0748)  Treat  Pain Scale 0-10  Pain Score 10  Pain Type Acute pain  Pain Location Abdomen  Pain Orientation Left;Lower  Pain Descriptors / Indicators Constant  Pain Frequency Constant  Pain Onset On-going  Pain Intervention(s) Medication (See eMAR)  Notify: Charge Nurse/RN  Name of Charge Nurse/RN Notified Albertha Ghee, RN  Date Charge Nurse/RN Notified 07/11/21  Time Charge Nurse/RN Notified [phone]  Document  Patient Outcome Other (Comment) (continuing to monitor)  Progress note created (see row info) Yes  Assess: SIRS CRITERIA  SIRS Temperature  0  SIRS Pulse 1  SIRS Respirations  0  SIRS WBC 0  SIRS Score Sum  1

## 2021-07-11 NOTE — Hospital Course (Addendum)
Cameron Santiago is a 26 y.o. male with medical history significant of alcohol abuse, alcoholic pancreatitis, GERD, who presented to the ED on 07/10/2021 with progressively worsening severe abdominal pain, nausea and vomiting. He was admitted to the hospital for recurrence of acute alcoholic pancreatitis.  Started on IV fluids, antiemetics and analgesics.  CIWA protocol for management of alcohol withdrawal.  Patient has been clinically improving. His CIWA score has been scoring at 0 for >48 hrs-and Librium is being tapered off.  He has been tolerating diet ambulating well.  He will schedule for discharge home today.  I discussed in length about alcohol cessation.

## 2021-07-11 NOTE — Progress Notes (Addendum)
Progress Note   Patient: Cameron Santiago HCW:[id number] DOB: 08-Nov-1995 DOA: 07/10/2021     0 DOS: the patient was seen and examined on 07/11/2021   Brief hospital course: FREDDIE DYMEK is a 26 y.o. male with medical history significant of alcohol abuse, alcoholic pancreatitis, GERD, who presented to the ED on 07/10/2021 with progressively worsening severe abdominal pain, nausea and vomiting.    He was admitted to the hospital for recurrence of acute alcoholic pancreatitis.  Started on IV fluids, antiemetics and analgesics.  CIWA protocol for management of alcohol withdrawal.   Assessment and Plan: * Acute recurrent pancreatitis Presented with abdominal pain, nausea and vomiting.  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  Lipase 352.  Triglycerides 266.  CT abdomen pelvis showed marked edema adjacent to body and tail of pancreas consistent with acute pancreatitis, no evidence of focal pancreatic necrosis, no loculated fluid collections in or around the pancreas. -- N.p.o. except for ice, meds --Advance diet as tolerated --Continue IV fluids, antiemetics, analgesics --Monitor abdominal exam closely   Hypokalemia K3.3 this morning. Replace with K riders. Monitor BMP replace K as needed. Also monitor Mg  Leukocytosis Suspect reactive in the setting of acute pancreatitis, nausea vomiting.  No fevers or other evidence of infection at this time. WBC on admission 11.1 >> 14.7 today. --Monitor clinically for signs and symptoms of infection --No need for antibiotics at this time   Elevated blood pressure reading Bp 164/103.  Likely elevated due to abdominal pain.  No prior history of chronic hypertension. --As needed IV hydralazine  GERD (gastroesophageal reflux disease) -- Continue Pepcid  Alcohol dependence with withdrawal (HCC) Patient counseled regarding the importance of cessation of alcohol and will have ongoing risk of recurrent bouts of  pancreatitis that tend to get progressively worse over time. -- Start Librium 25 mg 3 times daily -- Continue CIWA protocol Q4H with as needed Ativan -- Monitor closely  5/19: Mews 5 this afternoon, tachycardic and tachypneic.  Appears to be developing withdrawal symptoms.  Transaminitis Likely due to alcohol abuse. On admission, AST 146, ALT 84, T. bili 2.4.  Acute hepatitis panel negative --Avoid Tylenol -- Follow CMP  5/19: AST and ALT improved but T. bili further elevated 3.2        Subjective: Patient awake resting in bed when seen on rounds today.  He reports feeling about the same as yesterday in regards to his abdominal pain nausea vomiting.  Denies having tremors and does not appear tremulous at this time.  Denies fevers chills.  Not yet ready to try eating and drinking.  Physical Exam: Vitals:   07/11/21 0732 07/11/21 0748 07/11/21 0900 07/11/21 1150  BP: (!) 172/116  (!) 157/100 (!) 124/106  Pulse: (!) 115 (!) 108 (!) 117 (!) 119  Resp: 20   (!) 26  Temp: 98.9 F (37.2 C)   (!) 101.3 F (38.5 C)  TempSrc: Oral   Oral  SpO2: 96%   92%  Weight:      Height:       General exam: awake, alert, no acute distress HEENT: moist mucus membranes, hearing grossly normal  Respiratory system: CTAB, no wheezes, rales or rhonchi, normal respiratory effort. Cardiovascular system: normal S1/S2, RRR, no pedal edema.   Gastrointestinal system: soft, epigastric tenderness on palpation with guarding no rebound, bowel sounds present Central nervous system: A&O x3. no gross focal neurologic deficits, normal speech Extremities: moves all, no edema, normal tone Skin: dry, intact, normal  temperature, normal color, No rashes, lesions or ulcers Psychiatry: normal mood, congruent affect, judgement and insight appear normal   Data Reviewed:  Notable labs: Potassium 3.3, chloride 96, glucose 120, BUN 5, creatinine 0.54, calcium 7.9, anion gap 16, AST improved 81, ALT improved to 56, T.  bili increased from 2.4 up to 3.2.  CBC with white count 14.7, up from 11.1.  Acute hepatitis panel negative  Family Communication: None  Disposition: Status is: Inpatient Remains inpatient appropriate because: Severity of illness as outlined above, remains on IV fluids and medications   Planned Discharge Destination: Home    Time spent: 35 minutes  Author: Pennie Banter, DO 07/11/2021 12:31 PM  For on call review [email or web address].

## 2021-07-12 DIAGNOSIS — K5900 Constipation, unspecified: Secondary | ICD-10-CM | POA: Clinically undetermined

## 2021-07-12 DIAGNOSIS — F10230 Alcohol dependence with withdrawal, uncomplicated: Secondary | ICD-10-CM

## 2021-07-12 LAB — COMPREHENSIVE METABOLIC PANEL
ALT: 40 U/L (ref 0–44)
AST: 67 U/L — ABNORMAL HIGH (ref 15–41)
Albumin: 3.3 g/dL — ABNORMAL LOW (ref 3.5–5.0)
Alkaline Phosphatase: 80 U/L (ref 38–126)
Anion gap: 11 (ref 5–15)
BUN: 7 mg/dL (ref 6–20)
CO2: 27 mmol/L (ref 22–32)
Calcium: 7.5 mg/dL — ABNORMAL LOW (ref 8.9–10.3)
Chloride: 98 mmol/L (ref 98–111)
Creatinine, Ser: 0.5 mg/dL — ABNORMAL LOW (ref 0.61–1.24)
GFR, Estimated: >60 mL/min (ref >60)
Glucose, Bld: 107 mg/dL — ABNORMAL HIGH (ref 70–99)
Potassium: 3.5 mmol/L (ref 3.5–5.1)
Sodium: 136 mmol/L (ref 135–145)
Total Bilirubin: 3.6 mg/dL — ABNORMAL HIGH (ref 0.3–1.2)
Total Protein: 7.2 g/dL (ref 6.5–8.1)

## 2021-07-12 LAB — CBC
HCT: 42.6 % (ref 39.0–52.0)
Hemoglobin: 14.2 g/dL (ref 13.0–17.0)
MCH: 33 pg (ref 26.0–34.0)
MCHC: 33.3 g/dL (ref 30.0–36.0)
MCV: 99.1 fL (ref 80.0–100.0)
Platelets: 176 10*3/uL (ref 150–400)
RBC: 4.3 MIL/uL (ref 4.22–5.81)
RDW: 13.2 % (ref 11.5–15.5)
WBC: 13.8 10*3/uL — ABNORMAL HIGH (ref 4.0–10.5)
nRBC: 0 % (ref 0.0–0.2)

## 2021-07-12 LAB — GLUCOSE, CAPILLARY
Glucose-Capillary: 77 mg/dL (ref 70–99)
Glucose-Capillary: 122 mg/dL — ABNORMAL HIGH (ref 70–99)

## 2021-07-12 LAB — MAGNESIUM: Magnesium: 1.3 mg/dL — ABNORMAL LOW (ref 1.7–2.4)

## 2021-07-12 MED ORDER — SENNOSIDES-DOCUSATE SODIUM 8.6-50 MG PO TABS
1.0000 | ORAL_TABLET | Freq: Two times a day (BID) | ORAL | Status: DC
  Administered 2021-07-12 – 2021-07-16 (×6): 1 via ORAL
  Filled 2021-07-12 (×7): qty 1

## 2021-07-12 MED ORDER — MAGNESIUM SULFATE 4 GM/100ML IV SOLN
4.0000 g | Freq: Once | INTRAVENOUS | Status: AC
  Administered 2021-07-12: 4 g via INTRAVENOUS
  Filled 2021-07-12: qty 100

## 2021-07-12 NOTE — Progress Notes (Signed)
Progress Note   Patient: Cameron Santiago SWN:[id number] DOB: 10-28-1995 DOA: 07/10/2021     1 DOS: the patient was seen and examined on 07/12/2021   Brief hospital course: Cameron Santiago is a 26 y.o. male with medical history significant of alcohol abuse, alcoholic pancreatitis, GERD, who presented to the ED on 07/10/2021 with progressively worsening severe abdominal pain, nausea and vomiting.    He was admitted to the hospital for recurrence of acute alcoholic pancreatitis.  Started on IV fluids, antiemetics and analgesics.  CIWA protocol for management of alcohol withdrawal.   Assessment and Plan: * Acute recurrent pancreatitis Presented with abdominal pain, nausea and vomiting.  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  Lipase 352.  Triglycerides 266.  CT abdomen pelvis showed marked edema adjacent to body and tail of pancreas consistent with acute pancreatitis, no evidence of focal pancreatic necrosis, no loculated fluid collections in or around the pancreas. --Trial on clear liquid diet --Advance diet as tolerated --Continue IV fluids, antiemetics, analgesics --Monitor abdominal exam closely   Alcohol dependence with withdrawal (HCC) Patient counseled regarding the importance of cessation of alcohol and will have ongoing risk of recurrent bouts of pancreatitis that tend to get progressively worse over time. -- Continue Librium 25 mg 3 times daily -- CIWA protocol Q4H with PRN Ativan -- Monitor closely  5/19: Mews 5 this afternoon, tachycardic and tachypneic.  Appears to be developing withdrawal symptoms.  5/20: CIWA scores lower, still tachycardic, hypertensive.  Ongoing tremors.  Hypomagnesemia Mg 1.3 this AM (5/20) - replacing 4 g IV Mg-sulfate.  Monitor replace as needed.  Hypokalemia K3.3 this morning. Replace with K riders. Monitor BMP replace K as needed. Also monitor Mg  Leukocytosis Suspect reactive in the setting of acute  pancreatitis, nausea vomiting.  No fevers or other evidence of infection at this time. WBC on admission 11.1 >> 14.7 >> 13.8 today. --Monitor clinically for signs and symptoms of infection --No need for antibiotics at this time   Transaminitis Likely due to alcohol abuse. On admission, AST 146, ALT 84, T. bili 2.4.  Acute hepatitis panel negative --Avoid Tylenol -- Follow CMP  5/19: AST and ALT improved but T. bili further elevated 3.2  Elevated blood pressure reading Bp 164/103.  Likely elevated due to abdominal pain.  No prior history of chronic hypertension. --As needed IV hydralazine  GERD (gastroesophageal reflux disease) -- Continue Pepcid  Constipation Senna-S BID for now. Continue hydration. Mobilize as tolerated.        Subjective: Patient awake resting in bed when seen on rounds today.  Feels slightly better today.  Still having tremors.  He denies hallucinations.  Agreeable to attempt clear liquid diet today.   Physical Exam: Vitals:   07/12/21 0436 07/12/21 0500 07/12/21 0744 07/12/21 1213  BP: (!) 141/98 (!) 148/95 (!) 161/104 133/66  Pulse: (!) 121 (!) 107 (!) 127 (!) 110  Resp: 18 18 18 16   Temp: 99.7 F (37.6 C) 99.4 F (37.4 C) 98.6 F (37 C) 98.7 F (37.1 C)  TempSrc: Oral  Oral Oral  SpO2: 92% 93% 94% 93%  Weight:      Height:       General exam: awake, alert, no acute distress Respiratory system: on room air, normal respiratory effort. Cardiovascular system: brisk cap refill, no pedal edema.   Gastrointestinal system: distended, epigastric tenderness, no guarding no rebound Central nervous system: A&O x3. no gross focal neurologic deficits, normal speech Extremities: moves all, no  edema, normal tone Skin: dry, intact, normal temperature Psychiatry: normal mood, flat affect, judgement and insight appear normal   Data Reviewed:  Notable labs: Glucose 107, creatinine 0.50, calcium 7.5 with albumin 3.3, corrected calcium 8.1, magnesium low  1.3, total bili 3.6 up from 3.2, white count improved to 13.8  Family Communication: None  Disposition: Status is: Inpatient Remains inpatient appropriate because: Severity of illness as outlined above, remains on IV fluids and medications   Planned Discharge Destination: Home    Time spent: 35 minutes  Author: Pennie Banter, DO 07/12/2021 1:25 PM  For on call review [email or web address].

## 2021-07-12 NOTE — Assessment & Plan Note (Addendum)
Resolved after replacement on 5/24 Mg 1.3.  Monitor replace as needed.  Mg: 1.7

## 2021-07-12 NOTE — Assessment & Plan Note (Signed)
Senna-S BID for now. Continue hydration. Mobilize as tolerated.

## 2021-07-13 LAB — COMPREHENSIVE METABOLIC PANEL
ALT: 36 U/L (ref 0–44)
AST: 60 U/L — ABNORMAL HIGH (ref 15–41)
Albumin: 3.1 g/dL — ABNORMAL LOW (ref 3.5–5.0)
Alkaline Phosphatase: 83 U/L (ref 38–126)
Anion gap: 9 (ref 5–15)
BUN: 6 mg/dL (ref 6–20)
CO2: 28 mmol/L (ref 22–32)
Calcium: 7.6 mg/dL — ABNORMAL LOW (ref 8.9–10.3)
Chloride: 97 mmol/L — ABNORMAL LOW (ref 98–111)
Creatinine, Ser: 0.49 mg/dL — ABNORMAL LOW (ref 0.61–1.24)
GFR, Estimated: >60 mL/min (ref >60)
Glucose, Bld: 117 mg/dL — ABNORMAL HIGH (ref 70–99)
Potassium: 2.9 mmol/L — ABNORMAL LOW (ref 3.5–5.1)
Sodium: 134 mmol/L — ABNORMAL LOW (ref 135–145)
Total Bilirubin: 2.5 mg/dL — ABNORMAL HIGH (ref 0.3–1.2)
Total Protein: 7.2 g/dL (ref 6.5–8.1)

## 2021-07-13 LAB — GLUCOSE, CAPILLARY: Glucose-Capillary: 116 mg/dL — ABNORMAL HIGH (ref 70–99)

## 2021-07-13 LAB — CBC
HCT: 38.6 % — ABNORMAL LOW (ref 39.0–52.0)
Hemoglobin: 13 g/dL (ref 13.0–17.0)
MCH: 33.2 pg (ref 26.0–34.0)
MCHC: 33.7 g/dL (ref 30.0–36.0)
MCV: 98.5 fL (ref 80.0–100.0)
Platelets: 174 10*3/uL (ref 150–400)
RBC: 3.92 MIL/uL — ABNORMAL LOW (ref 4.22–5.81)
RDW: 12.9 % (ref 11.5–15.5)
WBC: 8.8 10*3/uL (ref 4.0–10.5)
nRBC: 0 % (ref 0.0–0.2)

## 2021-07-13 LAB — MAGNESIUM: Magnesium: 2 mg/dL (ref 1.7–2.4)

## 2021-07-13 LAB — PHOSPHORUS: Phosphorus: 1.4 mg/dL — ABNORMAL LOW (ref 2.5–4.6)

## 2021-07-13 MED ORDER — POTASSIUM PHOSPHATES 15 MMOLE/5ML IV SOLN
30.0000 mmol | Freq: Once | INTRAVENOUS | Status: AC
  Administered 2021-07-13: 30 mmol via INTRAVENOUS
  Filled 2021-07-13: qty 10

## 2021-07-13 MED ORDER — POTASSIUM CHLORIDE IN NACL 20-0.9 MEQ/L-% IV SOLN
INTRAVENOUS | Status: DC
  Filled 2021-07-13 (×7): qty 1000

## 2021-07-13 NOTE — Progress Notes (Signed)
Progress Note   Patient: Cameron Santiago [id number] DOB: Oct 29, 1995 DOA: 07/10/2021     2 DOS: the patient was seen and examined on 07/13/2021   Brief hospital course: GLENDEN DANBURY is a 26 y.o. male with medical history significant of alcohol abuse, alcoholic pancreatitis, GERD, who presented to the ED on 07/10/2021 with progressively worsening severe abdominal pain, nausea and vomiting.    He was admitted to the hospital for recurrence of acute alcoholic pancreatitis.  Started on IV fluids, antiemetics and analgesics.  CIWA protocol for management of alcohol withdrawal.   Assessment and Plan: * Acute recurrent pancreatitis Presented with abdominal pain, nausea and vomiting.  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  Lipase 352.  Triglycerides 266.  CT abdomen pelvis showed marked edema adjacent to body and tail of pancreas consistent with acute pancreatitis, no evidence of focal pancreatic necrosis, no loculated fluid collections in or around the pancreas.  5/21: Tolerating clear liquids fairly well, ongoing epigastric pain no nausea vomiting  --Trial on full liquid diet --Advance diet as tolerated --Continue IV fluids, antiemetics, analgesics --Monitor abdominal exam closely   Alcohol dependence with withdrawal ([place]) Patient counseled regarding the importance of cessation of alcohol and will have ongoing risk of recurrent bouts of pancreatitis that tend to get progressively worse over time. -- Continue Librium 25 mg 3 times daily -- CIWA protocol Q4H with PRN Ativan -- Monitor closely  5/19: Mews 5 this afternoon, tachycardic and tachypneic.  Appears to be developing withdrawal symptoms.  5/20-21: CIWA scores lower, still tachycardic, hypertensive.  Ongoing tremors.  Hypomagnesemia Mg 1.3 this AM (5/20) - replacing 4 g IV Mg-sulfate.  Monitor replace as needed.  Hypokalemia K 2.9 this morning. Replace fluid additive and K-Phos. Monitor BMP  replace K as needed. Also monitor Mg  Leukocytosis Suspect reactive in the setting of acute pancreatitis, nausea vomiting.  No fevers or other evidence of infection at this time. WBC on admission 11.1 >> 14.7 >> 13.8 today. --Monitor clinically for signs and symptoms of infection --No need for antibiotics at this time   Transaminitis Likely due to alcohol abuse. On admission, AST 146, ALT 84, T. bili 2.4.  Acute hepatitis panel negative --Avoid Tylenol -- Follow CMP  5/19: AST and ALT improved but T. bili further elevated 3.2  Elevated blood pressure reading Bp 164/103.  Likely elevated due to abdominal pain.  No prior history of chronic hypertension. --As needed IV hydralazine  GERD (gastroesophageal reflux disease) -- Continue Pepcid  Hypophosphatemia Phosphorus this morning 1.4.  Replace with IV K-Phos.  Monitor and further replace as needed.   Constipation Senna-S BID for now. Continue hydration. Mobilize as tolerated.        Subjective: Patient awake resting in bed when seen on rounds today.  Reports having increased abdominal pain after trying to consume some of the clear liquid breakfast today.  He otherwise feels withdrawal symptoms slowly improving still has mild tremor with outstretched hands.  Otherwise denies acute complaints.  Had BM this morning with stool softeners started yesterday.  Physical Exam: Vitals:   07/12/21 1954 07/13/21 0004 07/13/21 0458 07/13/21 0728  BP: (!) 160/101 140/85 (!) 165/98 (!) 156/92  Pulse: (!) 106 (!) 109 (!) 109 (!) 105  Resp: 18 18 20 16   Temp: (!) 100.6 F (38.1 C) 98.2 F (36.8 C) 99.7 F (37.6 C) 98.4 F (36.9 C)  TempSrc: Oral Oral Oral Oral  SpO2: 97% 92% 95% 97%  Weight:  Height:       General exam: Awake, alert, no acute distress Respiratory system: Lungs clear bilaterally, normal respiratory effort, room air. Cardiovascular system: brisk cap refill, no pedal edema.   Gastrointestinal system: Abdomen  is distended, breath sounds present, epigastric tenderness Central nervous system: A&O x3. no gross focal neurologic deficits, normal speech Extremities: Moves all extremities, no edema Skin: Dry intact normal tone Psychiatry: normal mood, flat affect, judgement and insight appear normal   Data Reviewed:  Notable labs: Sodium 134, potassium 2.9, chloride 97, glucose 170, creatinine 0.49, calcium 7.6 with albumin 3.1, phosphorus 1.4, total bili 2.5 improved  Family Communication: None  Disposition: Status is: Inpatient Remains inpatient appropriate because: Severity of illness as outlined above, remains on IV fluids and medications   Planned Discharge Destination: Home    Time spent: 35 minutes  Author: Ezekiel Slocumb, DO 07/13/2021 3:14 PM  For on call review [email or web address].

## 2021-07-13 NOTE — Assessment & Plan Note (Addendum)
Resolved with replacement on 5/21 for phos 1.4, given IV K-Phos.   Monitor and further replace as needed.

## 2021-07-14 LAB — COMPREHENSIVE METABOLIC PANEL
ALT: 34 U/L (ref 0–44)
AST: 47 U/L — ABNORMAL HIGH (ref 15–41)
Albumin: 3.1 g/dL — ABNORMAL LOW (ref 3.5–5.0)
Alkaline Phosphatase: 81 U/L (ref 38–126)
Anion gap: 10 (ref 5–15)
BUN: <5 mg/dL — ABNORMAL LOW (ref 6–20)
CO2: 28 mmol/L (ref 22–32)
Calcium: 8.1 mg/dL — ABNORMAL LOW (ref 8.9–10.3)
Chloride: 99 mmol/L (ref 98–111)
Creatinine, Ser: 0.5 mg/dL — ABNORMAL LOW (ref 0.61–1.24)
GFR, Estimated: >60 mL/min (ref >60)
Glucose, Bld: 104 mg/dL — ABNORMAL HIGH (ref 70–99)
Potassium: 3.2 mmol/L — ABNORMAL LOW (ref 3.5–5.1)
Sodium: 137 mmol/L (ref 135–145)
Total Bilirubin: 1.7 mg/dL — ABNORMAL HIGH (ref 0.3–1.2)
Total Protein: 7.2 g/dL (ref 6.5–8.1)

## 2021-07-14 LAB — GLUCOSE, CAPILLARY
Glucose-Capillary: 91 mg/dL (ref 70–99)
Glucose-Capillary: 137 mg/dL — ABNORMAL HIGH (ref 70–99)

## 2021-07-14 LAB — PHOSPHORUS: Phosphorus: 2.9 mg/dL (ref 2.5–4.6)

## 2021-07-14 LAB — LIPASE, BLOOD: Lipase: 112 U/L — ABNORMAL HIGH (ref 11–51)

## 2021-07-14 LAB — MAGNESIUM: Magnesium: 1.8 mg/dL (ref 1.7–2.4)

## 2021-07-14 NOTE — Progress Notes (Signed)
Progress Note   Patient: Cameron Santiago [id number] DOB: 28-May-1995 DOA: 07/10/2021     3 DOS: the patient was seen and examined on 07/14/2021   Brief hospital course: KATELYN BRAXTON is a 26 y.o. male with medical history significant of alcohol abuse, alcoholic pancreatitis, GERD, who presented to the ED on 07/10/2021 with progressively worsening severe abdominal pain, nausea and vomiting.    He was admitted to the hospital for recurrence of acute alcoholic pancreatitis.  Started on IV fluids, antiemetics and analgesics.  CIWA protocol for management of alcohol withdrawal.   Assessment and Plan: * Acute recurrent pancreatitis Presented with abdominal pain, nausea and vomiting.  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  Lipase 352.  Triglycerides 266.  CT abdomen pelvis showed marked edema adjacent to body and tail of pancreas consistent with acute pancreatitis, no evidence of focal pancreatic necrosis, no loculated fluid collections in or around the pancreas.  5/21: Tolerating clear liquids fairly well, ongoing epigastric pain no nausea vomiting 5/22: Tolerating full liquids but still has significantly increased epigastric pain after any p.o. intake including water.  No further nausea vomiting.  -- Continue full liquid diet, hold off further advancement for today --Advance diet tomorrow with more improved --Repeat lipase level --Continue IV fluids, antiemetics, analgesics --Monitor abdominal exam closely --If worsening abdominal exam or pain unrelated to p.o. intake, repeat CT abdomen   Alcohol dependence with withdrawal ([place]) Patient counseled regarding the importance of cessation of alcohol and will have ongoing risk of recurrent bouts of pancreatitis that tend to get progressively worse over time. -- Continue Librium 25 mg 3 times daily -- CIWA protocol Q4H with PRN Ativan -- Monitor closely  5/19: Mews 5 this afternoon, tachycardic and tachypneic.   Appears to be developing withdrawal symptoms.  5/20-21: CIWA scores lower, still tachycardic, hypertensive.  Ongoing tremors.  Hypomagnesemia Resolved after replacement on 5/24 Mg 1.3.  Monitor replace as needed.  Hypokalemia K 3.2 this morning despite replacement for K2.9 yesterday. Replace with K riders IV Monitor BMP replace K as needed. Also monitor Mg  Leukocytosis Resolved.  Suspect reactive in the setting of acute pancreatitis, nausea vomiting.  No fevers or other evidence of infection at this time. WBC on admission 11.1 >> 14.7 >> 13.8 today. --Monitor clinically for signs and symptoms of infection --No need for antibiotics at this time   Transaminitis POA, improving.  Likely due to alcohol abuse. On admission, AST 146, ALT 84, T. bili 2.4.  Acute hepatitis panel negative --Avoid Tylenol -- Follow CMP  5/22: ALT is normalized, AST improving, 47.  T. bili continues to improve 1.7  Elevated blood pressure reading Bp 164/103.  Likely elevated due to abdominal pain.  No prior history of chronic hypertension. --As needed IV hydralazine  GERD (gastroesophageal reflux disease) -- Continue Pepcid  Hypophosphatemia Resolved with replacement on 5/21 for phos 1.4, given IV K-Phos.   Monitor and further replace as needed.   Constipation Senna-S BID for now. Continue hydration. Mobilize as tolerated.        Subjective: Patient awake resting in bed when seen on rounds today.  He continues to have significant epigastric abdominal pain exacerbated by any oral intake including water or with meals (currently on full liquids).  Denies nausea or vomiting however.  Tremor is now mild and improving.  No other acute complaints.  No fevers or chills.  Reports his girlfriend plans to bring his 31-year-old daughter to visit this afternoon, confirmed no  age restrictions with nursing staff.  Physical Exam: Vitals:   07/13/21 1934 07/14/21 0415 07/14/21 0650 07/14/21 0745  BP: (!)  167/113 (!) 167/99 (!) 161/94 (!) 168/103  Pulse: (!) 102 98  97  Resp: 16 16  18   Temp: 99.2 F (37.3 C) 99.4 F (37.4 C)  98 F (36.7 C)  TempSrc:      SpO2: 94% 92%  96%  Weight:      Height:       General exam: Awake resting in bed, no acute distress Respiratory system: On room air, normal respiratory effort. Cardiovascular system: Brisk cap refill, no pedal edema.   Gastrointestinal system: Abdomen remains distended with mostly epigastric tenderness on palpation with guarding but no rebound, bowel sounds present but hypoactive Central nervous system: Grossly nonfocal exam, normal speech, alert and oriented x3 Extremities: Moves all extremities, no edema Psychiatry: normal mood, flat affect, judgement and insight appear normal   Data Reviewed:  Notable labs:Potassium 3.2, glucose 104, BUN less than 5, creatinine 0.50, calcium 8.1 with albumin 3.1, AST improved 47, total bili improved 1.7  Family Communication: None  Disposition: Status is: Inpatient Remains inpatient appropriate because: Severity of illness as outlined above, remains on IV fluids and medications due on going abdominal pain, not tolerating adequate PO intake   Planned Discharge Destination: Home    Time spent: 35 minutes  Author: Ezekiel Slocumb, DO 07/14/2021 1:23 PM  For on call review [email or web address].

## 2021-07-15 LAB — COMPREHENSIVE METABOLIC PANEL
ALT: 32 U/L (ref 0–44)
AST: 43 U/L — ABNORMAL HIGH (ref 15–41)
Albumin: 3.4 g/dL — ABNORMAL LOW (ref 3.5–5.0)
Alkaline Phosphatase: 90 U/L (ref 38–126)
Anion gap: 11 (ref 5–15)
BUN: <5 mg/dL — ABNORMAL LOW (ref 6–20)
CO2: 26 mmol/L (ref 22–32)
Calcium: 8.8 mg/dL — ABNORMAL LOW (ref 8.9–10.3)
Chloride: 103 mmol/L (ref 98–111)
Creatinine, Ser: 0.5 mg/dL — ABNORMAL LOW (ref 0.61–1.24)
GFR, Estimated: >60 mL/min (ref >60)
Glucose, Bld: 107 mg/dL — ABNORMAL HIGH (ref 70–99)
Potassium: 3.5 mmol/L (ref 3.5–5.1)
Sodium: 140 mmol/L (ref 135–145)
Total Bilirubin: 1 mg/dL (ref 0.3–1.2)
Total Protein: 8 g/dL (ref 6.5–8.1)

## 2021-07-15 LAB — GLUCOSE, CAPILLARY
Glucose-Capillary: 98 mg/dL (ref 70–99)
Glucose-Capillary: 140 mg/dL — ABNORMAL HIGH (ref 70–99)

## 2021-07-15 LAB — PHOSPHORUS: Phosphorus: 4.4 mg/dL (ref 2.5–4.6)

## 2021-07-15 LAB — MAGNESIUM: Magnesium: 1.7 mg/dL (ref 1.7–2.4)

## 2021-07-15 MED ORDER — AMLODIPINE BESYLATE 5 MG PO TABS
5.0000 mg | ORAL_TABLET | Freq: Every day | ORAL | Status: DC
  Administered 2021-07-15 – 2021-07-16 (×2): 5 mg via ORAL
  Filled 2021-07-15 (×2): qty 1

## 2021-07-15 MED ORDER — CHLORDIAZEPOXIDE HCL 25 MG PO CAPS
25.0000 mg | ORAL_CAPSULE | Freq: Two times a day (BID) | ORAL | Status: DC
  Administered 2021-07-15 – 2021-07-16 (×3): 25 mg via ORAL
  Filled 2021-07-15 (×3): qty 1

## 2021-07-15 MED ORDER — OXYCODONE HCL 5 MG PO TABS
5.0000 mg | ORAL_TABLET | ORAL | Status: DC | PRN
  Administered 2021-07-15 – 2021-07-16 (×4): 10 mg via ORAL
  Filled 2021-07-15 (×4): qty 2

## 2021-07-15 MED ORDER — SODIUM CHLORIDE 0.9 % IV SOLN
INTRAVENOUS | Status: DC
  Filled 2021-07-15: qty 1000

## 2021-07-15 MED ORDER — HYDROMORPHONE HCL 1 MG/ML IJ SOLN
1.0000 mg | INTRAMUSCULAR | Status: DC | PRN
  Administered 2021-07-16: 1 mg via INTRAVENOUS
  Filled 2021-07-15: qty 1

## 2021-07-15 NOTE — Progress Notes (Signed)
1150  Patient has a Temp of 101.3, with elevated HR of 119 and BP 124/106 Red MEWS, Md notified, Ibuprofen and Hydralazin ordered by MD.

## 2021-07-15 NOTE — Progress Notes (Signed)
Progress Note   Patient: Cameron Santiago Cameron Santiago DOB: 1995/07/17 DOA: 07/10/2021     4 DOS: the patient was seen and examined on 07/15/2021   Brief hospital course: Cameron Santiago is a 26 y.o. male with medical history significant of alcohol abuse, alcoholic pancreatitis, GERD, who presented to the ED on 07/10/2021 with progressively worsening severe abdominal pain, nausea and vomiting.    He was admitted to the hospital for recurrence of acute alcoholic pancreatitis.  Started on IV fluids, antiemetics and analgesics.  CIWA protocol for management of alcohol withdrawal.   Assessment and Plan: * Acute recurrent pancreatitis Presented with abdominal pain, nausea and vomiting.  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  Lipase 352.  Triglycerides 266.  CT abdomen pelvis showed marked edema adjacent to body and tail of pancreas consistent with acute pancreatitis, no evidence of focal pancreatic necrosis, no loculated fluid collections in or around the pancreas.  5/21: Tolerating clear liquids fairly well, ongoing epigastric pain no nausea vomiting 5/22: Tolerating full liquids but still has significantly increased epigastric pain after any p.o. intake including water.  No further nausea vomiting. Lipase declining. 5/23: Patient agreeable to advance diet, apparently has not tolerated well at all  -- Diet advanced to low-fat --Continue IV fluids -- As needed oxycodone --IV Dilaudid for breakthrough pain only --Monitor abdominal exam closely --If worsening abdominal exam or pain unrelated to p.o. intake, repeat CT abdomen   Alcohol dependence with withdrawal (HCC) Patient counseled regarding the importance of cessation of alcohol and will have ongoing risk of recurrent bouts of pancreatitis that tend to get progressively worse over time. -- Reduce Librium 25 mg TID>>BID -- CIWA protocol Q4H with PRN Ativan -- Monitor closely  5/23: Appears withdrawal  symptoms nearly resolved, remains hypertensive which might be related to abdominal pain.  No longer tachycardic.  CIWA scores recently 0  Hypomagnesemia Resolved after replacement on 5/24 Mg 1.3.  Monitor replace as needed.  Mg: 1.7  Hypokalemia K 3.5, resolved with extra replacement. Remains on NS+20 KCl fluids. Monitor BMP replace K as needed. Also monitor Mg  Leukocytosis Resolved.  Suspect reactive in the setting of acute pancreatitis, nausea vomiting.  No fevers or other evidence of infection at this time. WBC on admission 11.1 >> 14.7 >> 13.8 today. --Monitor clinically for signs and symptoms of infection --No need for antibiotics at this time   Transaminitis POA, improving.  Likely due to alcohol abuse. On admission, AST 146, ALT 84, T. bili 2.4.  Acute hepatitis panel negative --Avoid Tylenol -- Follow CMP  5/22: ALT is normalized, AST improving, 47.  T. bili continues to improve 1.7  Elevated blood pressure reading Bp 164/103.  Likely elevated due to abdominal pain.  No prior history of chronic hypertension.  5/23: BP's remain elevated, presumed due to pain, but will start scheduled med  --Started amlodipine 5 mg daily --As needed IV hydralazine  GERD (gastroesophageal reflux disease) -- Continue Pepcid  Hypophosphatemia Resolved with replacement on 5/21 for phos 1.4, given IV K-Phos.   Monitor and further replace as needed.   Constipation Senna-S BID for now. Continue hydration. Mobilize as tolerated.        Subjective: Patient was awake resting bed when seen on rounds.  He reports ongoing epigastric pain but agreeable to try advancing diet and see how he does.  He is hoping to go home soon.  This afternoon, bedside RN reports he is not really tolerating food and has  a lot of increased pain after trying to eat.  No other acute events reported no acute complaints.  Physical Exam: Vitals:   07/14/21 2019 07/15/21 0550 07/15/21 0733 07/15/21 1026  BP:  (!) 159/99 (!) 162/108 (!) 173/115 (!) 146/73  Pulse: 100 87 86 81  Resp: 18 20 20    Temp: 99.1 F (37.3 C) 98.2 F (36.8 C) 99.1 F (37.3 C)   TempSrc: Oral Oral Oral   SpO2: 93% 96% 96%   Weight:      Height:       General exam: awake, alert, no acute distress HEENT: moist mucus membranes, hearing grossly normal  Respiratory system: On room air, normal respiratory effort. Cardiovascular system: Regular rhythm, no peripheral edema.   Gastrointestinal system: Distended, still with epigastric tenderness no guarding rebound, positive bowel sounds. Central nervous system: A&O x3. no gross focal neurologic deficits, normal speech Extremities: Tremors appear resolved, no edema, normal tone Skin: dry, intact, no rashes seen Psychiatry: normal mood, congruent affect, judgement and insight appear normal   Data Reviewed:  Notable labs: Glucose 107, BUN less than 5, creatinine 0.50, calcium 8.8 with albumin 3.4, AST 43  Family Communication: None  Disposition: Status is: Inpatient Remains inpatient appropriate because: Ongoing abdominal pain, not tolerating advancement of diet, still requiring IV fluid and IV medications   Planned Discharge Destination: Home    Time spent: 35 minutes  Author: , DO 07/15/2021 2:57 PM  For on call review [email or web address].

## 2021-07-15 NOTE — Clinical Social Work Note (Signed)
  Transition of Care [facility]) Screening Note   Patient Details  Name: Cameron Santiago Date of Birth: [birth year]   Transition of Care [facility]) CM/SW Contact:    Maree Krabbe, LCSW Phone [phone] 07/15/2021, 1:45 PM    Transition of Care Department [facility]) has reviewed patient and no TOC needs have been identified at this time. We will continue to monitor patient advancement through interdisciplinary progression rounds. If new patient transition needs arise, please place a TOC consult.

## 2021-07-16 LAB — COMPREHENSIVE METABOLIC PANEL
ALT: 35 U/L (ref 0–44)
AST: 47 U/L — ABNORMAL HIGH (ref 15–41)
Albumin: 3.5 g/dL (ref 3.5–5.0)
Alkaline Phosphatase: 84 U/L (ref 38–126)
Anion gap: 11 (ref 5–15)
BUN: <5 mg/dL — ABNORMAL LOW (ref 6–20)
CO2: 29 mmol/L (ref 22–32)
Calcium: 9.5 mg/dL (ref 8.9–10.3)
Chloride: 97 mmol/L — ABNORMAL LOW (ref 98–111)
Creatinine, Ser: 0.48 mg/dL — ABNORMAL LOW (ref 0.61–1.24)
GFR, Estimated: >60 mL/min (ref >60)
Glucose, Bld: 104 mg/dL — ABNORMAL HIGH (ref 70–99)
Potassium: 3.6 mmol/L (ref 3.5–5.1)
Sodium: 137 mmol/L (ref 135–145)
Total Bilirubin: 1.2 mg/dL (ref 0.3–1.2)
Total Protein: 8.6 g/dL — ABNORMAL HIGH (ref 6.5–8.1)

## 2021-07-16 LAB — GLUCOSE, CAPILLARY: Glucose-Capillary: 95 mg/dL (ref 70–99)

## 2021-07-16 LAB — MAGNESIUM: Magnesium: 1.8 mg/dL (ref 1.7–2.4)

## 2021-07-16 MED ORDER — AMLODIPINE BESYLATE 5 MG PO TABS: 5.0000 mg | ORAL_TABLET | Freq: Every day | ORAL | 0 refills | Status: DC

## 2021-07-16 NOTE — TOC Initial Note (Signed)
Transition of Care [facility]) - Initial/Assessment Note    Patient Details  Name: Cameron Santiago MRN: [id number] Date of Birth: 14-Aug-1995  Transition of Care [facility]) CM/SW Contact:    Caryn Section, RN Phone Number: 07/16/2021, 10:33 AM  Clinical Narrative:  Patient states he lives at home, no concerns regarding medication or transportation.  Patient does not have a PCP, [facility] referral made, new patient packet given to patient.  Patient states he intends to follow up with [facility] and is able to complete paperwork.  Patient states he has no futher needs from [facility]                 Expected Discharge Plan: Home/Self Care Barriers to Discharge: Barriers Resolved   Patient Goals and CMS Choice        Expected Discharge Plan and Services Expected Discharge Plan: Home/Self Care In-house Referral: PCP / [facility] Discharge Planning Services: CM Consult   Living arrangements for the past 2 months: Single Family Home Expected Discharge Date: 07/16/21                                    Prior Living Arrangements/Services Living arrangements for the past 2 months: Single Family Home Lives with:: Self Patient language and need for interpreter reviewed:: Yes (no interpreter requied) Do you feel safe going back to the place where you live?: Yes      Need for Family Participation in Patient Care: Yes (Comment)     Criminal Activity/Legal Involvement Pertinent to Current Situation/Hospitalization: No - Comment as needed  Activities of Daily Living Home Assistive Devices/Equipment: None ADL Screening (condition at time of admission) Patient's cognitive ability adequate to safely complete daily activities?: Yes Is the patient deaf or have difficulty hearing?: No Does the patient have difficulty seeing, even when wearing glasses/contacts?: No Does the patient have difficulty concentrating, remembering, or making decisions?: No Patient able to express need for assistance  with ADLs?: Yes Does the patient have difficulty dressing or bathing?: No Independently performs ADLs?: Yes (appropriate for developmental age) Does the patient have difficulty walking or climbing stairs?: No Weakness of Legs: None Weakness of Arms/Hands: None  Permission Sought/Granted Permission sought to share information with : Case Manager Permission granted to share information with : Yes, Verbal Permission Granted              Emotional Assessment Appearance:: Appears stated age Attitude/Demeanor/Rapport: Gracious, Engaged Affect (typically observed): Pleasant, Appropriate Orientation: : Oriented to Self, Oriented to Place, Oriented to  Time, Oriented to Situation Alcohol / Substance Use: Not Applicable Psych Involvement: No (comment)  Admission diagnosis:  Acute recurrent pancreatitis [K85.90] Alcohol-induced acute pancreatitis, unspecified complication status [K85.20] Patient Active Problem List   Diagnosis Date Noted   Hypophosphatemia 07/13/2021   Hypomagnesemia 07/12/2021   Constipation 07/12/2021   Leukocytosis 07/11/2021   Hypokalemia 07/11/2021   Acute recurrent pancreatitis 07/10/2021   Transaminitis 07/10/2021   Alcohol dependence with withdrawal (HCC) 07/10/2021   GERD (gastroesophageal reflux disease) 07/10/2021   Elevated blood pressure reading 07/10/2021   Alcoholic pancreatitis 11/19/2020   Acute appendicitis 02/14/2015   PCP:  [facility]:   [id number] Cl[facility] - [place] [place] - [address] [place] [address] Phone: [phone] Fax: [phone]  [id number] [place], [place] - [address] AT [facility]  [address] [place] [address] Phone: [phone] Fax: [phone]     Social Determinants of Health (SDOH) Interventions    Readmission Risk Interventions     View : No data to display.

## 2021-07-16 NOTE — TOC Progression Note (Signed)
Transition of Care [facility]) - Progression Note    Patient Details  Name: Cameron Santiago MRN: [id number] Date of Birth: 11/15/1995  Transition of Care [facility]) CM/SW Contact  Caryn Section, RN Phone Number: 07/16/2021, 10:26 AM  Clinical Narrative:   Patient in need of PCP referral [facility], information for patient provided         Expected Discharge Plan and Services           Expected Discharge Date: 07/16/21                                     Social Determinants of Health (SDOH) Interventions    Readmission Risk Interventions     View : No data to display.

## 2021-07-16 NOTE — Discharge Summary (Signed)
Physician Discharge Summary  Cameron Santiago ZOX:[id number] DOB: 03-Nov-1995 DOA: 07/10/2021  [facility]P: [facility]  Admit date: 07/10/2021 Discharge date: 07/16/2021 Recommendations for Outpatient Follow-up:  Follow up with PCP in 1 weeks-call for appointment Please obtain BMP/CBC in one week  Discharge Dispo: home Discharge Condition: Stable Code Status:   Code Status: Full Code Diet recommendation:  Diet Order             Diet Heart Room service appropriate? Yes; Fluid consistency: Thin  Diet effective now                    Brief/Interim Summary: Cameron Santiago is a 26 y.o. male with medical history significant of alcohol abuse, alcoholic pancreatitis, GERD, who presented to the ED on 07/10/2021 with progressively worsening severe abdominal pain, nausea and vomiting. He was admitted to the hospital for recurrence of acute alcoholic pancreatitis.  Started on IV fluids, antiemetics and analgesics.  CIWA protocol for management of alcohol withdrawal.  Patient has been clinically improving. His CIWA score has been scoring at 0 for >48 hrs-and Librium is being tapered off.  He has been tolerating diet ambulating well.  He will schedule for discharge home today.  I discussed in length about alcohol cessation.   Discharge Diagnoses:  Principal Problem:   Acute recurrent pancreatitis Active Problems:   Alcohol dependence with withdrawal (HCC)   Transaminitis   Leukocytosis   Hypokalemia   Hypomagnesemia   Elevated blood pressure reading   GERD (gastroesophageal reflux disease)   Constipation   Hypophosphatemia  Assessment and Plan: * Acute recurrent pancreatitis Presented with abdominal pain, nausea and vomiting.  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  Lipase 352.  Triglycerides 266.  CT abdomen pelvis showed marked edema adjacent to body and tail of pancreas consistent with acute pancreatitis, no evidence of focal pancreatic  necrosis, no loculated fluid collections in or around the pancreas.  5/21: Tolerating clear liquids fairly well, ongoing epigastric pain no nausea vomiting 5/22: Tolerating full liquids but still has significantly increased epigastric pain after any p.o. intake including water.  No further nausea vomiting. Lipase declining. 5/23: Patient agreeable to advance diet, apparently has not tolerated well at all Tolerating low-fat diet, no complaints of abdominal pain, feels ready for discharge home today   Alcohol dependence with withdrawal [facility]) Patient counseled regarding the importance of cessation of alcohol and will have ongoing risk of recurrent bouts of pancreatitis that tend to get progressively worse over time. Alcohol symptoms resolved.  Discontinued Librium, CIWA score has been 0 for more than 48 hours.  Counseled alcohol cessation  Hypomagnesemia Hypokalemia Hypophosphatemia: Resolved  LeukocytosisResolved.Suspect reactive in the setting of acute pancreatitis, nausea vomiting.  No fevers or other evidence of infection at this time. WBC on admission 11.1 >> 14.7 >> 13.8  Transaminitis POA, improving.  Likely due to alcohol abuse. On admission, AST 146> 47, ALT 84 > 35, T. bili 2.4> 1.2.  Acute hepatitis panel negative PCP follow-up. Recent Labs  Lab 07/12/21 0411 07/13/21 0552 07/14/21 0324 07/15/21 0432 07/16/21 0445  AST 67* 60* 47* 43* 47*  ALT 40 36 34 32 35  ALKPHOS 80 83 81 90 84  BILITOT 3.6* 2.5* 1.7* 1.0 1.2  PROT 7.2 7.2 7.2 8.0 8.6*  ALBUMIN 3.3* 3.1* 3.1* 3.4* 3.5    5/22: ALT is normalized, AST improving, 47.  T. bili continues to improve 1.7  Elevated blood pressure reading No prior  history of hypertension, likely has underlying hypertension placed on amlodipine 5 continue to follow-up with PCP   GERD Continue Pepcid Constipation: Stool softeners  Consults: TOC Subjective: Alert awake oriented, tolerating food.  Feels ready for home.  Discharge  Exam: Vitals:   07/16/21 0631 07/16/21 0725  BP: (!) 140/96 (!) 155/102  Pulse: 85 78  Resp:  18  Temp: 98.5 F (36.9 C) 98.7 F (37.1 C)  SpO2: 97% 95%   General: Pt is alert, awake, not in acute distress Cardiovascular: RRR, S1/S2 +, no rubs, no gallops Respiratory: CTA bilaterally, no wheezing, no rhonchi Abdominal: Soft, NT, ND, bowel sounds + Extremities: no edema, no cyanosis  Discharge Instructions  Discharge Instructions     Discharge instructions   Complete by: As directed    Please call call MD or return to ER for similar or worsening recurring problem that brought you to hospital or if any fever,nausea/vomiting,abdominal pain, uncontrolled pain, chest pain,  shortness of breath or any other alarming symptoms.  Please follow-up your doctor as instructed in a week time and call the office for appointment.  Please avoid alcohol, smoking, or any other illicit substance and maintain healthy habits including taking your regular medications as prescribed.  You were cared for by a hospitalist during your hospital stay. If you have any questions about your discharge medications or the care you received while you were in the hospital after you are discharged, you can call the unit and ask to speak with the hospitalist on call if the hospitalist that took care of you is not available.  Once you are discharged, your primary care physician will handle any further medical issues. Please note that NO REFILLS for any discharge medications will be authorized once you are discharged, as it is imperative that you return to your primary care physician (or establish a relationship with a primary care physician if you do not have one) for your aftercare needs so that they can reassess your need for medications and monitor your lab values   Increase activity slowly   Complete by: As directed       Allergies as of 07/16/2021   No Known Allergies      Medication List     TAKE these  medications    albuterol 108 (90 Base) MCG/ACT inhaler Commonly known as: VENTOLIN HFA Inhale 2 puffs into the lungs every 6 (six) hours as needed for wheezing or shortness of breath.   amLODipine 5 MG tablet Commonly known as: NORVASC Take 1 tablet (5 mg total) by mouth daily. Start taking on: Jul 17, 2021   famotidine 20 MG tablet Commonly known as: PEPCID Take 1 tablet (20 mg total) by mouth 2 (two) times daily.   ondansetron 4 MG disintegrating tablet Commonly known as: ZOFRAN-ODT Take 1 tablet (4 mg total) by mouth every 8 (eight) hours as needed for nausea or vomiting.        Follow-up Information     [facility] Follow up in 1 week(s).   Specialty: General Practice Contact information: [address] [place] [address] [phone]                No Known Allergies  The results of significant diagnostics from this hospitalization (including imaging, microbiology, ancillary and laboratory) are listed below for reference.    Microbiology: No results found for this or any previous visit (from the past 240 hour(s)).  Procedures/Studies: CT ABDOMEN PELVIS W CONTRAST  Result Date: 07/10/2021 CLINICAL DATA:  Abdominal pain in the left lower quadrant EXAM: CT ABDOMEN AND PELVIS WITH CONTRAST TECHNIQUE: Multidetector CT imaging of the abdomen and pelvis was performed using the standard protocol following bolus administration of intravenous contrast. RADIATION DOSE REDUCTION: This exam was performed according to the departmental dose-optimization program which includes automated exposure control, adjustment of the mA and/or kV according to patient size and/or use of iterative reconstruction technique. CONTRAST:  OMNIPAQUE IOHEXOL 300 MG/ML  SOLN COMPARISON:  05/26/2021 FINDINGS: Lower chest: Small patchy linear densities in the posterior lower lung fields may suggest subsegmental atelectasis. Heart is enlarged in size.  There is no pleural effusion. Hepatobiliary: There is marked enlargement of liver measuring 29.6 cm in length. There is marked fatty infiltration in the liver. There is no dilation of bile ducts. Gallbladder is unremarkable. Pancreas: There is edema adjacent to body and tail of pancreas extending into the mesentery. There is no loculated fluid collection in or around the pancreas. There is homogeneous enhancement in the pancreas. There is no dilation of pancreatic duct. Spleen: Spleen measures 13.2 cm in maximum diameter. Adrenals/Urinary Tract: Adrenals are not enlarged. There is no hydronephrosis. There are no renal or ureteral stones. Urinary bladder is unremarkable. Stomach/Bowel: Stomach is not distended. Stranding in the fat planes along the greater curvature aspect of the stomach may be related to pancreatitis. There is no dilation of small-bowel loops. Appendix is not seen. There is no pericecal inflammation. There is no significant wall thickening in colon. Vascular/Lymphatic: Unremarkable. Reproductive: Unremarkable. Other: There is no ascites or pneumoperitoneum. Small left inguinal hernia containing fat is seen. Musculoskeletal: No acute findings are seen. IMPRESSION: There is marked edema adjacent to body and tail of pancreas consistent with acute pancreatitis. There is no evidence of focal pancreatic necrosis. There are no loculated fluid collections in or around the pancreas. There is no evidence of intestinal obstruction or pneumoperitoneum. There is no hydronephrosis. There is marked enlargement of liver with severe fatty infiltration. Enlarged spleen. Small linear densities in the posterior lower lung fields may suggest subsegmental atelectasis. Other findings as described in the body of the report. Electronically Signed   By: Ernie Avena M.D.   On: 07/10/2021 11:51    Labs: BNP (last 3 results) No results for input(s): BNP in the last 8760 hours. Basic Metabolic Panel: Recent Labs   Lab 07/12/21 0411 07/13/21 0552 07/14/21 0324 07/15/21 0432 07/16/21 0445  NA 136 134* 137 140 137  K 3.5 2.9* 3.2* 3.5 3.6  CL 98 97* 99 103 97*  CO2 27 28 28 26 29   GLUCOSE 107* 117* 104* 107* 104*  BUN 7 6 <5* <5* <5*  CREATININE 0.50* 0.49* 0.50* 0.50* 0.48*  CALCIUM 7.5* 7.6* 8.1* 8.8* 9.5  MG 1.3* 2.0 1.8 1.7 1.8  PHOS  --  1.4* 2.9 4.4  --    Liver Function Tests: Recent Labs  Lab 07/12/21 0411 07/13/21 0552 07/14/21 0324 07/15/21 0432 07/16/21 0445  AST 67* 60* 47* 43* 47*  ALT 40 36 34 32 35  ALKPHOS 80 83 81 90 84  BILITOT 3.6* 2.5* 1.7* 1.0 1.2  PROT 7.2 7.2 7.2 8.0 8.6*  ALBUMIN 3.3* 3.1* 3.1* 3.4* 3.5   Recent Labs  Lab 07/10/21 0917 07/14/21 0324  LIPASE 352* 112*   No results for input(s): AMMONIA in the last 168 hours. CBC: Recent Labs  Lab 07/10/21 0917 07/11/21 0347 07/12/21 0411 07/13/21 0552  WBC 11.1* 14.7* 13.8* 8.8  HGB 16.3 15.5 14.2 13.0  HCT 47.6 44.9 42.6 38.6*  MCV 96.6 98.0 99.1 98.5  PLT 291 246 176 174   Cardiac Enzymes: No results for input(s): CKTOTAL, CKMB, CKMBINDEX, TROPONINI in the last 168 hours. BNP: Invalid input(s): POCBNP CBG: Recent Labs  Lab 07/14/21 0744 07/14/21 2045 07/15/21 0741 07/15/21 1951 07/16/21 0725  GLUCAP 91 137* 98 140* 95   D-Dimer No results for input(s): DDIMER in the last 72 hours. Hgb A1c No results for input(s): HGBA1C in the last 72 hours. Lipid Profile No results for input(s): CHOL, HDL, LDLCALC, TRIG, CHOLHDL, LDLDIRECT in the last 72 hours. Thyroid function studies No results for input(s): TSH, T4TOTAL, T3FREE, THYROIDAB in the last 72 hours.  Invalid input(s): FREET3 Anemia work up No results for input(s): VITAMINB12, FOLATE, FERRITIN, TIBC, IRON, RETICCTPCT in the last 72 hours. Urinalysis    Component Value Date/Time   COLORURINE YELLOW (A) 07/10/2021 0917   APPEARANCEUR CLEAR (A) 07/10/2021 0917   LABSPEC >1.046 (H) 07/10/2021 0917   PHURINE 6.0 07/10/2021 0917    GLUCOSEU NEGATIVE 07/10/2021 0917   HGBUR NEGATIVE 07/10/2021 0917   BILIRUBINUR NEGATIVE 07/10/2021 0917   KETONESUR 20 (A) 07/10/2021 0917   PROTEINUR 100 (A) 07/10/2021 0917   NITRITE NEGATIVE 07/10/2021 0917   LEUKOCYTESUR NEGATIVE 07/10/2021 0917   Sepsis Labs Invalid input(s): PROCALCITONIN,  WBC,  LACTICIDVEN Microbiology No results found for this or any previous visit (from the past 240 hour(s)).   Time coordinating discharge: 25 minutes  SIGNED: Lanae Boastamesh Leelan Rajewski, MD  [facility] 07/16/2021, 10:00 AM  If 7PM-7AM, please contact night-coverage [email or web address]

## 2021-10-08 ENCOUNTER — Emergency Department: Payer: Self-pay

## 2021-10-08 ENCOUNTER — Emergency Department
Admission: EM | Admit: 2021-10-08 | Discharge: 2021-10-08 | Disposition: A | Discharge status: Home / Self Care | Payer: Self-pay | Attending: Emergency Medicine | Admitting: Emergency Medicine

## 2021-10-08 ENCOUNTER — Other Ambulatory Visit: Payer: Self-pay

## 2021-10-08 ENCOUNTER — Encounter: Payer: Self-pay | Admitting: Emergency Medicine

## 2021-10-08 DIAGNOSIS — R140 Abdominal distension (gaseous): Secondary | ICD-10-CM | POA: Insufficient documentation

## 2021-10-08 DIAGNOSIS — R1012 Left upper quadrant pain: Secondary | ICD-10-CM | POA: Insufficient documentation

## 2021-10-08 DIAGNOSIS — R109 Unspecified abdominal pain: Secondary | ICD-10-CM

## 2021-10-08 DIAGNOSIS — R7989 Other specified abnormal findings of blood chemistry: Secondary | ICD-10-CM | POA: Insufficient documentation

## 2021-10-08 DIAGNOSIS — R1013 Epigastric pain: Secondary | ICD-10-CM | POA: Insufficient documentation

## 2021-10-08 LAB — COMPREHENSIVE METABOLIC PANEL
ALT: 58 U/L — ABNORMAL HIGH (ref 0–44)
AST: 127 U/L — ABNORMAL HIGH (ref 15–41)
Albumin: 4.1 g/dL (ref 3.5–5.0)
Alkaline Phosphatase: 137 U/L — ABNORMAL HIGH (ref 38–126)
Anion gap: 11 (ref 5–15)
BUN: 5 mg/dL — ABNORMAL LOW (ref 6–20)
CO2: 29 mmol/L (ref 22–32)
Calcium: 8.7 mg/dL — ABNORMAL LOW (ref 8.9–10.3)
Chloride: 98 mmol/L (ref 98–111)
Creatinine, Ser: 0.57 mg/dL — ABNORMAL LOW (ref 0.61–1.24)
GFR, Estimated: >60 mL/min (ref >60)
Glucose, Bld: 119 mg/dL — ABNORMAL HIGH (ref 70–99)
Potassium: 3.5 mmol/L (ref 3.5–5.1)
Sodium: 138 mmol/L (ref 135–145)
Total Bilirubin: 2.1 mg/dL — ABNORMAL HIGH (ref 0.3–1.2)
Total Protein: 8.7 g/dL — ABNORMAL HIGH (ref 6.5–8.1)

## 2021-10-08 LAB — CBC WITH DIFFERENTIAL/PLATELET
Abs Immature Granulocytes: 0.02 10*3/uL (ref 0.00–0.07)
Basophils Absolute: 0.1 10*3/uL (ref 0.0–0.1)
Basophils Relative: 1 %
Eosinophils Absolute: 0.1 10*3/uL (ref 0.0–0.5)
Eosinophils Relative: 1 %
HCT: 41.8 % (ref 39.0–52.0)
Hemoglobin: 14.6 g/dL (ref 13.0–17.0)
Immature Granulocytes: 0 %
Lymphocytes Relative: 18 %
Lymphs Abs: 1.2 10*3/uL (ref 0.7–4.0)
MCH: 33.8 pg (ref 26.0–34.0)
MCHC: 34.9 g/dL (ref 30.0–36.0)
MCV: 96.8 fL (ref 80.0–100.0)
Monocytes Absolute: 0.6 10*3/uL (ref 0.1–1.0)
Monocytes Relative: 9 %
Neutro Abs: 4.8 10*3/uL (ref 1.7–7.7)
Neutrophils Relative %: 71 %
Platelets: 181 10*3/uL (ref 150–400)
RBC: 4.32 MIL/uL (ref 4.22–5.81)
RDW: 13.2 % (ref 11.5–15.5)
WBC: 6.8 10*3/uL (ref 4.0–10.5)
nRBC: 0 % (ref 0.0–0.2)

## 2021-10-08 LAB — LIPASE, BLOOD: Lipase: 27 U/L (ref 11–51)

## 2021-10-08 MED ORDER — SODIUM CHLORIDE 0.9 % IV BOLUS
1000.0000 mL | Freq: Once | INTRAVENOUS | Status: AC
  Administered 2021-10-08: 1000 mL via INTRAVENOUS

## 2021-10-08 MED ORDER — DICYCLOMINE HCL 10 MG PO CAPS: 10.0000 mg | ORAL_CAPSULE | Freq: Three times a day (TID) | ORAL | 0 refills | Status: DC | PRN

## 2021-10-08 MED ORDER — MORPHINE SULFATE (PF) 4 MG/ML IV SOLN
4.0000 mg | Freq: Once | INTRAVENOUS | Status: AC
Start: 2021-10-08 — End: 2021-10-08
  Administered 2021-10-08: 4 mg via INTRAVENOUS
  Filled 2021-10-08: qty 1

## 2021-10-08 MED ORDER — SUCRALFATE 1 G PO TABS: 1.0000 g | ORAL_TABLET | Freq: Four times a day (QID) | ORAL | 0 refills | Status: DC

## 2021-10-08 MED ORDER — IOHEXOL 300 MG/ML  SOLN
100.0000 mL | Freq: Once | INTRAMUSCULAR | Status: AC | PRN
  Administered 2021-10-08: 100 mL via INTRAVENOUS

## 2021-10-08 MED ORDER — ACETAMINOPHEN 325 MG PO TABS
650.0000 mg | ORAL_TABLET | Freq: Once | ORAL | Status: AC
  Administered 2021-10-08: 650 mg via ORAL
  Filled 2021-10-08: qty 2

## 2021-10-08 MED ORDER — ALUM & MAG HYDROXIDE-SIMETH 200-200-20 MG/5ML PO SUSP
30.0000 mL | Freq: Once | ORAL | Status: AC
  Administered 2021-10-08: 30 mL via ORAL
  Filled 2021-10-08: qty 30

## 2021-10-08 MED ORDER — ONDANSETRON HCL 4 MG/2ML IJ SOLN
4.0000 mg | Freq: Once | INTRAMUSCULAR | Status: AC
  Administered 2021-10-08: 4 mg via INTRAVENOUS
  Filled 2021-10-08: qty 2

## 2021-10-08 MED ORDER — FAMOTIDINE IN NACL 20-0.9 MG/50ML-% IV SOLN
20.0000 mg | Freq: Once | INTRAVENOUS | Status: AC
  Administered 2021-10-08: 20 mg via INTRAVENOUS
  Filled 2021-10-08: qty 50

## 2021-10-08 MED ORDER — LIDOCAINE VISCOUS HCL 2 % MT SOLN
15.0000 mL | Freq: Once | OROMUCOSAL | Status: AC
  Administered 2021-10-08: 15 mL via OROMUCOSAL
  Filled 2021-10-08: qty 15

## 2021-10-08 NOTE — Discharge Instructions (Addendum)
Please seek medical attention for any high fevers, chest pain, shortness of breath, change in behavior, persistent vomiting, bloody stool or any other new or concerning symptoms.  

## 2021-10-08 NOTE — ED Provider Notes (Signed)
Patient's CT without any concerning acute findings. Given reassuring blood work and CT scan I do feel patient can be safely discharged. Discussed CT finding with patient. Will give patient prescription for medication to help with abdominal pain. Discussed return precautions.    Phineas Semen, MD 10/08/21 [phone]

## 2021-10-08 NOTE — ED Notes (Signed)
E-signature pad unavailable - Pt verbalized understanding of D/C information - no additional concerns at this time.  

## 2021-10-08 NOTE — ED Triage Notes (Signed)
Patient c/o left upper abdominal pain/ under his rib cage with N/V/D x 3 days. Patient reports drinking approx a 12 pack of beer daily with last drink 2 days ago.

## 2021-10-08 NOTE — ED Provider Notes (Signed)
[facility] Provider Note    Event Date/Time   First MD Initiated Contact with Patient 10/08/21 1301     (approximate)   History   Abdominal Pain   HPI  ARNE SCHLENDER is a 26 y.o. male with alcohol use alcoholic pancreatitis, GERD, presents with 3 d domino pain on the left side which is Similar to pancreatitis in past. Etoh use daily and last used 2 days ago and denies wd sx He denies drug use, has had normal bowel movements,  no urinary symptoms, denies pain in the testicles. He has a history of appendectomy. It has been constant and unchanging, no alleviating or exacerbating symptoms.  Tried ibuprofen without relief.      Physical Exam   Triage Vital Signs: ED Triage Vitals [10/08/21 1257]  Enc Vitals Group     BP (!) 186/134     Pulse Rate 94     Resp 20     Temp 98.4 F (36.9 C)     Temp Source Oral     SpO2 96 %     Weight 230 lb (104.3 kg)     Height 5\' 11"  (1.803 m)     Head Circumference      Peak Flow      Pain Score 10     Pain Loc      Pain Edu?      Excl. in GC?     Most recent vital signs: Vitals:   10/08/21 1332 10/08/21 1548  BP: (!) 164/131 (!) 150/101  Pulse: 80 89  Resp: 19 17  Temp:    SpO2: 97% 96%     General: Awake, no distress.  CV:  Good peripheral perfusion.  Resp:  Normal effort.  There to auscultation bilaterally Abd:  Mild distention, tenderness to palpation in the epigastrium and left upper quadrant.  No rigidity or guarding.   ED Results / Procedures / Treatments   Labs (all labs ordered are listed, but only abnormal results are displayed) Labs Reviewed  COMPREHENSIVE METABOLIC PANEL - Abnormal; Notable for the following components:      Result Value   Glucose, Bld 119 (*)    BUN 5 (*)    Creatinine, Ser 0.57 (*)    Calcium 8.7 (*)    Total Protein 8.7 (*)    AST 127 (*)    ALT 58 (*)    Alkaline Phosphatase 137 (*)    Total Bilirubin 2.1 (*)    All other components within  normal limits  CBC WITH DIFFERENTIAL/PLATELET  LIPASE, BLOOD      RADIOLOGY CT of the abdomen and pelvis is pending upon signout.   PROCEDURES:  Critical Care performed: No  Procedures   MEDICATIONS ORDERED IN ED: Medications  ondansetron (ZOFRAN) injection 4 mg (4 mg Intravenous Given 10/08/21 1359)  sodium chloride 0.9 % bolus 1,000 mL (1,000 mLs Intravenous Bolus 10/08/21 1400)  morphine (PF) 4 MG/ML injection 4 mg (4 mg Intravenous Given 10/08/21 1359)  acetaminophen (TYLENOL) tablet 650 mg (650 mg Oral Given 10/08/21 1359)  famotidine (PEPCID) IVPB 20 mg premix (0 mg Intravenous Stopped 10/08/21 1536)  alum & mag hydroxide-simeth (MAALOX/MYLANTA) 200-200-20 MG/5ML suspension 30 mL (30 mLs Oral Given 10/08/21 1506)     IMPRESSION / MDM / ASSESSMENT AND PLAN / ED COURSE  I reviewed the triage vital signs and the nursing notes.  Differential diagnosis includes, but is not limited to, alcoholic pancreatitis, GERD, gastric ulcer, biliary colic.  Patient's presentation is most consistent with acute presentation with potential threat to life or bodily function.  This is a patient with alcoholic pancreatitis history who presents with symptoms consistent with prior.  Likely pancreatitis, will check labs and lipase.  Considered gastric ulcer, GERD, will treat with GI cocktail.  Patient has had multiple prior CT of the abdomen and pelvis that has showed uncomplicated pancreatitis, will defer imaging at this time and attempt treat pain and reassess.  I independently interpreted pertinent lab work including a normal lipase and mildly elevated LFTs consistent with alcohol use.  Pain ongoing, tenderness ongoing despite treatment with pain medications and antacids for GERD.  Will obtain CT of the abdomen pelvis to assess for acute intra-abdominal pathology.      FINAL CLINICAL IMPRESSION(S) / ED DIAGNOSES   Final diagnoses:  Left upper quadrant abdominal  pain     Rx / DC Orders   ED Discharge Orders     None        Note:  This document was prepared using Dragon voice recognition software and may include unintentional dictation errors.    Pilar Jarvis, MD 10/08/21 1620

## 2021-12-21 DIAGNOSIS — E876 Hypokalemia: Secondary | ICD-10-CM | POA: Diagnosis present

## 2021-12-21 DIAGNOSIS — R509 Fever, unspecified: Secondary | ICD-10-CM | POA: Diagnosis not present

## 2021-12-21 DIAGNOSIS — K766 Portal hypertension: Secondary | ICD-10-CM | POA: Diagnosis present

## 2021-12-21 DIAGNOSIS — Z20822 Contact with and (suspected) exposure to covid-19: Secondary | ICD-10-CM | POA: Diagnosis present

## 2021-12-21 DIAGNOSIS — F10239 Alcohol dependence with withdrawal, unspecified: Secondary | ICD-10-CM | POA: Diagnosis present

## 2021-12-21 DIAGNOSIS — Z79899 Other long term (current) drug therapy: Secondary | ICD-10-CM

## 2021-12-21 DIAGNOSIS — K7031 Alcoholic cirrhosis of liver with ascites: Principal | ICD-10-CM | POA: Diagnosis present

## 2021-12-21 DIAGNOSIS — R161 Splenomegaly, not elsewhere classified: Secondary | ICD-10-CM | POA: Diagnosis present

## 2021-12-21 DIAGNOSIS — E669 Obesity, unspecified: Secondary | ICD-10-CM | POA: Diagnosis present

## 2021-12-21 DIAGNOSIS — Z6837 Body mass index (BMI) 37.0-37.9, adult: Secondary | ICD-10-CM

## 2021-12-21 DIAGNOSIS — N451 Epididymitis: Secondary | ICD-10-CM | POA: Diagnosis present

## 2021-12-21 DIAGNOSIS — K760 Fatty (change of) liver, not elsewhere classified: Secondary | ICD-10-CM | POA: Diagnosis present

## 2021-12-21 DIAGNOSIS — E8770 Fluid overload, unspecified: Secondary | ICD-10-CM | POA: Diagnosis present

## 2021-12-21 DIAGNOSIS — K219 Gastro-esophageal reflux disease without esophagitis: Secondary | ICD-10-CM | POA: Diagnosis present

## 2021-12-21 DIAGNOSIS — Z9049 Acquired absence of other specified parts of digestive tract: Secondary | ICD-10-CM

## 2021-12-22 ENCOUNTER — Inpatient Hospital Stay
Admission: EM | Admit: 2021-12-22 | Discharge: 2022-01-01 | DRG: 433 | Disposition: A | Discharge status: Home / Self Care | Payer: Self-pay | Attending: Obstetrics and Gynecology | Admitting: Obstetrics and Gynecology

## 2021-12-22 ENCOUNTER — Other Ambulatory Visit: Payer: Self-pay

## 2021-12-22 ENCOUNTER — Emergency Department: Payer: Self-pay

## 2021-12-22 DIAGNOSIS — F1010 Alcohol abuse, uncomplicated: Secondary | ICD-10-CM

## 2021-12-22 DIAGNOSIS — K7030 Alcoholic cirrhosis of liver without ascites: Secondary | ICD-10-CM | POA: Diagnosis present

## 2021-12-22 DIAGNOSIS — N451 Epididymitis: Secondary | ICD-10-CM | POA: Diagnosis present

## 2021-12-22 DIAGNOSIS — F10239 Alcohol dependence with withdrawal, unspecified: Secondary | ICD-10-CM | POA: Diagnosis present

## 2021-12-22 DIAGNOSIS — K219 Gastro-esophageal reflux disease without esophagitis: Secondary | ICD-10-CM | POA: Diagnosis present

## 2021-12-22 DIAGNOSIS — R1084 Generalized abdominal pain: Principal | ICD-10-CM

## 2021-12-22 DIAGNOSIS — E669 Obesity, unspecified: Secondary | ICD-10-CM | POA: Diagnosis present

## 2021-12-22 DIAGNOSIS — R7989 Other specified abnormal findings of blood chemistry: Secondary | ICD-10-CM

## 2021-12-22 DIAGNOSIS — R17 Unspecified jaundice: Secondary | ICD-10-CM

## 2021-12-22 DIAGNOSIS — K7031 Alcoholic cirrhosis of liver with ascites: Secondary | ICD-10-CM | POA: Diagnosis present

## 2021-12-22 DIAGNOSIS — R112 Nausea with vomiting, unspecified: Secondary | ICD-10-CM

## 2021-12-22 DIAGNOSIS — R188 Other ascites: Secondary | ICD-10-CM

## 2021-12-22 DIAGNOSIS — E876 Hypokalemia: Secondary | ICD-10-CM

## 2021-12-22 LAB — COMPREHENSIVE METABOLIC PANEL
ALT: 24 U/L (ref 0–44)
AST: 150 U/L — ABNORMAL HIGH (ref 15–41)
Albumin: 3 g/dL — ABNORMAL LOW (ref 3.5–5.0)
Alkaline Phosphatase: 181 U/L — ABNORMAL HIGH (ref 38–126)
Anion gap: 14 (ref 5–15)
BUN: <5 mg/dL — ABNORMAL LOW (ref 6–20)
CO2: 27 mmol/L (ref 22–32)
Calcium: 7.8 mg/dL — ABNORMAL LOW (ref 8.9–10.3)
Chloride: 93 mmol/L — ABNORMAL LOW (ref 98–111)
Creatinine, Ser: 0.4 mg/dL — ABNORMAL LOW (ref 0.61–1.24)
GFR, Estimated: >60 mL/min (ref >60)
Glucose, Bld: 158 mg/dL — ABNORMAL HIGH (ref 70–99)
Potassium: 2.6 mmol/L — CL (ref 3.5–5.1)
Sodium: 134 mmol/L — ABNORMAL LOW (ref 135–145)
Total Bilirubin: 6.4 mg/dL — ABNORMAL HIGH (ref 0.3–1.2)
Total Protein: 8.8 g/dL — ABNORMAL HIGH (ref 6.5–8.1)

## 2021-12-22 LAB — URINALYSIS, ROUTINE W REFLEX MICROSCOPIC
Bacteria, UA: NONE SEEN
Bilirubin Urine: NEGATIVE
Glucose, UA: NEGATIVE mg/dL
Ketones, ur: NEGATIVE mg/dL
Leukocytes,Ua: NEGATIVE
Nitrite: NEGATIVE
Protein, ur: NEGATIVE mg/dL
Specific Gravity, Urine: 1.003 — ABNORMAL LOW (ref 1.005–1.030)
Squamous Epithelial / HPF: NONE SEEN (ref 0–5)
pH: 6 (ref 5.0–8.0)

## 2021-12-22 LAB — CBC
HCT: 36.4 % — ABNORMAL LOW (ref 39.0–52.0)
Hemoglobin: 12.9 g/dL — ABNORMAL LOW (ref 13.0–17.0)
MCH: 34.7 pg — ABNORMAL HIGH (ref 26.0–34.0)
MCHC: 35.4 g/dL (ref 30.0–36.0)
MCV: 97.8 fL (ref 80.0–100.0)
Platelets: 191 10*3/uL (ref 150–400)
RBC: 3.72 MIL/uL — ABNORMAL LOW (ref 4.22–5.81)
RDW: 18.3 % — ABNORMAL HIGH (ref 11.5–15.5)
WBC: 12 10*3/uL — ABNORMAL HIGH (ref 4.0–10.5)
nRBC: 0.2 % (ref 0.0–0.2)

## 2021-12-22 LAB — MAGNESIUM: Magnesium: 1.4 mg/dL — ABNORMAL LOW (ref 1.7–2.4)

## 2021-12-22 LAB — LIPASE, BLOOD: Lipase: 46 U/L (ref 11–51)

## 2021-12-22 LAB — CHLAMYDIA/NGC RT PCR (ARMC ONLY)
Chlamydia Tr: NOT DETECTED
N gonorrhoeae: NOT DETECTED

## 2021-12-22 MED ORDER — ALBUTEROL SULFATE (2.5 MG/3ML) 0.083% IN NEBU: 2.5000 mg | INHALATION_SOLUTION | Freq: Four times a day (QID) | RESPIRATORY_TRACT | Status: DC | PRN

## 2021-12-22 MED ORDER — ONDANSETRON HCL 4 MG PO TABS
4.0000 mg | ORAL_TABLET | Freq: Four times a day (QID) | ORAL | Status: DC | PRN
  Administered 2021-12-23: 4 mg via ORAL
  Filled 2021-12-22: qty 1

## 2021-12-22 MED ORDER — LORAZEPAM 2 MG PO TABS
0.0000 mg | ORAL_TABLET | Freq: Four times a day (QID) | ORAL | Status: AC
  Administered 2021-12-22 – 2021-12-23 (×3): 1 mg via ORAL
  Filled 2021-12-22 (×3): qty 1

## 2021-12-22 MED ORDER — THIAMINE MONONITRATE 100 MG PO TABS
100.0000 mg | ORAL_TABLET | Freq: Every day | ORAL | Status: DC
  Administered 2021-12-23 – 2022-01-01 (×9): 100 mg via ORAL
  Filled 2021-12-22 (×10): qty 1

## 2021-12-22 MED ORDER — NALOXONE HCL 0.4 MG/ML IJ SOLN: 0.4000 mg | INTRAMUSCULAR | Status: DC | PRN

## 2021-12-22 MED ORDER — MORPHINE SULFATE (PF) 2 MG/ML IV SOLN
2.0000 mg | INTRAVENOUS | Status: DC | PRN
  Administered 2021-12-22 – 2021-12-31 (×35): 2 mg via INTRAVENOUS
  Filled 2021-12-22 (×36): qty 1

## 2021-12-22 MED ORDER — MORPHINE SULFATE (PF) 4 MG/ML IV SOLN
4.0000 mg | Freq: Once | INTRAVENOUS | Status: AC
  Administered 2021-12-22: 4 mg via INTRAVENOUS
  Filled 2021-12-22: qty 1

## 2021-12-22 MED ORDER — SODIUM CHLORIDE 0.9 % IV SOLN: INTRAVENOUS | Status: DC

## 2021-12-22 MED ORDER — POTASSIUM CHLORIDE CRYS ER 20 MEQ PO TBCR
40.0000 meq | EXTENDED_RELEASE_TABLET | Freq: Once | ORAL | Status: AC
  Administered 2021-12-22: 40 meq via ORAL
  Filled 2021-12-22: qty 2

## 2021-12-22 MED ORDER — POTASSIUM CHLORIDE 10 MEQ/100ML IV SOLN
10.0000 meq | Freq: Once | INTRAVENOUS | Status: AC
  Administered 2021-12-22: 10 meq via INTRAVENOUS
  Filled 2021-12-22: qty 100

## 2021-12-22 MED ORDER — LORAZEPAM 2 MG/ML IJ SOLN
1.0000 mg | Freq: Once | INTRAMUSCULAR | Status: AC
Start: 2021-12-22 — End: 2021-12-22
  Administered 2021-12-22: 1 mg via INTRAVENOUS
  Filled 2021-12-22: qty 1

## 2021-12-22 MED ORDER — FOLIC ACID 1 MG PO TABS
1.0000 mg | ORAL_TABLET | Freq: Every day | ORAL | Status: DC
  Administered 2021-12-22 – 2022-01-01 (×11): 1 mg via ORAL
  Filled 2021-12-22 (×11): qty 1

## 2021-12-22 MED ORDER — ADULT MULTIVITAMIN W/MINERALS CH
1.0000 | ORAL_TABLET | Freq: Every day | ORAL | Status: DC
  Administered 2021-12-22 – 2022-01-01 (×11): 1 via ORAL
  Filled 2021-12-22 (×11): qty 1

## 2021-12-22 MED ORDER — LORAZEPAM 2 MG PO TABS
0.0000 mg | ORAL_TABLET | Freq: Two times a day (BID) | ORAL | Status: AC
  Administered 2021-12-24: 2 mg via ORAL
  Administered 2021-12-24: 1 mg via ORAL
  Administered 2021-12-25: 2 mg via ORAL
  Filled 2021-12-22 (×4): qty 1

## 2021-12-22 MED ORDER — SODIUM CHLORIDE 0.9 % IV SOLN
100.0000 mg | Freq: Two times a day (BID) | INTRAVENOUS | Status: DC
  Administered 2021-12-23 – 2021-12-24 (×5): 100 mg via INTRAVENOUS
  Filled 2021-12-22 (×7): qty 100

## 2021-12-22 MED ORDER — THIAMINE HCL 100 MG/ML IJ SOLN
100.0000 mg | Freq: Every day | INTRAMUSCULAR | Status: DC
  Administered 2021-12-26: 100 mg via INTRAVENOUS
  Filled 2021-12-22 (×5): qty 2

## 2021-12-22 MED ORDER — LEVOFLOXACIN IN D5W 500 MG/100ML IV SOLN: 500.0000 mg | Freq: Once | INTRAVENOUS | Status: DC

## 2021-12-22 MED ORDER — ACETAMINOPHEN 325 MG PO TABS
325.0000 mg | ORAL_TABLET | Freq: Once | ORAL | Status: AC
  Administered 2021-12-22: 325 mg via ORAL
  Filled 2021-12-22: qty 1

## 2021-12-22 MED ORDER — ONDANSETRON HCL 4 MG/2ML IJ SOLN
4.0000 mg | Freq: Four times a day (QID) | INTRAMUSCULAR | Status: DC | PRN
  Administered 2021-12-22 – 2021-12-25 (×3): 4 mg via INTRAVENOUS
  Filled 2021-12-22 (×3): qty 2

## 2021-12-22 MED ORDER — LORAZEPAM 2 MG/ML IJ SOLN: 1.0000 mg | Freq: Once | INTRAMUSCULAR | Status: DC

## 2021-12-22 MED ORDER — ONDANSETRON HCL 4 MG/2ML IJ SOLN
4.0000 mg | Freq: Once | INTRAMUSCULAR | Status: AC
  Administered 2021-12-22: 4 mg via INTRAVENOUS
  Filled 2021-12-22: qty 2

## 2021-12-22 MED ORDER — SODIUM CHLORIDE 0.9 % IV SOLN
2.0000 g | Freq: Once | INTRAVENOUS | Status: AC
  Administered 2021-12-22: 2 g via INTRAVENOUS
  Filled 2021-12-22: qty 20

## 2021-12-22 MED ORDER — THIAMINE HCL 100 MG/ML IJ SOLN
100.0000 mg | Freq: Once | INTRAMUSCULAR | Status: AC
  Administered 2021-12-22: 100 mg via INTRAVENOUS
  Filled 2021-12-22: qty 2

## 2021-12-22 MED ORDER — MAGNESIUM SULFATE 2 GM/50ML IV SOLN
2.0000 g | Freq: Once | INTRAVENOUS | Status: AC
  Administered 2021-12-22: 2 g via INTRAVENOUS
  Filled 2021-12-22: qty 50

## 2021-12-22 MED ORDER — POTASSIUM CHLORIDE CRYS ER 20 MEQ PO TBCR
40.0000 meq | EXTENDED_RELEASE_TABLET | Freq: Two times a day (BID) | ORAL | Status: AC
Start: 2021-12-22 — End: 2021-12-22
  Administered 2021-12-22 (×2): 40 meq via ORAL
  Filled 2021-12-22 (×2): qty 2

## 2021-12-22 MED ORDER — SODIUM CHLORIDE 0.9 % IV SOLN
100.0000 mg | Freq: Once | INTRAVENOUS | Status: AC
  Administered 2021-12-22: 100 mg via INTRAVENOUS
  Filled 2021-12-22: qty 100

## 2021-12-22 MED ORDER — LORAZEPAM 2 MG/ML IJ SOLN
1.0000 mg | INTRAMUSCULAR | Status: AC | PRN
  Administered 2021-12-22: 2 mg via INTRAVENOUS
  Administered 2021-12-22: 1 mg via INTRAVENOUS
  Administered 2021-12-23 – 2021-12-24 (×3): 2 mg via INTRAVENOUS
  Filled 2021-12-22 (×5): qty 1

## 2021-12-22 MED ORDER — SODIUM CHLORIDE 0.9 % IV BOLUS
1000.0000 mL | Freq: Once | INTRAVENOUS | Status: AC
  Administered 2021-12-22: 1000 mL via INTRAVENOUS

## 2021-12-22 MED ORDER — IOHEXOL 300 MG/ML  SOLN
100.0000 mL | Freq: Once | INTRAMUSCULAR | Status: AC | PRN
  Administered 2021-12-22: 100 mL via INTRAVENOUS

## 2021-12-22 MED ORDER — LORAZEPAM 1 MG PO TABS
1.0000 mg | ORAL_TABLET | ORAL | Status: AC | PRN
  Administered 2021-12-23 (×2): 1 mg via ORAL
  Administered 2021-12-24: 2 mg via ORAL
  Filled 2021-12-22 (×2): qty 1

## 2021-12-22 MED ORDER — KETOROLAC TROMETHAMINE 30 MG/ML IJ SOLN
15.0000 mg | Freq: Once | INTRAMUSCULAR | Status: AC
  Administered 2021-12-22: 15 mg via INTRAVENOUS
  Filled 2021-12-22: qty 1

## 2021-12-22 MED ORDER — ENOXAPARIN SODIUM 60 MG/0.6ML IJ SOSY
0.5000 mg/kg | PREFILLED_SYRINGE | INTRAMUSCULAR | Status: DC
  Administered 2021-12-22 – 2021-12-26 (×5): 57.5 mg via SUBCUTANEOUS
  Filled 2021-12-22 (×5): qty 0.6

## 2021-12-22 MED ORDER — PANTOPRAZOLE SODIUM 40 MG IV SOLR
40.0000 mg | INTRAVENOUS | Status: DC
  Administered 2021-12-22 – 2021-12-24 (×3): 40 mg via INTRAVENOUS
  Filled 2021-12-22 (×3): qty 10

## 2021-12-22 MED ORDER — SODIUM CHLORIDE 0.9 % IV SOLN
2.0000 g | Freq: Once | INTRAVENOUS | Status: AC
  Administered 2021-12-23: 2 g via INTRAVENOUS
  Filled 2021-12-22 (×2): qty 20

## 2021-12-22 NOTE — Assessment & Plan Note (Signed)
Place patient on IV PPI 

## 2021-12-22 NOTE — ED Notes (Signed)
Sleeping, arousable to voice, c/o pain, placed on [place] while sleeping d/t intermittent low SPO2, updated.

## 2021-12-22 NOTE — Consult Note (Signed)
Urology Consult  I have been asked to see the patient by Dr. Joylene Igo, for evaluation and management of left epididymitis.  Chief Complaint: Left testicular pain, scrotal swelling, abdominal pain, nausea, vomiting.  History of Present Illness: Cameron Santiago is a 26 y.o. year old male with ETOH abuse and alcoholic cirrhosis with ascites being admitted with intractable nausea/vomiting and for pain control associated with acute left epididymitis.  He presented to the ED overnight with 3 days of left testicular pain, abdominal pain, nausea, and vomiting. He states he is not sexually active.  Admission labs notable for WBC count 12.0; UA with no nitrites, hematuria, pyuria, or bacteriuria; chlamydia and gonorrhea negative. Scrotal ultrasound with heterogeneous, hypervascular left epididymis consistent with epididymitis. No evidence of torsion or subcutaneous gas. CT AP with contrast  He has been started on antibiotics as below.  Anti-infectives (From admission, onward)    Start     Dose/Rate Route Frequency Ordered Stop   12/23/21 0000  cefTRIAXone (ROCEPHIN) 2 g in sodium chloride 0.9 % 100 mL IVPB        2 g 200 mL/hr over 30 Minutes Intravenous  Once 12/22/21 1031     12/22/21 2300  doxycycline (VIBRAMYCIN) 100 mg in sodium chloride 0.9 % 250 mL IVPB        100 mg 125 mL/hr over 120 Minutes Intravenous Every 12 hours 12/22/21 1031     12/22/21 1000  doxycycline (VIBRAMYCIN) 100 mg in sodium chloride 0.9 % 250 mL IVPB        100 mg 125 mL/hr over 120 Minutes Intravenous  Once 12/22/21 0949     12/22/21 0930  levofloxacin (LEVAQUIN) IVPB 500 mg  Status:  Discontinued        500 mg 100 mL/hr over 60 Minutes Intravenous  Once 12/22/21 0924 12/22/21 0949   12/22/21 0730  cefTRIAXone (ROCEPHIN) 2 g in sodium chloride 0.9 % 100 mL IVPB        2 g 200 mL/hr over 30 Minutes Intravenous  Once 12/22/21 0724 12/22/21 9470        Past Medical History:  Diagnosis Date   Alcohol abuse     Alcoholic pancreatitis    GERD (gastroesophageal reflux disease)     Past Surgical History:  Procedure Laterality Date   APPENDECTOMY     LAPAROSCOPIC APPENDECTOMY N/A 02/15/2015   Procedure: APPENDECTOMY LAPAROSCOPIC;  Surgeon: Natale Lay, MD;  Location: [facility] ORS;  Service: General;  Laterality: N/A;   TONSILLECTOMY      Home Medications:  No outpatient medications have been marked as taking for the 12/22/21 encounter [facility] Encounter).    Allergies: No Known Allergies  Family History  Problem Relation Age of Onset   Arthritis Mother     Social History:  reports that he has never smoked. He has never used smokeless tobacco. He reports current alcohol use. He reports that he does not use drugs.  ROS: A complete review of systems was performed.  All systems are negative except for pertinent findings as noted.  Physical Exam:  Vital signs in last 24 hours: Temp:  [98.2 F (36.8 C)-98.7 F (37.1 C)] 98.2 F (36.8 C) (10/30 0750) Pulse Rate:  [101-114] 101 (10/30 1055) Resp:  [17-28] 23 (10/30 1055) BP: (127-161)/(63-98) 130/67 (10/30 1035) SpO2:  [90 %-98 %] 96 % (10/30 1055) FiO2 (%):  [100 %] 100 % (10/30 0750) Weight:  [113.4 kg] 113.4 kg (10/29 2349) Constitutional:  Alert and oriented, no acute distress HEENT:  [place] AT, moist mucus membranes Cardiovascular: No clubbing, cyanosis, or edema Respiratory: Normal respiratory effort GU: Bilateral descended testicles. Enlarged and tender left epididymitis. No scrotal edema or testicular enlargement. No scrotal erythema, fluctuance, purulence, or crepitus. Skin: No rashes, bruises or suspicious lesions Neurologic: Grossly intact, no focal deficits, moving all 4 extremities Psychiatric: Normal mood and affect  Laboratory Data:  Recent Labs    12/21/21 2353  WBC 12.0*  HGB 12.9*  HCT 36.4*   Recent Labs    12/21/21 2353  NA 134*  K 2.6*  CL 93*  CO2 27  GLUCOSE 158*  BUN <5*  CREATININE 0.40*  CALCIUM 7.8*    Urinalysis    Component Value Date/Time   COLORURINE AMBER (A) 12/21/2021 2353   APPEARANCEUR CLEAR (A) 12/21/2021 2353   LABSPEC 1.003 (L) 12/21/2021 2353   PHURINE 6.0 12/21/2021 2353   GLUCOSEU NEGATIVE 12/21/2021 2353   HGBUR SMALL (A) 12/21/2021 2353   BILIRUBINUR NEGATIVE 12/21/2021 2353   KETONESUR NEGATIVE 12/21/2021 2353   PROTEINUR NEGATIVE 12/21/2021 2353   NITRITE NEGATIVE 12/21/2021 2353   LEUKOCYTESUR NEGATIVE 12/21/2021 2353   Results for orders placed or performed during the hospital encounter of 12/22/21  Chlamydia/NGC rt PCR ([facility] only)     Status: None   Collection Time: 12/21/21 11:53 PM   Specimen: Urine  Result Value Ref Range Status   Specimen source GC/Chlam CHLAMYDIA SPECIES  Final   Chlamydia Tr NOT DETECTED NOT DETECTED Final   N gonorrhoeae NOT DETECTED NOT DETECTED Final    Comment: (NOTE) This CT/NG assay has not been evaluated in patients with a history of  hysterectomy. Performed at [facility], [address] [place], [place] [address]     Radiologic Imaging: CT ABDOMEN PELVIS W CONTRAST  Result Date: 12/22/2021 CLINICAL DATA:  Nausea and vomiting.  Abdominal pain. EXAM: CT ABDOMEN AND PELVIS WITH CONTRAST TECHNIQUE: Multidetector CT imaging of the abdomen and pelvis was performed using the standard protocol following bolus administration of intravenous contrast. RADIATION DOSE REDUCTION: This exam was performed according to the departmental dose-optimization program which includes automated exposure control, adjustment of the mA and/or kV according to patient size and/or use of iterative reconstruction technique. CONTRAST:  OMNIPAQUE IOHEXOL 300 MG/ML  SOLN COMPARISON:  10/08/2021 FINDINGS: Lower chest: No acute abnormality. Hepatobiliary: Marked diffuse hepatic steatosis. The liver is enlarged measuring 30 point 1 cm along the midclavicular line. More focal areas of low-attenuation are identified within segment 4 and segment  5 centrally which are more conspicuous when compared with the previous exam, image 41/2 and image 48/2. Gallbladder is unremarkable. No signs of bile duct dilatation. Pancreas: Unremarkable. No pancreatic ductal dilatation or surrounding inflammatory changes. Spleen: Spleen is enlarged measuring 15.7 cm. No focal splenic abnormality. Adrenals/Urinary Tract: Normal adrenal glands. Kidneys are unremarkable. Urinary bladder appears normal. Stomach/Bowel: Stomach appears within normal limits. The appendix is surgically absent. No bowel wall thickening, inflammation, or distension. Vascular/Lymphatic: Recanalization of the umbilical vein. Multiple small varicosities identified within the left hemiabdomen. Portal vein, portal venous confluence, splenic vein and SMV remain patent. No signs of abdominopelvic adenopathy. Reproductive: Prostate is unremarkable. Other: There is abdominopelvic ascites scratch set small volume of perihepatic ascites is noted. Ascites extends along both pericolic gutters into the pelvis. This is new when compared with the previous exam. Musculoskeletal: No acute or significant osseous findings. IMPRESSION: 1. Morphologic features of the liver concerning for cirrhosis, including: Marked hepatomegaly, irregular liver contour and severe hepatic steatosis. 2.  Stigmata of portal venous hypertension noted including varices, splenomegaly and ascites. Ascites is new when compared with the previous exam. 3. There are several central low-attenuation areas within the liver which are more conspicuous when compared with 10/08/2021. Favor areas of focal fatty deposition. Consider more definitive characterization with nonemergent liver protocol MRI. Electronically Signed   By: Kerby Moors M.D.   On: 12/22/2021 08:05   US SCROTUM W/DOPPLER  Result Date: 12/22/2021 CLINICAL DATA:  Left testicular pain EXAM: SCROTAL ULTRASOUND DOPPLER ULTRASOUND OF THE TESTICLES TECHNIQUE: Complete ultrasound examination  of the testicles, epididymis, and other scrotal structures was performed. Color and spectral Doppler ultrasound were also utilized to evaluate blood flow to the testicles. COMPARISON:  None Available. FINDINGS: Right testicle Measurements: 4.9 x 2.2 x 3.3 cm. Normal parenchymal echogenicity and echotexture. Normal color flow vascularity. No mass or microlithiasis visualized. Left testicle Measurements: 4.3 x 2.5 x 2.8 cm. Normal parenchymal echogenicity and echotexture. Normal color flow vascularity. No mass or microlithiasis visualized. Right epididymis:  Normal in size and appearance. Left epididymis: The left epididymis is normal in size but demonstrates heterogeneity and hypervascularity which may relate to changes of acute epididymitis. Hydrocele:  None visualized. Varicocele:  None visualized. Pulsed Doppler interrogation of both testes demonstrates normal low resistance arterial and venous waveforms bilaterally. IMPRESSION: 1. Heterogeneous, hypervascular left epididymis, compatible with changes of acute epididymitis. 2. Normal sonographic appearance of the bilateral testicles. Electronically Signed   By: Fidela Salisbury M.D.   On: 12/22/2021 01:28    Assessment & Plan:  26 year old male with alcoholic cirrhosis with ascites presents with intractable pain, nausea, and vomiting with physical exam and imaging consistent with left epididymitis.  Agree with antibiotic therapy per primary team. No indication for urgent urologic intervention. We discussed scrotal support including scrotal elevation, compressive underwear, and cryotherapy. We discussed that his pain will resolve first but he may have persistent edema for several weeks.  Recommendations: -Antibiotics, pain control, and antiemetics per primary team -Follow urine culture, though this will likely be negative given benign UA on admission -Scrotal support, cryotherapy as needed; compressive underwear after discharge  Thank you for involving me  in this patient's care, I will continue to follow along.  Debroah Loop, PA-C 12/22/2021 10:59 AM

## 2021-12-22 NOTE — Assessment & Plan Note (Signed)
Secondary to GI losses from nausea and vomiting Supplement potassium Check magnesium level

## 2021-12-22 NOTE — ED Notes (Signed)
Up to b/r to void, alert, NAD, calm, interactive. Good urine output/ not measured.

## 2021-12-22 NOTE — ED Triage Notes (Signed)
Patient reports LLQ pain and testicle pain x 3 days as well as nausea and vomiting. Reports scrotal swelling. Denies discharge. Denies pain with urination. AOX4. Ambulatory. Resp even, unlabored on RA.

## 2021-12-22 NOTE — ED Notes (Signed)
Report to john, rn

## 2021-12-22 NOTE — Assessment & Plan Note (Signed)
Patient with a history of alcohol abuse who presents for evaluation of diffuse abdominal pain associated with nausea and vomiting. CT scan of abdomen and pelvis shows morphologic features of the liver concerning for cirrhosis, including: marked hepatomegaly, irregular liver contour and severe hepatic steatosis with stigmata of portal venous hypertension noted including varices, splenomegaly and ascites which appears to be new. Paracentesis was ordered with removal of only 1 mL of fluid, apparently there was not much fluid present-preliminary cultures negative. -Patient was counseled for alcohol cessation. -Need to follow-up at liver center in a tertiary care setting.

## 2021-12-22 NOTE — ED Notes (Signed)
Moved to Pod C, no changes.

## 2021-12-22 NOTE — Assessment & Plan Note (Signed)
Most likely secondary to hypokalemia from GI losses and poor oral intake Supplement magnesium

## 2021-12-22 NOTE — ED Provider Notes (Signed)
[facility] Provider Note    Event Date/Time   First MD Initiated Contact with Patient 12/22/21 [phone]     (approximate)   History   Abdominal Pain and Testicle Pain   HPI  Cameron Santiago is a 26 y.o. male with an extensive history of alcohol abuse in spite of his relatively young age, as well as recurrent episodes of alcoholic pancreatitis.  He presents for evaluation of about 3 days of gradually worsening generalized abdominal pain, testicular pain, and nausea and vomiting.  He said the symptoms have been gradually getting worse over this time and it became severe tonight to the point he could not stand it.  He continued to drink alcohol until yesterday.  He is somewhat equivocal or nonspecific about how much he drinks, but he claims he drinks about 1/5 of liquor per day.  He denies fever, chest pain, and shortness of breath.  However he said that he feels like his abdomen is becoming increasingly distended which is making it difficult for him to take deep breaths.  Nothing seems to make the symptoms better and moving around makes it worse.     Physical Exam   Triage Vital Signs: ED Triage Vitals  Enc Vitals Group     BP 12/21/21 2348 (!) 161/98     Pulse Rate 12/21/21 2348 (!) 108     Resp 12/21/21 2348 20     Temp 12/21/21 2348 98.7 F (37.1 C)     Temp Source 12/21/21 2348 Oral     SpO2 12/21/21 2348 95 %     Weight 12/21/21 2349 113.4 kg (250 lb)     Height 12/21/21 2349 1.803 m (5\' 11" )     Head Circumference --      Peak Flow --      Pain Score 12/22/21 0012 8     Pain Loc --      Pain Edu? --      Excl. in [place]? --     Most recent vital signs: Vitals:   12/22/21 0730 12/22/21 0750  BP:  136/71  Pulse: (!) 105 (!) 102  Resp:  (!) 24  Temp:  98.2 F (36.8 C)  SpO2: 91% 95%     General: Awake, appears uncomfortable and chronically ill. CV:  Good peripheral perfusion.  Mild tachycardia. Resp:  Normal effort.  Lungs are clear to  auscultation. Abd:  Tense, distended abdomen, with generalized tenderness to palpation throughout.  No obvious rebound, but he has some guarding. GU:  Uncircumcised male, no penile abnormalities noted.  Tenderness to palpation of the left epididymis.  Possibly some generalized scrotal swelling but difficult to appreciate.  Testes are not severely tender to palpation. Other:  Mild scleral icterus and jaundice, unclear if this is chronic.   ED Results / Procedures / Treatments   Labs (all labs ordered are listed, but only abnormal results are displayed) Labs Reviewed  COMPREHENSIVE METABOLIC PANEL - Abnormal; Notable for the following components:      Result Value   Sodium 134 (*)    Potassium 2.6 (*)    Chloride 93 (*)    Glucose, Bld 158 (*)    BUN <5 (*)    Creatinine, Ser 0.40 (*)    Calcium 7.8 (*)    Total Protein 8.8 (*)    Albumin 3.0 (*)    AST 150 (*)    Alkaline Phosphatase 181 (*)    Total Bilirubin 6.4 (*)  All other components within normal limits  CBC - Abnormal; Notable for the following components:   WBC 12.0 (*)    RBC 3.72 (*)    Hemoglobin 12.9 (*)    HCT 36.4 (*)    MCH 34.7 (*)    RDW 18.3 (*)    All other components within normal limits  URINALYSIS, ROUTINE W REFLEX MICROSCOPIC - Abnormal; Notable for the following components:   Color, Urine AMBER (*)    APPearance CLEAR (*)    Specific Gravity, Urine 1.003 (*)    Hgb urine dipstick SMALL (*)    All other components within normal limits  MAGNESIUM - Abnormal; Notable for the following components:   Magnesium 1.4 (*)    All other components within normal limits  CHLAMYDIA/NGC RT PCR ([facility] ONLY)            LIPASE, BLOOD     RADIOLOGY CT abdomen/pelvis with IV contrast pending at the time of transfer of care to Dr. Erma Heritage.    PROCEDURES:  Critical Care performed: No  Procedures   MEDICATIONS ORDERED IN ED: Medications  cefTRIAXone (ROCEPHIN) 2 g in sodium chloride 0.9 % 100 mL IVPB (2  g Intravenous New Bag/Given 12/22/21 0750)  LORazepam (ATIVAN) injection 1 mg (1 mg Intravenous Given 12/22/21 0634)  sodium chloride 0.9 % bolus 1,000 mL ( Intravenous Infusion Verify 12/22/21 0748)  potassium chloride 10 mEq in 100 mL IVPB (0 mEq Intravenous Stopped 12/22/21 0748)  magnesium sulfate IVPB 2 g 50 mL (0 g Intravenous Stopped 12/22/21 0748)  potassium chloride SA (KLOR-CON M) CR tablet 40 mEq (40 mEq Oral Given 12/22/21 0635)  iohexol (OMNIPAQUE) 300 MG/ML solution 100 mL (100 mLs Intravenous Contrast Given 12/22/21 0649)     IMPRESSION / MDM / ASSESSMENT AND PLAN / ED COURSE  I reviewed the triage vital signs and the nursing notes.                              Differential diagnosis includes, but is not limited to, acute intra-abdominal infection such as appendicitis or biliary disease, worsening liver dysfunction including possible acute hepatitis, intra-abdominal bleeding, electrolyte or metabolic abnormality, epididymitis, orchitis, STD, testicular torsion.  Patient's presentation is most consistent with acute presentation with potential threat to life or bodily function.  The patient is on the cardiac monitor to evaluate for evidence of arrhythmia and/or significant heart rate changes.  Labs/studies ordered: CMP, magnesium, lipase, urinalysis, CBC, GC chlamydia urine test.  Labs are notable for negative STD testing, essentially normal urinalysis, but a mild leukocytosis of 12.0 and grossly abnormal metabolic panel including elevated LFTs with a T. bili of 6.4 and elevated alkaline phosphatase but relatively normal AST and ALT with AST only 150.  Patient's renal function is normal and in fact down which could indicate they have decreased generalized muscle mass.  Potassium level is 2.6.  I ordered 40 mEq potassium by mouth and 10 mill equivalents of IV as well as magnesium 2 g IV.  I also ordered normal saline 1 L IV bolus given his apparent volume depletion.  I ordered  Ativan 1 mg IV given his tachycardia and the probability he may be experiencing some alcohol withdrawal symptoms.  I ordered a CT of the abdomen pelvis with IV contrast to further assess for possible acute intra-abdominal infection.  I am transferring ED care to Dr. Erma Heritage who will follow-up on the imaging and reassess the patient.  FINAL CLINICAL IMPRESSION(S) / ED DIAGNOSES   Final diagnoses:  Generalized abdominal pain  Elevated bilirubin  Elevated LFTs  Alcohol abuse  Hypokalemia  Hypomagnesemia  Epididymitis  Nausea and vomiting, unspecified vomiting type     Rx / DC Orders   ED Discharge Orders     None        Note:  This document was prepared using Dragon voice recognition software and may include unintentional dictation errors.   Loleta Rose, MD 12/22/21 [phone]

## 2021-12-22 NOTE — Progress Notes (Signed)
       CROSS COVER NOTE  NAME: Cameron Santiago MRN: [id number] DOB : [birth year] ATTENDING PHYSICIAN: Collier Bullock, MD    Date of Service   12/22/2021   HPI/Events of Note   Notified of elevated temp and pain refractory to 2 mg IV morphine. Will trial increased dose of morphine before escalating pain regimen.  Interventions   Assessment/Plan: Tylenol Morphine X      This document was prepared using Dragon voice recognition software and may include unintentional dictation errors.  Neomia Glass DNP, MBA, FNP-BC Nurse Practitioner [facility] Pager [phone]

## 2021-12-22 NOTE — ED Provider Notes (Signed)
26 year old male with extensive history of alcohol abuse here with abdominal pain, nausea, difficulty eating and drinking.  Patient tachycardic, dehydrated clinically on arrival.  Lab work shows mild leukocytosis as well as hypomagnesemia and hypokalemia.  Patient given IV and p.o. replacement.  CT of the abdomen and pelvis obtained, and is pending at the time of consult.  Patient also had testicular pain for which he had an ultrasound that showed epididymitis.  No evidence of abscess.  CT abdomen and pelvis shows morphologic features of cirrhosis with ascites.  No surgical abnormality.  No obstruction. Pt remains tachycardic and in pain. Will admit for n/v, epididymitis, dehydration.   Duffy Bruce, MD 12/22/21 [phone]

## 2021-12-22 NOTE — H&P (Signed)
History and Physical    Patient: Cameron Santiago PJA:[id number] DOB: 09-13-1995 DOA: 12/22/2021 DOS: the patient was seen and examined on 12/22/2021 PCP: Pcp, No  Patient coming from: Home  Chief Complaint:  Chief Complaint  Patient presents with   Abdominal Pain   Testicle Pain   HPI: Cameron Santiago is a 26 y.o. male with medical history significant for alcohol abuse, alcoholic liver cirrhosis, GERD who presents to the ER for evaluation of abdominal pain as well as testicular pain.  Patient states symptoms started 3 days ago and is associated with nausea and vomiting.  He rates his abdominal pain a 10 x 10 in intensity at its worst.  Pain is diffuse.  Last bowel movement was 1 day prior to his admission.  He denies having any fever or chills and denies having any hematemesis, no hematochezia or passage of melena stools.  He denies having any alleviating factors but states that any form of movement makes his abdominal pain worse. He complains of lower testicular pain but denies having any penile discharge.  He is currently not sexually active.  Denies having any trauma. He denies having any chest pain, no shortness of breath, no headache, no urinary symptoms, no dizziness, no lightheadedness, no blurred vision or focal deficit. Labs show significant hypokalemia with potassium of 2.6, hypomagnesemia with magnesium level of 1.4, transaminitis with AST/ALT ratio of 2 is to 1 and hyperbilirubinemia Chlamydia and Neisseria gonorrhea species, not detected CT scan of abdomen and pelvis shows morphologic features of the liver concerning for cirrhosis, including: marked hepatomegaly, irregular liver contour and severe hepatic steatosis. Stigmata of portal venous hypertension noted including varices, splenomegaly and ascites. Ascites is new when compared with the previous exam. There are several central low-attenuation areas within the liver which are more conspicuous when compared with  10/08/2021. Favor areas of focal fatty deposition. Consider more definitive characterization with nonemergent liver protocol MRI. Testicular ultrasound shows heterogeneous, hypervascular left epididymis, compatible with changes of acute epididymitis. Normal sonographic appearance of the bilateral testicles Received a dose of Rocephin 2 g IV and doxycycline 100 mg IV x1 Electrolytes have been supplemented as well Patient will be admitted to the hospital for further evaluation   Review of Systems: As mentioned in the history of present illness. All other systems reviewed and are negative. Past Medical History:  Diagnosis Date   Alcohol abuse    Alcoholic pancreatitis    GERD (gastroesophageal reflux disease)    Past Surgical History:  Procedure Laterality Date   APPENDECTOMY     LAPAROSCOPIC APPENDECTOMY N/A 02/15/2015   Procedure: APPENDECTOMY LAPAROSCOPIC;  Surgeon: Natale Lay, MD;  Location: [facility] ORS;  Service: General;  Laterality: N/A;   TONSILLECTOMY     Social History:  reports that he has never smoked. He has never used smokeless tobacco. He reports current alcohol use. He reports that he does not use drugs.  No Known Allergies  Family History  Problem Relation Age of Onset   Arthritis Mother     Prior to Admission medications   Medication Sig Start Date End Date Taking? Authorizing Provider  albuterol (VENTOLIN HFA) 108 (90 Base) MCG/ACT inhaler Inhale 2 puffs into the lungs every 6 (six) hours as needed for wheezing or shortness of breath. 11/22/19   Lucy Chris, PA  amLODipine (NORVASC) 5 MG tablet Take 1 tablet (5 mg total) by mouth daily. 07/17/21 08/16/21  Lanae Boast, MD  dicyclomine (BENTYL) 10 MG capsule Take 1 capsule (10 mg  total) by mouth 3 (three) times daily as needed (abd pain). Patient not taking: Reported on 12/22/2021 10/08/21   Nance Pear, MD  famotidine (PEPCID) 20 MG tablet Take 1 tablet (20 mg total) by mouth 2 (two) times daily. Patient not  taking: Reported on 12/22/2021 04/30/21   Carrie Mew, MD  ondansetron (ZOFRAN-ODT) 4 MG disintegrating tablet Take 1 tablet (4 mg total) by mouth every 8 (eight) hours as needed for nausea or vomiting. Patient not taking: Reported on 07/10/2021 04/30/21   Carrie Mew, MD  sucralfate (CARAFATE) 1 g tablet Take 1 tablet (1 g total) by mouth 4 (four) times daily. Patient not taking: Reported on 12/22/2021 10/08/21   Nance Pear, MD    Physical Exam: Vitals:   12/22/21 0930 12/22/21 1000 12/22/21 1030 12/22/21 1035  BP: (!) 141/73 (!) 141/80 130/67 130/67  Pulse: (!) 104 (!) 111 (!) 109 (!) 107  Resp:   (!) 23   Temp:      TempSrc:      SpO2: 93% 93% 94%   Weight:      Height:       Physical Exam Vitals and nursing note reviewed.  Constitutional:      Appearance: He is ill-appearing.  HENT:     Head: Normocephalic and atraumatic.     Mouth/Throat:     Mouth: Mucous membranes are moist.  Eyes:     General: Scleral icterus present.     Pupils: Pupils are equal, round, and reactive to light.  Cardiovascular:     Rate and Rhythm: Tachycardia present.  Pulmonary:     Effort: Pulmonary effort is normal.     Breath sounds: Normal breath sounds.  Abdominal:     General: Bowel sounds are normal. There is distension.     Palpations: Abdomen is soft.     Tenderness: There is generalized abdominal tenderness.     Comments: Central adiposity, diffusely tender  Skin:    General: Skin is warm and dry.  Neurological:     General: No focal deficit present.     Mental Status: He is alert.     Comments: Tremors in his upper extremities  Psychiatric:        Mood and Affect: Mood normal.        Behavior: Behavior normal.     Data Reviewed: Relevant notes from primary care and specialist visits, past discharge summaries as available in EHR, including Care Everywhere. Prior diagnostic testing as pertinent to current admission diagnoses Updated medications and problem lists for  reconciliation ED course, including vitals, labs, imaging, treatment and response to treatment Triage notes, nursing and pharmacy notes and ED provider's notes Notable results as noted in HPI Labs reviewed.  Magnesium 1.4, lipase 46, sodium 134, potassium 2.6, chloride 93, bicarb 27, glucose 158, BUN less than 5, creatinine 0.40, calcium 7.8, total protein 8.8, albumin 3.0, AST 150, ALT 24, alkaline phosphatase 181, total bilirubin 6.4, white count 12.0, hemoglobin 12.9, hematocrit 36.4, platelet count 191 There are no new results to review at this time.  Assessment and Plan: * Acute epididymitis Patient presents for evaluation of left testicular pain Testicle ultrasound is concerning for acute epididymitis We will place patient empirically on Rocephin and doxycycline Consult urology  Alcoholic cirrhosis of liver with ascites ([place]) Patient with a history of alcohol abuse who presents for evaluation of diffuse abdominal pain associated with nausea and vomiting. CT scan of abdomen and pelvis shows morphologic features of the liver concerning for cirrhosis,  including: marked hepatomegaly, irregular liver contour and severe hepatic steatosis with stigmata of portal venous hypertension noted including varices, splenomegaly and ascites which appears to be new. Abdominal pain is concerning for possible SBP and we will place patient empirically on Rocephin 2 g IV daily  Alcohol dependence with withdrawal (HCC) Patient admits to daily alcohol use with symptoms of alcohol withdrawal when he does not drink He already has mild tremors and is tachycardic We will place patient on CIWA  protocol and administer lorazepam for CIWA score of 8 or greater Place patient on MVI/thiamine/folic acid  Hypomagnesemia Most likely secondary to hypokalemia from GI losses and poor oral intake Supplement magnesium  Hypokalemia Secondary to GI losses from nausea and vomiting Supplement potassium Check magnesium  level  GERD (gastroesophageal reflux disease) Place patient on IV PPI      Advance Care Planning:   Code Status: Full Code   Consults: Urology  Family Communication: Greater than 50% of time was spent discussing patient's condition and plan of care with him at the bedside.  All questions and concerns have been addressed.  He verbalizes understanding and agrees with the plan.  Severity of Illness: The appropriate patient status for this patient is INPATIENT. Inpatient status is judged to be reasonable and necessary in order to provide the required intensity of service to ensure the patient's safety. The patient's presenting symptoms, physical exam findings, and initial radiographic and laboratory data in the context of their chronic comorbidities is felt to place them at high risk for further clinical deterioration. Furthermore, it is not anticipated that the patient will be medically stable for discharge from the hospital within 2 midnights of admission.   * I certify that at the point of admission it is my clinical judgment that the patient will require inpatient hospital care spanning beyond 2 midnights from the point of admission due to high intensity of service, high risk for further deterioration and high frequency of surveillance required.*  Author: Lucile Shutters, MD 12/22/2021 10:49 AM  For on call review [email or web address].

## 2021-12-22 NOTE — Assessment & Plan Note (Addendum)
Patient presents for evaluation of left testicular pain Testicle ultrasound is concerning for acute epididymitis We will place patient empirically on Rocephin and doxycycline Consult urology-recommending continuation of antibiotics, scrotal support, and cryotherapy as needed. HIV, chlamydia and gonorrhea PCR negative. -Continue with Rocephin and doxycycline -Continue with supportive care

## 2021-12-22 NOTE — Assessment & Plan Note (Signed)
Patient admits to daily alcohol use with symptoms of alcohol withdrawal when he does not drink He already has mild tremors and is tachycardic We will place patient on CIWA  protocol and administer lorazepam for CIWA score of 8 or greater Place patient on MVI/thiamine/folic acid

## 2021-12-23 ENCOUNTER — Encounter: Payer: Self-pay | Admitting: Internal Medicine

## 2021-12-23 ENCOUNTER — Inpatient Hospital Stay: Payer: Self-pay

## 2021-12-23 LAB — BASIC METABOLIC PANEL
Anion gap: 9 (ref 5–15)
BUN: <5 mg/dL — ABNORMAL LOW (ref 6–20)
CO2: 28 mmol/L (ref 22–32)
Calcium: 7 mg/dL — ABNORMAL LOW (ref 8.9–10.3)
Chloride: 99 mmol/L (ref 98–111)
Creatinine, Ser: <0.3 mg/dL — ABNORMAL LOW (ref 0.61–1.24)
Glucose, Bld: 94 mg/dL (ref 70–99)
Potassium: 3.7 mmol/L (ref 3.5–5.1)
Sodium: 136 mmol/L (ref 135–145)

## 2021-12-23 LAB — CBC
HCT: 35.1 % — ABNORMAL LOW (ref 39.0–52.0)
Hemoglobin: 12.2 g/dL — ABNORMAL LOW (ref 13.0–17.0)
MCH: 34.2 pg — ABNORMAL HIGH (ref 26.0–34.0)
MCHC: 34.8 g/dL (ref 30.0–36.0)
MCV: 98.3 fL (ref 80.0–100.0)
Platelets: 180 10*3/uL (ref 150–400)
RBC: 3.57 MIL/uL — ABNORMAL LOW (ref 4.22–5.81)
RDW: 18.7 % — ABNORMAL HIGH (ref 11.5–15.5)
WBC: 12 10*3/uL — ABNORMAL HIGH (ref 4.0–10.5)
nRBC: 0.2 % (ref 0.0–0.2)

## 2021-12-23 LAB — MAGNESIUM: Magnesium: 1.3 mg/dL — ABNORMAL LOW (ref 1.7–2.4)

## 2021-12-23 LAB — BODY FLUID CELL COUNT WITH DIFFERENTIAL
Eos, Fluid: 1 %
Lymphs, Fluid: 32 %
Monocyte-Macrophage-Serous Fluid: 43 %
Neutrophil Count, Fluid: 24 %
Total Nucleated Cell Count, Fluid: 373 cu mm

## 2021-12-23 LAB — GLUCOSE, PLEURAL OR PERITONEAL FLUID: Glucose, Fluid: 97 mg/dL

## 2021-12-23 LAB — ALBUMIN, PLEURAL OR PERITONEAL FLUID: Albumin, Fluid: <1.5 g/dL

## 2021-12-23 LAB — HIV ANTIBODY (ROUTINE TESTING W REFLEX): HIV Screen 4th Generation wRfx: NONREACTIVE

## 2021-12-23 MED ORDER — MAGNESIUM SULFATE 2 GM/50ML IV SOLN
2.0000 g | Freq: Once | INTRAVENOUS | Status: AC
  Administered 2021-12-23: 2 g via INTRAVENOUS
  Filled 2021-12-23: qty 50

## 2021-12-23 MED ORDER — LIDOCAINE HCL (PF) 1 % IJ SOLN
10.0000 mL | Freq: Once | INTRAMUSCULAR | Status: AC
  Administered 2021-12-23: 10 mL via SUBCUTANEOUS

## 2021-12-23 NOTE — Procedures (Signed)
PROCEDURE SUMMARY:  Successful ultrasound guided paracentesis from the right lower  quadrant.  Yielded 1 ml of straw  colored fluid.  No immediate complications.  The patient tolerated the procedure well.   Specimen was sent for labs.  EBL < 74mL  If the patient eventually requires >/=2 paracenteses in a 30 day period, screening evaluation by the [facility] will be assessed.

## 2021-12-23 NOTE — Progress Notes (Signed)
PROGRESS NOTE    Cameron Santiago  QQP:[id number] DOB: 1995/11/23 DOA: 12/22/2021.  PCP: Pcp, No   Brief Narrative:  This 26 years old male with PMH significant for alcohol abuse, alcoholic liver cirrhosis, GERD who presents to the ER for evaluation of abdominal pain as well as testicular pain. Patient states symptoms started 3 days ago and is associated with nausea and vomiting.He complains of lower testicular pain but denies having any penile discharge.  He is currently not sexually active.  Work-up in the ED showed hypokalemia, hypomagnesemia GC and chlamydia cultures negative.  CT abdomen showed morphological features of liver cirrhosis, including hepatomegaly, irregular liver contour and severe hepatic steatosis.  Testicular ultrasound showed finding consistent with acute epididymitis.  Patient is admitted for further evaluation.  He is started on ceftriaxone and doxycycline.  Urology was consulted, recommended to continue antibiotics, adequate pain control, scrotal support, compressive underwear, cryotherapy as needed.  Assessment & Plan:   Principal Problem:   Acute epididymitis Active Problems:   Alcohol dependence with withdrawal (HCC)   Alcoholic cirrhosis of liver with ascites (HCC)   Hypokalemia   Hypomagnesemia   GERD (gastroesophageal reflux disease)  Acute epididymitis: Patient presented with left testicular pain associated with nausea and vomiting. He denies any trauma or denies being sexually active. Testicular ultrasound concerning for acute epididymitis. Empirically started on Rocephin and doxycycline. Urology is consulted,  recommended to continue antibiotics Scrotal support, compressive underwear and cryotherapy as needed. GC and Chlamydia cultures negative   Alcoholic cirrhosis with ascites: Patient with history of EtOH abuse presented for the evaluation of diffuse abdominal pain associated with nausea and vomiting CT scan of abdomen and pelvis shows  morphologic features of the liver concerning for cirrhosis. Abdominal pain could be concerning for  possible SBP. IR consulted for ultrasound-guided paracentesis. Start ceftriaxone if positive for SBP.   Alcohol dependence with withdrawal: Patient admits to daily alcohol use, with symptoms of alcohol withdrawal when he does not drink. He appears tremulous and is tachycardic. Continue CIWA protocol, monitor neuro check. Continue multivitamin, thiamine and folic acid.   Hypomagnesemia: Likely from GI losses and decreased p.o. intake Replaced.  Continue to monitor  Hypokalemia: Secondary to GI losses from nausea and vomiting. Replaced.  Continue to monitor  GERD: Continue pantoprazole.   Obesity: Diet and exercise discussed in detail Estimated body mass index is 37.63 kg/m as calculated from the following:   Height as of this encounter: 5\' 11"  (1.803 m).   Weight as of this encounter: 122.4 kg.    DVT prophylaxis: Lovenox Code Status: Full code Family Communication: No family at bed side. Disposition Plan: Status is: Inpatient Remains inpatient appropriate because: Admitted for epididymitis and abdominal pain with ascites.  Started on antibiotics, needs ultrasound-guided paracentesis may require antibiotic for SBP if positive.  Anticipated discharge home in 1 to 2 days  Consultants:  None  Procedures: Ultrasound-guided paracentesis Antimicrobials: Anti-infectives (From admission, onward)    Start     Dose/Rate Route Frequency Ordered Stop   12/23/21 0000  cefTRIAXone (ROCEPHIN) 2 g in sodium chloride 0.9 % 100 mL IVPB        2 g 200 mL/hr over 30 Minutes Intravenous  Once 12/22/21 1031 12/23/21 0423   12/22/21 2300  doxycycline (VIBRAMYCIN) 100 mg in sodium chloride 0.9 % 250 mL IVPB        100 mg 125 mL/hr over 120 Minutes Intravenous Every 12 hours 12/22/21 1031     12/22/21 1000  doxycycline (VIBRAMYCIN)  100 mg in sodium chloride 0.9 % 250 mL IVPB        100  mg 125 mL/hr over 120 Minutes Intravenous  Once 12/22/21 0949 12/22/21 1540   12/22/21 0930  levofloxacin (LEVAQUIN) IVPB 500 mg  Status:  Discontinued        500 mg 100 mL/hr over 60 Minutes Intravenous  Once 12/22/21 0924 12/22/21 0949   12/22/21 0730  cefTRIAXone (ROCEPHIN) 2 g in sodium chloride 0.9 % 100 mL IVPB        2 g 200 mL/hr over 30 Minutes Intravenous  Once 12/22/21 0724 12/22/21 3335        Subjective: Patient was seen and examined at bedside.  Overnight events noted. Patient seems tremulous, shaking while talking. He is alert and oriented x 3. He reports abdominal pain is improving,  denies any nausea and vomiting  Objective: Vitals:   12/23/21 0101 12/23/21 0142 12/23/21 0736 12/23/21 1120  BP: (!) 134/99 (!) 158/99 (!) 141/90 (!) 144/91  Pulse: (!) 108 (!) 111 (!) 111 (!) 109  Resp: (!) 28 18 19 20   Temp:  98.5 F (36.9 C) 98.4 F (36.9 C) 98.6 F (37 C)  TempSrc:   Oral Oral  SpO2: 94% 92% 93% 94%  Weight:      Height:        Intake/Output Summary (Last 24 hours) at 12/23/2021 1327 Last data filed at 12/23/2021 1300 Gross per 24 hour  Intake 2594.88 ml  Output 1400 ml  Net 1194.88 ml   Filed Weights   12/21/21 2349 12/22/21 2116  Weight: 113.4 kg 122.4 kg    Examination:  General exam: Appears comfortable, not in any acute distress, tremulous Respiratory system: CTA bilaterally, respiratory effort normal, RR 15 Cardiovascular system: S1 & S2 heard, regular rate and rhythm, no murmur. Gastrointestinal system: Abdomen is soft, distended, non tender, BS+ Central nervous system: Alert and oriented X 3. No focal neurological deficits. Extremities: No edema, no cyanosis, no clubbing Skin: No rashes, lesions or ulcers Psychiatry: Judgement and insight appear normal. Mood & affect appropriate.     Data Reviewed: I have personally reviewed following labs and imaging studies  CBC: Recent Labs  Lab 12/21/21 2353 12/23/21 0242  WBC 12.0* 12.0*   HGB 12.9* 12.2*  HCT 36.4* 35.1*  MCV 97.8 98.3  PLT 191 180   Basic Metabolic Panel: Recent Labs  Lab 12/21/21 2353 12/23/21 0242  NA 134* 136  K 2.6* 3.7  CL 93* 99  CO2 27 28  GLUCOSE 158* 94  BUN <5* <5*  CREATININE 0.40* <0.30*  CALCIUM 7.8* 7.0*  MG 1.4* 1.3*   GFR: CrCl cannot be calculated (This lab value cannot be used to calculate CrCl because it is not a number: <0.30). Liver Function Tests: Recent Labs  Lab 12/21/21 2353  AST 150*  ALT 24  ALKPHOS 181*  BILITOT 6.4*  PROT 8.8*  ALBUMIN 3.0*   Recent Labs  Lab 12/21/21 2353  LIPASE 46   No results for input(s): "AMMONIA" in the last 168 hours. Coagulation Profile: No results for input(s): "INR", "PROTIME" in the last 168 hours. Cardiac Enzymes: No results for input(s): "CKTOTAL", "CKMB", "CKMBINDEX", "TROPONINI" in the last 168 hours. BNP (last 3 results) No results for input(s): "PROBNP" in the last 8760 hours. HbA1C: No results for input(s): "HGBA1C" in the last 72 hours. CBG: No results for input(s): "GLUCAP" in the last 168 hours. Lipid Profile: No results for input(s): "CHOL", "HDL", "LDLCALC", "TRIG", "CHOLHDL", "  LDLDIRECT" in the last 72 hours. Thyroid Function Tests: No results for input(s): "TSH", "T4TOTAL", "FREET4", "T3FREE", "THYROIDAB" in the last 72 hours. Anemia Panel: No results for input(s): "VITAMINB12", "FOLATE", "FERRITIN", "TIBC", "IRON", "RETICCTPCT" in the last 72 hours. Sepsis Labs: No results for input(s): "PROCALCITON", "LATICACIDVEN" in the last 168 hours.  Recent Results (from the past 240 hour(s))  Chlamydia/NGC rt PCR ([facility] only)     Status: None   Collection Time: 12/21/21 11:53 PM   Specimen: Urine  Result Value Ref Range Status   Specimen source GC/Chlam CHLAMYDIA SPECIES  Final   Chlamydia Tr NOT DETECTED NOT DETECTED Final   N gonorrhoeae NOT DETECTED NOT DETECTED Final    Comment: (NOTE) This CT/NG assay has not been evaluated in patients with a  history of  hysterectomy. Performed at [facility], [address], [place], [place] [address]     Radiology Studies: CT ABDOMEN PELVIS W CONTRAST  Result Date: 12/22/2021 CLINICAL DATA:  Nausea and vomiting.  Abdominal pain. EXAM: CT ABDOMEN AND PELVIS WITH CONTRAST TECHNIQUE: Multidetector CT imaging of the abdomen and pelvis was performed using the standard protocol following bolus administration of intravenous contrast. RADIATION DOSE REDUCTION: This exam was performed according to the departmental dose-optimization program which includes automated exposure control, adjustment of the mA and/or kV according to patient size and/or use of iterative reconstruction technique. CONTRAST:  OMNIPAQUE IOHEXOL 300 MG/ML  SOLN COMPARISON:  10/08/2021 FINDINGS: Lower chest: No acute abnormality. Hepatobiliary: Marked diffuse hepatic steatosis. The liver is enlarged measuring 30 point 1 cm along the midclavicular line. More focal areas of low-attenuation are identified within segment 4 and segment 5 centrally which are more conspicuous when compared with the previous exam, image 41/2 and image 48/2. Gallbladder is unremarkable. No signs of bile duct dilatation. Pancreas: Unremarkable. No pancreatic ductal dilatation or surrounding inflammatory changes. Spleen: Spleen is enlarged measuring 15.7 cm. No focal splenic abnormality. Adrenals/Urinary Tract: Normal adrenal glands. Kidneys are unremarkable. Urinary bladder appears normal. Stomach/Bowel: Stomach appears within normal limits. The appendix is surgically absent. No bowel wall thickening, inflammation, or distension. Vascular/Lymphatic: Recanalization of the umbilical vein. Multiple small varicosities identified within the left hemiabdomen. Portal vein, portal venous confluence, splenic vein and SMV remain patent. No signs of abdominopelvic adenopathy. Reproductive: Prostate is unremarkable. Other: There is abdominopelvic ascites scratch set  small volume of perihepatic ascites is noted. Ascites extends along both pericolic gutters into the pelvis. This is new when compared with the previous exam. Musculoskeletal: No acute or significant osseous findings. IMPRESSION: 1. Morphologic features of the liver concerning for cirrhosis, including: Marked hepatomegaly, irregular liver contour and severe hepatic steatosis. 2. Stigmata of portal venous hypertension noted including varices, splenomegaly and ascites. Ascites is new when compared with the previous exam. 3. There are several central low-attenuation areas within the liver which are more conspicuous when compared with 10/08/2021. Favor areas of focal fatty deposition. Consider more definitive characterization with nonemergent liver protocol MRI. Electronically Signed   By: Signa Kell M.D.   On: 12/22/2021 08:05   US SCROTUM W/DOPPLER  Result Date: 12/22/2021 CLINICAL DATA:  Left testicular pain EXAM: SCROTAL ULTRASOUND DOPPLER ULTRASOUND OF THE TESTICLES TECHNIQUE: Complete ultrasound examination of the testicles, epididymis, and other scrotal structures was performed. Color and spectral Doppler ultrasound were also utilized to evaluate blood flow to the testicles. COMPARISON:  None Available. FINDINGS: Right testicle Measurements: 4.9 x 2.2 x 3.3 cm. Normal parenchymal echogenicity and echotexture. Normal color flow vascularity. No mass  or microlithiasis visualized. Left testicle Measurements: 4.3 x 2.5 x 2.8 cm. Normal parenchymal echogenicity and echotexture. Normal color flow vascularity. No mass or microlithiasis visualized. Right epididymis:  Normal in size and appearance. Left epididymis: The left epididymis is normal in size but demonstrates heterogeneity and hypervascularity which may relate to changes of acute epididymitis. Hydrocele:  None visualized. Varicocele:  None visualized. Pulsed Doppler interrogation of both testes demonstrates normal low resistance arterial and venous  waveforms bilaterally. IMPRESSION: 1. Heterogeneous, hypervascular left epididymis, compatible with changes of acute epididymitis. 2. Normal sonographic appearance of the bilateral testicles. Electronically Signed   By: Helyn Numbers M.D.   On: 12/22/2021 01:28    Scheduled Meds:  enoxaparin (LOVENOX) injection  0.5 mg/kg Subcutaneous Q24H   folic acid  1 mg Oral Daily   LORazepam  0-4 mg Oral Q6H   Followed by   Melene Muller ON 12/24/2021] LORazepam  0-4 mg Oral Q12H   multivitamin with minerals  1 tablet Oral Daily   pantoprazole (PROTONIX) IV  40 mg Intravenous Q24H   thiamine  100 mg Oral Daily   Or   thiamine  100 mg Intravenous Daily   Continuous Infusions:  sodium chloride 100 mL/hr at 12/23/21 0030   doxycycline (VIBRAMYCIN) IV Stopped (12/23/21 1212)     LOS: 1 day    Time spent: 50 mins    Zeyna Mkrtchyan, MD [facility]   If 7PM-7AM, please contact night-coverage

## 2021-12-23 NOTE — Plan of Care (Signed)

## 2021-12-23 NOTE — ED Notes (Signed)
Patient is ready for transport.  

## 2021-12-23 NOTE — TOC Initial Note (Signed)
Transition of Care [facility]) - Initial/Assessment Note    Patient Details  Name: Cameron Santiago MRN: [id number] Date of Birth: [birth year]  Transition of Care [facility]) CM/SW Contact:    Alberteen Sam, LCSW Phone Number: 12/23/2021, 11:09 AM  Clinical Narrative:                  CSW met with patient at bedside for [facility] consult for SA resources. Patient reports he declines SA resources at this time. CSW notes patient has no PCP listed in chart, he confirms no PCP and no insurance. CSW left [facility] paperwork at bedside, patient falling asleep during assessment. Patient was encouraged to follow up with [facility] to establish f/u PCP care.   Please consult TOC should additional needs arise.    Expected Discharge Plan: Home/Self Care Barriers to Discharge: Continued Medical Work up   Patient Goals and CMS Choice Patient states their goals for this hospitalization and ongoing recovery are:: to go home CMS Medicare.gov Compare Post Acute Care list provided to:: Patient Choice offered to / list presented to : Patient  Expected Discharge Plan and Services Expected Discharge Plan: Home/Self Care                                              Prior Living Arrangements/Services   Lives with:: Self                   Activities of Daily Living Home Assistive Devices/Equipment: Gilford Rile (specify type) ADL Screening (condition at time of admission) Patient's cognitive ability adequate to safely complete daily activities?: No Is the patient deaf or have difficulty hearing?: No Does the patient have difficulty seeing, even when wearing glasses/contacts?: No Does the patient have difficulty concentrating, remembering, or making decisions?: No Patient able to express need for assistance with ADLs?: No Does the patient have difficulty dressing or bathing?: No Independently performs ADLs?: Yes (appropriate for developmental age) Does the patient have difficulty walking  or climbing stairs?: No Weakness of Legs: Both Weakness of Arms/Hands: Both  Permission Sought/Granted                  Emotional Assessment       Orientation: : Oriented to Self, Oriented to Place, Oriented to  Time, Oriented to Situation Alcohol / Substance Use: Not Applicable Psych Involvement: No (comment)  Admission diagnosis:  Hypokalemia [E87.6] Hypomagnesemia [E83.42] Alcohol abuse [F10.10] Epididymitis [N45.1] Acute epididymitis [N45.1] Generalized abdominal pain [R10.84] Elevated LFTs [R79.89] Elevated bilirubin [R17] Nausea and vomiting, unspecified vomiting type [R11.2] Patient Active Problem List   Diagnosis Date Noted   Acute epididymitis 42/59/5638   Alcoholic cirrhosis of liver with ascites ([place]) 12/22/2021   Hypophosphatemia 07/13/2021   Hypomagnesemia 07/12/2021   Constipation 07/12/2021   Leukocytosis 07/11/2021   Hypokalemia 07/11/2021   Acute recurrent pancreatitis 07/10/2021   Transaminitis 07/10/2021   Alcohol dependence with withdrawal ([place]) 07/10/2021   GERD (gastroesophageal reflux disease) 07/10/2021   Elevated blood pressure reading 75/64/3329   Alcoholic pancreatitis 51/88/4166   Acute appendicitis 02/14/2015   PCP:  Pcp, No Pharmacy:   [id number] - [place], [place] - [address] [place]: [phone]: [address] [facility]  [place] [address] Phone: [phone] Fax: [phone]     Social Determinants of Health (SDOH) Interventions    Readmission Risk Interventions     No data to display

## 2021-12-23 NOTE — ED Notes (Signed)
Report given to Sophia, RN.  

## 2021-12-24 DIAGNOSIS — E669 Obesity, unspecified: Secondary | ICD-10-CM | POA: Diagnosis present

## 2021-12-24 LAB — CBC
HCT: 35.3 % — ABNORMAL LOW (ref 39.0–52.0)
Hemoglobin: 12.4 g/dL — ABNORMAL LOW (ref 13.0–17.0)
MCH: 35 pg — ABNORMAL HIGH (ref 26.0–34.0)
MCHC: 35.1 g/dL (ref 30.0–36.0)
MCV: 99.7 fL (ref 80.0–100.0)
Platelets: 147 10*3/uL — ABNORMAL LOW (ref 150–400)
RBC: 3.54 MIL/uL — ABNORMAL LOW (ref 4.22–5.81)
RDW: 18.5 % — ABNORMAL HIGH (ref 11.5–15.5)
WBC: 10.9 10*3/uL — ABNORMAL HIGH (ref 4.0–10.5)
nRBC: 0.4 % — ABNORMAL HIGH (ref 0.0–0.2)

## 2021-12-24 LAB — PHOSPHORUS: Phosphorus: 1.8 mg/dL — ABNORMAL LOW (ref 2.5–4.6)

## 2021-12-24 LAB — MAGNESIUM: Magnesium: 1.6 mg/dL — ABNORMAL LOW (ref 1.7–2.4)

## 2021-12-24 MED ORDER — ACETAMINOPHEN 325 MG PO TABS
325.0000 mg | ORAL_TABLET | Freq: Once | ORAL | Status: AC
  Administered 2021-12-24: 325 mg via ORAL
  Filled 2021-12-24: qty 1

## 2021-12-24 MED ORDER — MAGNESIUM SULFATE 2 GM/50ML IV SOLN
2.0000 g | Freq: Once | INTRAVENOUS | Status: AC
  Administered 2021-12-24: 2 g via INTRAVENOUS
  Filled 2021-12-24: qty 50

## 2021-12-24 MED ORDER — POTASSIUM PHOSPHATES 15 MMOLE/5ML IV SOLN
30.0000 mmol | Freq: Once | INTRAVENOUS | Status: AC
  Administered 2021-12-24: 30 mmol via INTRAVENOUS
  Filled 2021-12-24: qty 10

## 2021-12-24 MED ORDER — MORPHINE SULFATE (PF) 4 MG/ML IV SOLN
4.0000 mg | Freq: Once | INTRAVENOUS | Status: AC
  Administered 2021-12-24: 4 mg via INTRAVENOUS
  Filled 2021-12-24: qty 1

## 2021-12-24 NOTE — Assessment & Plan Note (Signed)
Phosphorous of 1.8. -Replace phosphorus and monitor

## 2021-12-24 NOTE — Consult Note (Signed)
PHARMACY CONSULT NOTE - FOLLOW UP  Pharmacy Consult for Electrolyte Monitoring and Replacement   Recent Labs: Potassium (mmol/L)  Date Value  12/23/2021 3.7   Magnesium (mg/dL)  Date Value  12/24/2021 1.6 (L)   Calcium (mg/dL)  Date Value  12/23/2021 7.0 (L)   Albumin (g/dL)  Date Value  12/21/2021 3.0 (L)   Phosphorus (mg/dL)  Date Value  12/24/2021 1.8 (L)   Sodium (mmol/L)  Date Value  12/23/2021 136    Assessment: 26 yo male admitted for treatment of acute epididymitis. PMH includes alcohol abuse, liver cirrhosis, and GERD. Pharmacy consulted to manage electrolytes.  Goal of Therapy:  Electrolytes WNL  Plan:  Mg 1.6 today, will replace with MgSulfate 2gm x 1 dose. Phos 1.8 with K 3.7 will replace with Kphos 30 mmol IV x 1 dose Plan to follow AM labs and replace electrolytes as needed.  Manford Sprong Rodriguez-Guzman PharmD, BCPS 12/24/2021 8:26 AM

## 2021-12-24 NOTE — Progress Notes (Signed)
       CROSS COVER NOTE  NAME: Cameron Santiago MRN: [id number] DOB : 1995-11-04 ATTENDING PHYSICIAN: Lorella Nimrod, MD    Date of Service   12/24/2021   HPI/Events of Note   Notified of elevated temp and abdominal pain refractory to 2 mg IV morphine.   Interventions   Assessment/Plan:  Tylenol Morphine    This document was prepared using Dragon voice recognition software and may include unintentional dictation errors.  Neomia Glass DNP, MBA, FNP-BC Nurse Practitioner [facility] Pager [phone]

## 2021-12-24 NOTE — Assessment & Plan Note (Signed)
Estimated body mass index is 37.63 kg/m as calculated from the following:   Height as of this encounter: 5\' 11"  (1.803 m).   Weight as of this encounter: 122.4 kg.   -Encouraged weight loss -Will complicate overall prognosis

## 2021-12-24 NOTE — Hospital Course (Addendum)
Taken from prior notes.   26 years old male with PMH significant for alcohol abuse, alcoholic liver cirrhosis, GERD who presents to the ER for evaluation of abdominal pain as well as testicular pain. Patient states symptoms started 3 days ago and is associated with nausea and vomiting.He complains of lower testicular pain but denies having any penile discharge.  He is currently not sexually active.  Work-up in the ED showed hypokalemia, hypomagnesemia GC and chlamydia cultures negative.  CT abdomen showed morphological features of liver cirrhosis, including hepatomegaly, irregular liver contour and severe hepatic steatosis.  Testicular ultrasound showed finding consistent with acute epididymitis.  Patient is admitted for further evaluation.  He is started on ceftriaxone and doxycycline.  Urology was consulted, recommended to continue antibiotics, adequate pain control, scrotal support, compressive underwear, cryotherapy as needed.  11/1: Patient continued to have significant pain.  Multiple electrolyte abnormalities which include hypomagnesemia, hypophosphatemia and hypokalemia which are being repleted. Gonorrhea and chlamydia PCR negative, HIV negative.  Ordered RPR. Patient also has paracentesis done yesterday with removal of only 1 mL of fluid, apparently there was no significant ascites.  Preliminary cultures negative. CIWA score of 4.  Counseling was provided for alcohol cessation.  11/2: Patient had another spike of fever up to 101.5 overnight.,Antibiotics switched to Levaquin. Blood pressure remained elevated-restarting home amlodipine. Potassium and corrected calcium remained low-being repleted. Worsening T. bili at 9.8 and INR 1.7-GI was consulted. Multiple labs to start the work-up of this new diagnosis of liver cirrhosis especially at this age was ordered by GI. Ordered RUQ [place] for worsening abdominal distension.  11/3: Some improvement in abdominal distention after having 2-3 bowel  movements. Right upper quadrant ultrasound with trace ascites only. GI switched him to MiraLAX due to concern of bloating with lactulose. GI also added low-dose Lasix, done and rifaximin. Labs for secondary causes of liver cirrhosis with negative ANA negative anti-smooth muscle antibody, mitochondrial antibody, phenotypes microsomal antibody.  Pending alpha 1 antitrypsin, ceruloplasmin, celiac disease panel and B1. Became febrile again despite change to antibiotic.  Sending blood and urine cultures to rule out any resistant bacteria.Marland Kitchen  11/4: Patient remained febrile, continued to have significant abdominal pain.  Switching antibiotics to Zosyn while waiting for urine and blood cultures.  Ordered repeat scrotal ultrasound  to rule out any abscesses.  Ordered an MRI of liver as advised on CT abdomen for further characterization of his liver lesion as he has deteriorating liver function. Worsening T. bili and INR.  11/5: Patient continued to have a low-grade fever.  Liver MRI with concern of Karlene Lineman with cirrhosis, focal fat deposits and no evidence of HCC.  Gallbladder wall thickening with no cholecystitis.  Very mild ascites.  Blood and urine cultures remain negative. MELD sodium 23, child class C, worsening T. bili with slight improvement in INR secondary liver disease work-up with mildly elevated ceruloplasmin 31.9.  Celiac disease panel with elevated IgA at 767.  Rest of the work-up negative. Alpha 1 antitrypsin and B1 levels pending. GI increase the dose of Lasix to 40 and spironolactone to 100.  11/6: Clinically feeling better with less abdominal pain.  Hepatic functions deteriorated but seems stable with INR of 1.7 and T. bili of 13.  Remained afebrile over the past 24 hours. We will continue with Zosyn for another day.  11/7: Maximum temperature of 99.9 over the past 24 hours, INR at 1.6 with slight worsening of T. bili at 13.7, little worsening of leukocytosis at 11.8 but all cell lines  decreased might be hemoconcentration with diuretics-giving some IV fluid. GI wants to keep him in the hospital until T. bili started trending down. Will remain on Zosyn.

## 2021-12-24 NOTE — Progress Notes (Signed)
Progress Note   Patient: Cameron Santiago TIR:[id number] DOB: 05/01/1995 DOA: 12/22/2021     2 DOS: the patient was seen and examined on 12/24/2021   Brief hospital course: Taken from prior notes.   26 years old male with PMH significant for alcohol abuse, alcoholic liver cirrhosis, GERD who presents to the ER for evaluation of abdominal pain as well as testicular pain. Patient states symptoms started 3 days ago and is associated with nausea and vomiting.He complains of lower testicular pain but denies having any penile discharge.  He is currently not sexually active.  Work-up in the ED showed hypokalemia, hypomagnesemia GC and chlamydia cultures negative.  CT abdomen showed morphological features of liver cirrhosis, including hepatomegaly, irregular liver contour and severe hepatic steatosis.  Testicular ultrasound showed finding consistent with acute epididymitis.  Patient is admitted for further evaluation.  He is started on ceftriaxone and doxycycline.  Urology was consulted, recommended to continue antibiotics, adequate pain control, scrotal support, compressive underwear, cryotherapy as needed.  11/1: Patient continued to have significant pain.  Multiple electrolyte abnormalities which include hypomagnesemia, hypophosphatemia and hypokalemia which are being repleted. Gonorrhea and chlamydia PCR negative, HIV negative.  Ordered RPR. Patient also has paracentesis done yesterday with removal of only 1 mL of fluid, apparently there was no significant ascites.  Preliminary cultures negative. CIWA score of 4.  Counseling was provided for alcohol cessation.   Assessment and Plan: * Acute epididymitis Patient presents for evaluation of left testicular pain Testicle ultrasound is concerning for acute epididymitis We will place patient empirically on Rocephin and doxycycline Consult urology-recommending continuation of antibiotics, scrotal support, and cryotherapy as needed. HIV, chlamydia and  gonorrhea PCR negative. -Continue with Rocephin and doxycycline -Continue with supportive care  Alcoholic cirrhosis of liver with ascites (HCC) Patient with a history of alcohol abuse who presents for evaluation of diffuse abdominal pain associated with nausea and vomiting. CT scan of abdomen and pelvis shows morphologic features of the liver concerning for cirrhosis, including: marked hepatomegaly, irregular liver contour and severe hepatic steatosis with stigmata of portal venous hypertension noted including varices, splenomegaly and ascites which appears to be new. Paracentesis was ordered with removal of only 1 mL of fluid, apparently there was not much fluid present-preliminary cultures negative. -Patient was counseled for alcohol cessation. -Need to follow-up at liver center in a tertiary care setting.  Alcohol dependence with withdrawal (HCC) Patient admits to daily alcohol use with symptoms of alcohol withdrawal when he does not drink CIWA score of 4 -Continue with CIWA protocol -Continue with supportive care and supplements  Hypokalemia Improving. -Monitor potassium and replete as needed  Hypomagnesemia Magnesium 1.6. -Replace magnesium and monitor  Hypophosphatemia Phosphorous of 1.8. -Replace phosphorus and monitor  GERD (gastroesophageal reflux disease) Place patient on IV PPI  Obesity (BMI 30-39.9) Estimated body mass index is 37.63 kg/m as calculated from the following:   Height as of this encounter: 5\' 11"  (1.803 m).   Weight as of this encounter: 122.4 kg.   -Encouraged weight loss -Will complicate overall prognosis   Subjective: Patient was seen and examined today.  Continued to have lower abdomen and perineal pain.  No nausea or vomiting.  He was counseled again against alcohol abuse, per patient he is done with that.  Physical Exam: Vitals:   12/24/21 0040 12/24/21 0352 12/24/21 0744 12/24/21 1145  BP: (!) 164/95 (!) 153/90 (!) 141/82 (!) 150/81   Pulse: (!) 109 (!) 108 (!) 108 100  Resp: (!) 24 (!) 24  16 16  Temp: 100 F (37.8 C) 99 F (37.2 C) 98.4 F (36.9 C) 98.3 F (36.8 C)  TempSrc: Oral     SpO2: 94% 94% 91% 94%  Weight:      Height:       General.  Obese gentleman, in no acute distress. Pulmonary.  Lungs clear bilaterally, normal respiratory effort. CV.  Regular rate and rhythm, no JVD, rub or murmur. Abdomen.  Soft, nontender, nondistended, BS positive. CNS.  Alert and oriented .  No focal neurologic deficit. Extremities.  No edema, no cyanosis, pulses intact and symmetrical. Psychiatry.  Judgment and insight appears normal.  Data Reviewed: Prior data reviewed  Family Communication: Called mother with no response  Disposition: Status is: Inpatient Remains inpatient appropriate because: Severity of illness   Planned Discharge Destination: Home  Time spent: 50 minutes  This record has been created using Systems analyst. Errors have been sought and corrected,but may not always be located. Such creation errors do not reflect on the standard of care.  Author: Lorella Nimrod, MD 12/24/2021 4:04 PM  For on call review [email or web address].

## 2021-12-25 ENCOUNTER — Inpatient Hospital Stay: Payer: Self-pay

## 2021-12-25 DIAGNOSIS — K7031 Alcoholic cirrhosis of liver with ascites: Secondary | ICD-10-CM

## 2021-12-25 LAB — IRON AND TIBC
Iron: 67 ug/dL (ref 45–182)
Saturation Ratios: 27 % (ref 17.9–39.5)
TIBC: 246 ug/dL — ABNORMAL LOW (ref 250–450)
UIBC: 179 ug/dL

## 2021-12-25 LAB — COMPREHENSIVE METABOLIC PANEL
ALT: 15 U/L (ref 0–44)
AST: 90 U/L — ABNORMAL HIGH (ref 15–41)
Albumin: 2.5 g/dL — ABNORMAL LOW (ref 3.5–5.0)
Alkaline Phosphatase: 123 U/L (ref 38–126)
Anion gap: 10 (ref 5–15)
BUN: <5 mg/dL — ABNORMAL LOW (ref 6–20)
CO2: 26 mmol/L (ref 22–32)
Calcium: 6.7 mg/dL — ABNORMAL LOW (ref 8.9–10.3)
Chloride: 99 mmol/L (ref 98–111)
Creatinine, Ser: 0.44 mg/dL — ABNORMAL LOW (ref 0.61–1.24)
GFR, Estimated: >60 mL/min (ref >60)
Glucose, Bld: 75 mg/dL (ref 70–99)
Potassium: 3.2 mmol/L — ABNORMAL LOW (ref 3.5–5.1)
Sodium: 135 mmol/L (ref 135–145)
Total Bilirubin: 9.8 mg/dL — ABNORMAL HIGH (ref 0.3–1.2)
Total Protein: 7.3 g/dL (ref 6.5–8.1)

## 2021-12-25 LAB — PROTIME-INR
INR: 1.7 — ABNORMAL HIGH (ref 0.8–1.2)
Prothrombin Time: 19.7 seconds — ABNORMAL HIGH (ref 11.4–15.2)

## 2021-12-25 LAB — CBC
HCT: 35.8 % — ABNORMAL LOW (ref 39.0–52.0)
Hemoglobin: 12.4 g/dL — ABNORMAL LOW (ref 13.0–17.0)
MCH: 35 pg — ABNORMAL HIGH (ref 26.0–34.0)
MCHC: 34.6 g/dL (ref 30.0–36.0)
MCV: 101.1 fL — ABNORMAL HIGH (ref 80.0–100.0)
Platelets: 139 10*3/uL — ABNORMAL LOW (ref 150–400)
RBC: 3.54 MIL/uL — ABNORMAL LOW (ref 4.22–5.81)
RDW: 19.1 % — ABNORMAL HIGH (ref 11.5–15.5)
WBC: 9.5 10*3/uL (ref 4.0–10.5)
nRBC: 0.5 % — ABNORMAL HIGH (ref 0.0–0.2)

## 2021-12-25 LAB — AMMONIA: Ammonia: 70 umol/L — ABNORMAL HIGH (ref 9–35)

## 2021-12-25 LAB — FOLATE: Folate: 6.8 ng/mL (ref >5.9)

## 2021-12-25 LAB — RPR: RPR Ser Ql: NONREACTIVE

## 2021-12-25 LAB — MAGNESIUM: Magnesium: 1.6 mg/dL — ABNORMAL LOW (ref 1.7–2.4)

## 2021-12-25 LAB — FERRITIN: Ferritin: 144 ng/mL (ref 24–336)

## 2021-12-25 LAB — PHOSPHORUS: Phosphorus: 2.7 mg/dL (ref 2.5–4.6)

## 2021-12-25 LAB — LIPASE, BLOOD: Lipase: 26 U/L (ref 11–51)

## 2021-12-25 LAB — VITAMIN B12: Vitamin B-12: 527 pg/mL (ref 180–914)

## 2021-12-25 MED ORDER — LACTULOSE 10 GM/15ML PO SOLN
20.0000 g | Freq: Two times a day (BID) | ORAL | Status: DC
  Administered 2021-12-25: 20 g via ORAL
  Filled 2021-12-25: qty 30

## 2021-12-25 MED ORDER — LEVOFLOXACIN IN D5W 500 MG/100ML IV SOLN
500.0000 mg | INTRAVENOUS | Status: DC
  Administered 2021-12-25 – 2021-12-26 (×2): 500 mg via INTRAVENOUS
  Filled 2021-12-25 (×3): qty 100

## 2021-12-25 MED ORDER — MAGNESIUM SULFATE 2 GM/50ML IV SOLN
2.0000 g | Freq: Once | INTRAVENOUS | Status: AC
  Administered 2021-12-25: 2 g via INTRAVENOUS
  Filled 2021-12-25: qty 50

## 2021-12-25 MED ORDER — RIFAXIMIN 550 MG PO TABS
550.0000 mg | ORAL_TABLET | Freq: Two times a day (BID) | ORAL | Status: DC
  Administered 2021-12-25 – 2022-01-01 (×14): 550 mg via ORAL
  Filled 2021-12-25 (×14): qty 1

## 2021-12-25 MED ORDER — AMLODIPINE BESYLATE 5 MG PO TABS
5.0000 mg | ORAL_TABLET | Freq: Every day | ORAL | Status: DC
  Administered 2021-12-25 – 2022-01-01 (×8): 5 mg via ORAL
  Filled 2021-12-25 (×8): qty 1

## 2021-12-25 MED ORDER — POLYETHYLENE GLYCOL 3350 17 G PO PACK
17.0000 g | PACK | Freq: Every day | ORAL | Status: DC
  Administered 2021-12-25 – 2021-12-26 (×2): 17 g via ORAL
  Filled 2021-12-25 (×2): qty 1

## 2021-12-25 MED ORDER — CALCIUM CARBONATE 1250 (500 CA) MG PO TABS
2.0000 | ORAL_TABLET | Freq: Every day | ORAL | Status: AC
  Administered 2021-12-25 – 2021-12-27 (×3): 2500 mg via ORAL
  Filled 2021-12-25 (×3): qty 2

## 2021-12-25 MED ORDER — POTASSIUM CHLORIDE 10 MEQ/100ML IV SOLN
10.0000 meq | INTRAVENOUS | Status: AC
  Administered 2021-12-25 (×2): 10 meq via INTRAVENOUS
  Filled 2021-12-25 (×2): qty 100

## 2021-12-25 MED ORDER — FUROSEMIDE 20 MG PO TABS
20.0000 mg | ORAL_TABLET | Freq: Every day | ORAL | Status: DC
  Administered 2021-12-25 – 2021-12-26 (×2): 20 mg via ORAL
  Filled 2021-12-25 (×2): qty 1

## 2021-12-25 MED ORDER — PANTOPRAZOLE SODIUM 40 MG PO TBEC
40.0000 mg | DELAYED_RELEASE_TABLET | Freq: Every day | ORAL | Status: DC
  Administered 2021-12-25 – 2022-01-01 (×8): 40 mg via ORAL
  Filled 2021-12-25 (×8): qty 1

## 2021-12-25 MED ORDER — POTASSIUM CHLORIDE CRYS ER 20 MEQ PO TBCR
40.0000 meq | EXTENDED_RELEASE_TABLET | Freq: Once | ORAL | Status: AC
  Administered 2021-12-25: 40 meq via ORAL
  Filled 2021-12-25: qty 2

## 2021-12-25 MED ORDER — SPIRONOLACTONE 25 MG PO TABS
50.0000 mg | ORAL_TABLET | Freq: Every day | ORAL | Status: DC
  Administered 2021-12-25 – 2021-12-26 (×2): 50 mg via ORAL
  Filled 2021-12-25 (×2): qty 2

## 2021-12-25 NOTE — Progress Notes (Signed)
PHARMACIST - PHYSICIAN COMMUNICATION  DR:   Reesa Chew  CONCERNING: IV to Oral Route Change Policy  RECOMMENDATION: This patient is receiving Pantoprazole by the intravenous route.  Based on criteria approved by the Pharmacy and Therapeutics Committee, the intravenous medication(s) is/are being converted to the equivalent oral dose form(s).   DESCRIPTION: These criteria include: The patient is eating (either orally or via tube) and/or has been taking other orally administered medications for a least 24 hours The patient has no evidence of active gastrointestinal bleeding or impaired GI absorption (gastrectomy, short bowel, patient on TNA or NPO).  If you have questions about this conversion, please contact the Pharmacy Department  []   [phone] )  [facility] [x]   [phone] )  [facility] []   [phone] )  [facility] []   [phone] )  [facility] []   [phone] )  [place] Rodriguez-Guzman PharmD, BCPS 12/25/2021 8:16 AM

## 2021-12-25 NOTE — Assessment & Plan Note (Addendum)
Phosphorous of 2.5, improved. -Replace phosphorus as needed and monitor

## 2021-12-25 NOTE — Progress Notes (Signed)
Progress Note   Patient: Cameron Santiago GQQ:[id number] DOB: Nov 01, 1995 DOA: 12/22/2021     3 DOS: the patient was seen and examined on 12/25/2021   Brief hospital course: Taken from prior notes.   26 years old male with PMH significant for alcohol abuse, alcoholic liver cirrhosis, GERD who presents to the ER for evaluation of abdominal pain as well as testicular pain. Patient states symptoms started 3 days ago and is associated with nausea and vomiting.He complains of lower testicular pain but denies having any penile discharge.  He is currently not sexually active.  Work-up in the ED showed hypokalemia, hypomagnesemia GC and chlamydia cultures negative.  CT abdomen showed morphological features of liver cirrhosis, including hepatomegaly, irregular liver contour and severe hepatic steatosis.  Testicular ultrasound showed finding consistent with acute epididymitis.  Patient is admitted for further evaluation.  He is started on ceftriaxone and doxycycline.  Urology was consulted, recommended to continue antibiotics, adequate pain control, scrotal support, compressive underwear, cryotherapy as needed.  11/1: Patient continued to have significant pain.  Multiple electrolyte abnormalities which include hypomagnesemia, hypophosphatemia and hypokalemia which are being repleted. Gonorrhea and chlamydia PCR negative, HIV negative.  Ordered RPR. Patient also has paracentesis done yesterday with removal of only 1 mL of fluid, apparently there was no significant ascites.  Preliminary cultures negative. CIWA score of 4.  Counseling was provided for alcohol cessation.  11/2: Patient had another spike of fever up to 101.5 overnight.,Antibiotics switched to Levaquin. Blood pressure remained elevated-restarting home amlodipine. Potassium and corrected calcium remained low-being repleted. Worsening T. bili at 9.8 and INR 1.7-GI was consulted. Multiple labs to start the work-up of this new diagnosis of liver  cirrhosis especially at this age was ordered by GI. Ordered RUQ [place] for worsening abdominal distension.   Assessment and Plan: * Acute epididymitis Patient presents for evaluation of left testicular pain Testicle ultrasound is concerning for acute epididymitis We will place patient empirically on Rocephin and doxycycline Consult urology-recommending continuation of antibiotics, scrotal support, and cryotherapy as needed. HIV, chlamydia and gonorrhea PCR negative. Had another febrile episode -Antibiotics switched to Levaquin -Continue with supportive care  Alcoholic cirrhosis of liver with ascites (HCC) Patient with a history of alcohol abuse who presents for evaluation of diffuse abdominal pain associated with nausea and vomiting. CT scan of abdomen and pelvis shows morphologic features of the liver concerning for cirrhosis, including: marked hepatomegaly, irregular liver contour and severe hepatic steatosis with stigmata of portal venous hypertension noted including varices, splenomegaly and ascites which appears to be new. Paracentesis was ordered with removal of only 1 mL of fluid, apparently there was not much fluid present-preliminary cultures negative. -Patient was counseled for alcohol cessation. -GI was consulted today due to worsening T. bili and INR-ordered multiple labs to rule out other possible etiology for liver dysfunction -Also ordered another RUQ ultrasound due to worsening abdominal pain and distention -Also starting on lactulose due to concern of constipation -Need to follow-up at liver center in a tertiary care setting.  Alcohol dependence with withdrawal (HCC) Patient admits to daily alcohol use with symptoms of alcohol withdrawal when he does not drink CIWA score of 4 -Continue with CIWA protocol -Continue with supportive care and supplements  Hypokalemia Potassium at 3.2. -Monitor potassium and replete as needed  Hypomagnesemia Magnesium 1.6. -Replace  magnesium and monitor  Hypophosphatemia Phosphorous of 1.6. -Replace phosphorus and monitor  GERD (gastroesophageal reflux disease) Place patient on IV PPI  Obesity (BMI 30-39.9) Estimated body mass index  is 37.63 kg/m as calculated from the following:   Height as of this encounter: 5\' 11"  (1.803 m).   Weight as of this encounter: 122.4 kg.   -Encouraged weight loss -Will complicate overall prognosis   Subjective: Patient continued to have significant abdominal pain and distention.  Had a small bowel movement today.  Physical Exam: Vitals:   12/25/21 0300 12/25/21 0344 12/25/21 0737 12/25/21 1110  BP:  (!) 162/93 (!) 148/95 (!) 154/87  Pulse: 96 95 100 (!) 104  Resp: 20 20 18 18   Temp:  98.3 F (36.8 C) 99.1 F (37.3 C) 98.3 F (36.8 C)  TempSrc:   Oral Oral  SpO2:  92% 96% 95%  Weight:      Height:       General.  Obese gentleman, in no acute distress. Pulmonary.  Lungs clear bilaterally, normal respiratory effort. CV.  Regular rate and rhythm, no JVD, rub or murmur. Abdomen.  Soft, diffuse tenderness, distended, BS positive. CNS.  Alert and oriented .  No focal neurologic deficit. Extremities.  No edema, no cyanosis, pulses intact and symmetrical. Psychiatry.  Judgment and insight appears normal.   Data Reviewed: Prior data reviewed  Family Communication: Talked with mother on phone.  Disposition: Status is: Inpatient Remains inpatient appropriate because: Severity of illness   Planned Discharge Destination: Home  Time spent: 50 minutes  This record has been created using Systems analyst. Errors have been sought and corrected,but may not always be located. Such creation errors do not reflect on the standard of care.  Author: Lorella Nimrod, MD 12/25/2021 4:12 PM  For on call review [email or web address]

## 2021-12-25 NOTE — Plan of Care (Signed)

## 2021-12-25 NOTE — Consult Note (Signed)
Cephas Darby, MD [address]  [place]  [place], [place] [address]  Main: [phone]  Fax: [phone] Pager: [phone]   Consultation  Referring Provider:     No ref. provider found Primary Care Physician:  Pcp, No Primary Gastroenterologist: Althia Forts       Reason for Consultation: Cirrhosis of liver  Date of Admission:  12/22/2021 Date of Consultation:  12/25/2021         HPI:   Cameron Santiago is a 26 y.o. male history of alcohol abuse is admitted with abdominal pain, distention and testicular pain.  He is diagnosed with acute epididymitis.  Imaging revealed cirrhosis of liver with splenomegaly, ascites and stigmata of portal hypertension.  Patient underwent paracentesis on 10/31 with 1 L of fluid removed.  He is currently being treated for acute epididymitis with levofloxacin.  Patient reports that he has been consuming large amounts of alcohol since age of 41.  His last alcohol intake was 3 days ago before coming to the hospital.  Patient reports abdominal discomfort secondary to worsening of abdominal distention.  He does have swelling of legs as well.  He is able to tolerate p.o. patient denies any rectal bleeding, hematemesis or coffee-ground emesis, black tarry stools   NSAIDs: None  Antiplts/Anticoagulants/Anti thrombotics: None  GI Procedures: None  Past Medical History:  Diagnosis Date   Alcohol abuse    Alcoholic pancreatitis    GERD (gastroesophageal reflux disease)     Past Surgical History:  Procedure Laterality Date   APPENDECTOMY     LAPAROSCOPIC APPENDECTOMY N/A 02/15/2015   Procedure: APPENDECTOMY LAPAROSCOPIC;  Surgeon: Sherri Rad, MD;  Location: [facility] ORS;  Service: General;  Laterality: N/A;   TONSILLECTOMY       Current Facility-Administered Medications:    0.9 %  sodium chloride infusion, , Intravenous, Continuous, Agbata, Tochukwu, MD, Last Rate: 100 mL/hr at 12/25/21 0052, New Bag at 12/25/21 0052   albuterol (PROVENTIL)  (2.5 MG/3ML) 0.083% nebulizer solution 2.5 mg, 2.5 mg, Inhalation, Q6H PRN, Agbata, Tochukwu, MD   amLODipine (NORVASC) tablet 5 mg, 5 mg, Oral, Daily, Amin, Soundra Pilon, MD, 5 mg at 12/25/21 6659   calcium carbonate (OS-CAL - dosed in mg of elemental calcium) tablet 2,500 mg, 2 tablet, Oral, Q breakfast, Amin, Soundra Pilon, MD, 2,500 mg at 12/25/21 0956   enoxaparin (LOVENOX) injection 57.5 mg, 0.5 mg/kg, Subcutaneous, Q24H, Agbata, Tochukwu, MD, 57.5 mg at 93/57/01 7793   folic acid (FOLVITE) tablet 1 mg, 1 mg, Oral, Daily, Agbata, Tochukwu, MD, 1 mg at 12/25/21 0952   levofloxacin (LEVAQUIN) IVPB 500 mg, 500 mg, Intravenous, Q24H, Amin, Sumayya, MD, Last Rate: 100 mL/hr at 12/25/21 1207, 500 mg at 12/25/21 1207   [EXPIRED] LORazepam (ATIVAN) tablet 0-4 mg, 0-4 mg, Oral, Q6H, 1 mg at 12/23/21 1636 **FOLLOWED BY** LORazepam (ATIVAN) tablet 0-4 mg, 0-4 mg, Oral, Q12H, Agbata, Tochukwu, MD, 2 mg at 12/24/21 2209   morphine (PF) 2 MG/ML injection 2 mg, 2 mg, Intravenous, Q4H PRN, Agbata, Tochukwu, MD, 2 mg at 12/25/21 1352   multivitamin with minerals tablet 1 tablet, 1 tablet, Oral, Daily, Agbata, Tochukwu, MD, 1 tablet at 12/25/21 0952   naloxone (NARCAN) injection 0.4 mg, 0.4 mg, Intravenous, PRN, Foust, Katy L, NP   ondansetron (ZOFRAN) tablet 4 mg, 4 mg, Oral, Q6H PRN, 4 mg at 12/23/21 1635 **OR** ondansetron (ZOFRAN) injection 4 mg, 4 mg, Intravenous, Q6H PRN, Agbata, Tochukwu, MD, 4 mg at 12/25/21 0954   pantoprazole (PROTONIX) EC tablet  40 mg, 40 mg, Oral, Daily, Amin, Soundra Pilon, MD, 40 mg at 12/25/21 2202   polyethylene glycol (MIRALAX / GLYCOLAX) packet 17 g, 17 g, Oral, Daily, Natalija Mavis, Tally Due, MD   rifaximin Doreene Nest) tablet 550 mg, 550 mg, Oral, BID, Laykin Rainone, Tally Due, MD   thiamine (VITAMIN B1) tablet 100 mg, 100 mg, Oral, Daily, 100 mg at 12/25/21 5427 **OR** thiamine (VITAMIN B1) injection 100 mg, 100 mg, Intravenous, Daily, Agbata, Tochukwu, MD   Family History  Problem Relation Age of  Onset   Arthritis Mother      Social History   Tobacco Use   Smoking status: Never   Smokeless tobacco: Never  Substance Use Topics   Alcohol use: Yes   Drug use: Never    Allergies as of 12/21/2021   (No Known Allergies)    Review of Systems:    All systems reviewed and negative except where noted in HPI.   Physical Exam:  Vital signs in last 24 hours: Temp:  [98.2 F (36.8 C)-101.5 F (38.6 C)] 98.9 F (37.2 C) (11/02 1621) Pulse Rate:  [95-113] 103 (11/02 1621) Resp:  [18-30] 18 (11/02 1621) BP: (131-164)/(76-96) 137/76 (11/02 1621) SpO2:  [81 %-96 %] 96 % (11/02 1621) Last BM Date : 12/24/21 General:   Pleasant, cooperative in NAD Head:  Normocephalic and atraumatic. Eyes: Positive icterus.   Conjunctiva pink. PERRLA. Ears:  Normal auditory acuity. Neck:  Supple; no masses or thyroidomegaly Lungs: Respirations even and unlabored. Lungs clear to auscultation bilaterally.   No wheezes, crackles, or rhonchi.  Heart:  Regular rate and rhythm;  Without murmur, clicks, rubs or gallops Abdomen:  Soft, severely distended, tympanic to percussion, dull in bilateral flanks, nontender. Normal bowel sounds. No appreciable masses or hepatomegaly.  No rebound or guarding.  Rectal:  Not performed. Msk:  Symmetrical without gross deformities.  Strength generalized weakness Extremities: 3+ edema,no cyanosis or clubbing. Neurologic:  Alert and oriented x3;  grossly normal neurologically. Skin:  Intact without significant lesions or rashes. Psych:  Alert and cooperative. Normal affect.  LAB RESULTS:    Latest Ref Rng & Units 12/25/2021    5:40 AM 12/24/2021    6:25 AM 12/23/2021    2:42 AM  CBC  WBC 4.0 - 10.5 K/uL 9.5  10.9  12.0   Hemoglobin 13.0 - 17.0 g/dL 12.4  12.4  12.2   Hematocrit 39.0 - 52.0 % 35.8  35.3  35.1   Platelets 150 - 400 K/uL 139  147  180     BMET    Latest Ref Rng & Units 12/25/2021    5:40 AM 12/23/2021    2:42 AM 12/21/2021   11:53 PM  BMP   Glucose 70 - 99 mg/dL 75  94  158   BUN 6 - 20 mg/dL <5  <5  <5   Creatinine 0.61 - 1.24 mg/dL 0.44  <0.30  0.40   Sodium 135 - 145 mmol/L 135  136  134   Potassium 3.5 - 5.1 mmol/L 3.2  3.7  2.6   Chloride 98 - 111 mmol/L 99  99  93   CO2 22 - 32 mmol/L _0 Calcium 8.9 - 10.3 mg/dL 6.7  7.0  7.8     LFT    Latest Ref Rng & Units 12/25/2021    5:40 AM 12/21/2021   11:53 PM 10/08/2021    1:56 PM  Hepatic Function  Total Protein 6.5 - 8.1 g/dL 7.3  8.8  8.7   Albumin 3.5 - 5.0 g/dL 2.5  3.0  4.1   AST 15 - 41 U/L 90  150  127   ALT 0 - 44 U/L 15  24  58   Alk Phosphatase 38 - 126 U/L 123  181  137   Total Bilirubin 0.3 - 1.2 mg/dL 9.8  6.4  2.1      STUDIES: No results found.    Impression / Plan:   Cameron Santiago is a 26 y.o. male with alcohol abuse, history of alcoholic pancreatitis, s/p appendectomy is admitted with decompensated cirrhosis of liver and acute epididymitis  Decompensated cirrhosis of liver, most likely secondary to alcohol abuse MELD sodium 23, child class C, CPT-11 Given young age, recommend secondary liver disease work-up, labs ordered Volume overload with ascites and swelling of legs, s/p paracentesis, no evidence of SBP SAAG> 1.1 consistent with portal hypertension, strict low-sodium diet Recommend to start low-dose diuretics Lasix 20 mg and spironolactone 50 mg daily, titrate according to his renal function and electrolytes No evidence of PVT based on CT abdomen pelvis with IV contrast Nonurgent EGD for variceal screening No evidence of liver lesions Patient reports gaseous abdominal distention, likely secondary to lactulose use, recommend to switch to MiraLAX and start Xifaxan 550 mg twice daily HRS: none PSE: none He will need complete abstinence from alcohol use, otherwise prognosis is guarded We will hold off on prednisone use as patient is currently being treated for acute epididymitis   Thank you for involving me in the care of  this patient.  GI will follow along with you    LOS: 3 days   Sherri Sear, MD  12/25/2021, 5:28 PM    Note: This dictation was prepared with Dragon dictation along with smaller phrase technology. Any transcriptional errors that result from this process are unintentional.

## 2021-12-25 NOTE — Assessment & Plan Note (Addendum)
Potassium at 3.8. -Monitor potassium and replete as needed

## 2021-12-25 NOTE — Assessment & Plan Note (Addendum)
Worsening function with worsening T. bili and INR Patient with a history of alcohol abuse who presents for evaluation of diffuse abdominal pain associated with nausea and vomiting. CT scan of abdomen and pelvis shows morphologic features of the liver concerning for cirrhosis, including: marked hepatomegaly, irregular liver contour and severe hepatic steatosis with stigmata of portal venous hypertension noted including varices, splenomegaly and ascites which appears to be new. Paracentesis was ordered with removal of only 1 mL of fluid, apparently there was not much fluid present-preliminary cultures negative. -Patient was counseled for alcohol cessation. -GI was consulted today due to worsening T. bili and INR-ordered multiple labs to rule out other possible etiology for liver dysfunction, mostly negative so far and few pending. -Right upper quadrant repeat ultrasound with trace ascites on the -Started on low-dose Lasix, rifaximin and spironolactone -Lactulose switched with MiraLAX due to concern of bloating -Liver MRI -Need to follow-up at liver center in a tertiary care setting.

## 2021-12-25 NOTE — Assessment & Plan Note (Addendum)
Patient presents for evaluation of left testicular pain Testicle ultrasound is concerning for acute epididymitis We will place patient empirically on Rocephin and doxycycline Consult urology-recommending continuation of antibiotics, scrotal support, and cryotherapy as needed. HIV, chlamydia and gonorrhea PCR negative. Another fever spike despite changing of antibiotics to Levaquin Urine and blood cultures to rule out any resistant bacteria -Continue with Levaquin -Continue with supportive care

## 2021-12-25 NOTE — Consult Note (Signed)
PHARMACY CONSULT NOTE - FOLLOW UP  Pharmacy Consult for Electrolyte Monitoring and Replacement   Recent Labs: Potassium (mmol/L)  Date Value  12/25/2021 3.2 (L)   Magnesium (mg/dL)  Date Value  12/25/2021 1.6 (L)   Calcium (mg/dL)  Date Value  12/25/2021 6.7 (L)   Albumin (g/dL)  Date Value  12/25/2021 2.5 (L)   Phosphorus (mg/dL)  Date Value  12/25/2021 2.7   Sodium (mmol/L)  Date Value  12/25/2021 135    Assessment: 26 yo male admitted for treatment of acute epididymitis. PMH includes alcohol abuse, liver cirrhosis, and GERD. Pharmacy consulted to manage electrolytes.  Goal of Therapy:  Electrolytes WNL  Plan:  Mg remains at 1.6 today, will replace with MgSulfate 2gm IVPB x 1 dose. K 3.2 today will replace with Kcl 61meq IVPB x 2 plus Kcl 40 meq po x 1. Corrected calcium 8.2, will replace with calcium carbonate 1000mg  of elemental calcium daily x 3 days. Phos 2.7, no additional replacement needed. Plan to follow AM labs and replace electrolytes as needed.  Conner Neiss Rodriguez-Guzman PharmD, BCPS 12/25/2021 7:37 AM

## 2021-12-26 DIAGNOSIS — R17 Unspecified jaundice: Secondary | ICD-10-CM

## 2021-12-26 DIAGNOSIS — R7989 Other specified abnormal findings of blood chemistry: Secondary | ICD-10-CM

## 2021-12-26 LAB — PROTIME-INR
INR: 1.7 — ABNORMAL HIGH (ref 0.8–1.2)
Prothrombin Time: 19.6 seconds — ABNORMAL HIGH (ref 11.4–15.2)

## 2021-12-26 LAB — COMPREHENSIVE METABOLIC PANEL
ALT: 17 U/L (ref 0–44)
AST: 76 U/L — ABNORMAL HIGH (ref 15–41)
Albumin: 2.4 g/dL — ABNORMAL LOW (ref 3.5–5.0)
Alkaline Phosphatase: 111 U/L (ref 38–126)
Anion gap: 10 (ref 5–15)
BUN: <5 mg/dL — ABNORMAL LOW (ref 6–20)
CO2: 26 mmol/L (ref 22–32)
Calcium: 6.9 mg/dL — ABNORMAL LOW (ref 8.9–10.3)
Chloride: 99 mmol/L (ref 98–111)
Creatinine, Ser: 0.37 mg/dL — ABNORMAL LOW (ref 0.61–1.24)
GFR, Estimated: >60 mL/min (ref >60)
Glucose, Bld: 102 mg/dL — ABNORMAL HIGH (ref 70–99)
Potassium: 3 mmol/L — ABNORMAL LOW (ref 3.5–5.1)
Sodium: 135 mmol/L (ref 135–145)
Total Bilirubin: 10.1 mg/dL — ABNORMAL HIGH (ref 0.3–1.2)
Total Protein: 7.1 g/dL (ref 6.5–8.1)

## 2021-12-26 LAB — CBC
HCT: 33.9 % — ABNORMAL LOW (ref 39.0–52.0)
Hemoglobin: 11.9 g/dL — ABNORMAL LOW (ref 13.0–17.0)
MCH: 34.9 pg — ABNORMAL HIGH (ref 26.0–34.0)
MCHC: 35.1 g/dL (ref 30.0–36.0)
MCV: 99.4 fL (ref 80.0–100.0)
Platelets: 147 10*3/uL — ABNORMAL LOW (ref 150–400)
RBC: 3.41 MIL/uL — ABNORMAL LOW (ref 4.22–5.81)
RDW: 18.9 % — ABNORMAL HIGH (ref 11.5–15.5)
WBC: 9.2 10*3/uL (ref 4.0–10.5)
nRBC: 0.5 % — ABNORMAL HIGH (ref 0.0–0.2)

## 2021-12-26 LAB — ANTI-MICROSOMAL ANTIBODY LIVER / KIDNEY: LKM1 Ab: 1.1 Units (ref 0.0–20.0)

## 2021-12-26 LAB — ANTI-SMOOTH MUSCLE ANTIBODY, IGG: F-Actin IgG: 17 Units (ref 0–19)

## 2021-12-26 LAB — MITOCHONDRIAL ANTIBODIES: Mitochondrial M2 Ab, IgG: <20 Units (ref 0.0–20.0)

## 2021-12-26 LAB — ANA W/REFLEX IF POSITIVE: Anti Nuclear Antibody (ANA): NEGATIVE

## 2021-12-26 MED ORDER — POLYETHYLENE GLYCOL 3350 17 G PO PACK
34.0000 g | PACK | Freq: Every day | ORAL | Status: DC
  Administered 2021-12-27 – 2021-12-31 (×5): 34 g via ORAL
  Filled 2021-12-26 (×6): qty 2

## 2021-12-26 MED ORDER — FUROSEMIDE 40 MG PO TABS
40.0000 mg | ORAL_TABLET | Freq: Every day | ORAL | Status: DC
  Administered 2021-12-27 – 2022-01-01 (×6): 40 mg via ORAL
  Filled 2021-12-26 (×6): qty 1

## 2021-12-26 MED ORDER — SPIRONOLACTONE 25 MG PO TABS
100.0000 mg | ORAL_TABLET | Freq: Every day | ORAL | Status: DC
  Administered 2021-12-27 – 2022-01-01 (×6): 100 mg via ORAL
  Filled 2021-12-26 (×6): qty 4

## 2021-12-26 MED ORDER — POTASSIUM CHLORIDE CRYS ER 20 MEQ PO TBCR
40.0000 meq | EXTENDED_RELEASE_TABLET | ORAL | Status: AC
  Administered 2021-12-26 (×3): 40 meq via ORAL
  Filled 2021-12-26 (×3): qty 2

## 2021-12-26 NOTE — Plan of Care (Signed)

## 2021-12-26 NOTE — Progress Notes (Signed)
[name], MD [address]  [address]  [place], [place] [address]  Main: [phone]  Fax: [phone] Pager: [phone]   Subjective: Patient had a temp of 100.7 overnight. Patient denies any abdominal pain, nausea or vomiting   Objective: Vital signs in last 24 hours: Vitals:   12/26/21 0234 12/26/21 0359 12/26/21 0745 12/26/21 1159  BP: (!) 154/89 (!) 147/89 (!) 158/90 (!) 155/98  Pulse: (!) 105 99 (!) 108 (!) 109  Resp: 20 18 20 20   Temp: (!) 100.5 F (38.1 C) 98.2 F (36.8 C) 98.5 F (36.9 C) 98.4 F (36.9 C)  TempSrc:      SpO2: 94% 97% 97% 92%  Weight:      Height:       Weight change:   Intake/Output Summary (Last 24 hours) at 12/26/2021 1638 Last data filed at 12/26/2021 1100 Gross per 24 hour  Intake --  Output 1000 ml  Net -1000 ml     Exam: Heart:: Regular rate and rhythm, S1S2 present, or without murmur or extra heart sounds Lungs: normal and clear to auscultation Abdomen: Soft, nontender, diffusely distended, tympanic to percussion Extremities: 2+ edema   Lab Results:    Latest Ref Rng & Units 12/26/2021    5:29 AM 12/25/2021    5:40 AM 12/24/2021    6:25 AM  CBC  WBC 4.0 - 10.5 K/uL 9.2  9.5  10.9   Hemoglobin 13.0 - 17.0 g/dL 13/02/2021  18.2  99.3   Hematocrit 39.0 - 52.0 % 33.9  35.8  35.3   Platelets 150 - 400 K/uL 147  139  147       Latest Ref Rng & Units 12/26/2021    5:29 AM 12/25/2021    5:40 AM 12/23/2021    2:42 AM  CMP  Glucose 70 - 99 mg/dL 12/25/2021  75  94   BUN 6 - 20 mg/dL <5  <5  <5   Creatinine 0.61 - 1.24 mg/dL 967  8.93  8.10   Sodium 135 - 145 mmol/L 135  135  136   Potassium 3.5 - 5.1 mmol/L 3.0  3.2  3.7   Chloride 98 - 111 mmol/L 99  99  99   CO2 22 - 32 mmol/L 26  26  28    Calcium 8.9 - 10.3 mg/dL 6.9  6.7  7.0   Total Protein 6.5 - 8.1 g/dL 7.1  7.3    Total Bilirubin 0.3 - 1.2 mg/dL <1.75  9.8    Alkaline Phos 38 - 126 U/L 111  123    AST 15 - 41 U/L 76  90    ALT 0 - 44 U/L 17  15      Micro  Results: Recent Results (from the past 240 hour(s))  Chlamydia/NGC rt PCR ([facility] only)     Status: None   Collection Time: 12/21/21 11:53 PM   Specimen: Urine  Result Value Ref Range Status   Specimen source GC/Chlam CHLAMYDIA SPECIES  Final   Chlamydia Tr NOT DETECTED NOT DETECTED Final   N gonorrhoeae NOT DETECTED NOT DETECTED Final    Comment: (NOTE) This CT/NG assay has not been evaluated in patients with a history of  hysterectomy. Performed at [facility], [address] [place], [address] [place]   Body fluid culture w Gram Stain     Status: None (Preliminary result)   Collection Time: 12/23/21  2:25 PM   Specimen: PATH Cytology Peritoneal fluid  Result  Value Ref Range Status   Specimen Description   Final    PERITONEAL Performed at [facility], [address] [place], [place] [address]    Special Requests   Final    NONE Performed at [facility], [address] [place], [place] [address]    Gram Stain   Final    RARE WBC PRESENT, PREDOMINANTLY MONONUCLEAR NO ORGANISMS SEEN    Culture   Final    NO GROWTH 3 DAYS Performed at [facility], [address] [place], [place] [address]    Report Status PENDING  Incomplete   Studies/Results: [place] ASCITES (ABDOMEN LIMITED)  Result Date: 12/25/2021 CLINICAL DATA:  Ascites EXAM: LIMITED ABDOMEN ULTRASOUND FOR ASCITES TECHNIQUE: Limited ultrasound survey for ascites was performed in all four abdominal quadrants. COMPARISON:  12/23/2021 FINDINGS: There is trace amount of ascites in left upper quadrant of abdomen. There is significant interval decrease in amount of ascites. IMPRESSION: Trace amount of residual ascites is seen in left upper quadrant. Electronically Signed   By: Ernie Avena M.D.   On: 12/25/2021 17:50   Medications: I have reviewed the patient's current medications. Prior to Admission:  Medications Prior to Admission  Medication Sig Dispense Refill Last Dose    albuterol (VENTOLIN HFA) 108 (90 Base) MCG/ACT inhaler Inhale 2 puffs into the lungs every 6 (six) hours as needed for wheezing or shortness of breath. 8 g 2 prn   amLODipine (NORVASC) 5 MG tablet Take 1 tablet (5 mg total) by mouth daily. 30 tablet 0    dicyclomine (BENTYL) 10 MG capsule Take 1 capsule (10 mg total) by mouth 3 (three) times daily as needed (abd pain). (Patient not taking: Reported on 12/22/2021) 20 capsule 0 Not Taking   famotidine (PEPCID) 20 MG tablet Take 1 tablet (20 mg total) by mouth 2 (two) times daily. (Patient not taking: Reported on 12/22/2021) 60 tablet 0 Not Taking   ondansetron (ZOFRAN-ODT) 4 MG disintegrating tablet Take 1 tablet (4 mg total) by mouth every 8 (eight) hours as needed for nausea or vomiting. (Patient not taking: Reported on 07/10/2021) 20 tablet 0 Not Taking   sucralfate (CARAFATE) 1 g tablet Take 1 tablet (1 g total) by mouth 4 (four) times daily. (Patient not taking: Reported on 12/22/2021) 20 tablet 0 Not Taking   Scheduled:  amLODipine  5 mg Oral Daily   calcium carbonate  2 tablet Oral Q breakfast   enoxaparin (LOVENOX) injection  0.5 mg/kg Subcutaneous Q24H   folic acid  1 mg Oral Daily   [START ON 12/27/2021] furosemide  40 mg Oral Daily   multivitamin with minerals  1 tablet Oral Daily   pantoprazole  40 mg Oral Daily   [START ON 12/27/2021] polyethylene glycol  34 g Oral Daily   potassium chloride  40 mEq Oral Q4H   rifaximin  550 mg Oral BID   [START ON 12/27/2021] spironolactone  100 mg Oral Daily   thiamine  100 mg Oral Daily   Or   thiamine  100 mg Intravenous Daily   Continuous:  levofloxacin (LEVAQUIN) IV 500 mg (12/26/21 1100)   ELF:[id number], morphine injection, naLOXone (NARCAN)  injection, ondansetron **OR** ondansetron (ZOFRAN) IV Anti-infectives (From admission, onward)    Start     Dose/Rate Route Frequency Ordered Stop   12/25/21 2200  rifaximin (XIFAXAN) tablet 550 mg        550 mg Oral 2 times daily 12/25/21 1731      12/25/21 1000  levofloxacin (LEVAQUIN) IVPB 500 mg        500 mg 100 mL/hr over 60 Minutes Intravenous Every 24 hours 12/25/21 0834     12/23/21 0000  cefTRIAXone (ROCEPHIN) 2 g in sodium chloride 0.9 % 100 mL IVPB        2 g 200 mL/hr over 30 Minutes Intravenous  Once 12/22/21 1031 12/23/21 0423   12/22/21 2300  doxycycline (VIBRAMYCIN) 100 mg in sodium chloride 0.9 % 250 mL IVPB  Status:  Discontinued        100 mg 125 mL/hr over 120 Minutes Intravenous Every 12 hours 12/22/21 1031 12/25/21 0833   12/22/21 1000  doxycycline (VIBRAMYCIN) 100 mg in sodium chloride 0.9 % 250 mL IVPB        100 mg 125 mL/hr over 120 Minutes Intravenous  Once 12/22/21 0949 12/22/21 1540   12/22/21 0930  levofloxacin (LEVAQUIN) IVPB 500 mg  Status:  Discontinued        500 mg 100 mL/hr over 60 Minutes Intravenous  Once 12/22/21 0924 12/22/21 0949   12/22/21 0730  cefTRIAXone (ROCEPHIN) 2 g in sodium chloride 0.9 % 100 mL IVPB        2 g 200 mL/hr over 30 Minutes Intravenous  Once 12/22/21 0724 12/22/21 9381      Scheduled Meds:  amLODipine  5 mg Oral Daily   calcium carbonate  2 tablet Oral Q breakfast   enoxaparin (LOVENOX) injection  0.5 mg/kg Subcutaneous W29H   folic acid  1 mg Oral Daily   [START ON 12/27/2021] furosemide  40 mg Oral Daily   multivitamin with minerals  1 tablet Oral Daily   pantoprazole  40 mg Oral Daily   [START ON 12/27/2021] polyethylene glycol  34 g Oral Daily   potassium chloride  40 mEq Oral Q4H   rifaximin  550 mg Oral BID   [START ON 12/27/2021] spironolactone  100 mg Oral Daily   thiamine  100 mg Oral Daily   Or   thiamine  100 mg Intravenous Daily   Continuous Infusions:  levofloxacin (LEVAQUIN) IV 500 mg (12/26/21 1100)   PRN Meds:.albuterol, morphine injection, naLOXone (NARCAN)  injection, ondansetron **OR** ondansetron (ZOFRAN) IV   Assessment: Principal Problem:   Acute epididymitis Active Problems:   Alcohol dependence with withdrawal (HCC)   GERD  (gastroesophageal reflux disease)   Hypokalemia   Hypomagnesemia   Hypophosphatemia   Alcoholic cirrhosis of liver with ascites (HCC)   Obesity (BMI 30-39.9)  AWS Cameron Santiago is a 26 y.o. male with alcohol abuse, history of alcoholic pancreatitis, s/p appendectomy is admitted with decompensated cirrhosis of liver and acute epididymitis   Plan:  Decompensated cirrhosis of liver, most likely secondary to alcohol abuse MELD sodium 23, child class C, CPT-11 Given young age, recommend secondary liver disease work-up, labs ordered, so far negative work-up Volume overload with ascites and swelling of legs, s/p paracentesis, no evidence of SBP SAAG> 1.1 consistent with portal hypertension, strict low-sodium diet Recommend to increase Lasix to 40 mg a spironolactone 100 mg dail, monitor his renal function and electrolytes No evidence of PVT based on CT abdomen pelvis with IV contrast Nonurgent EGD for variceal screening No evidence of liver lesions Continue MiraLAX 34 g daily and Xifaxan 550 mg twice daily HRS: none PSE: none He will need complete abstinence from alcohol use, otherwise prognosis is guarded We will hold off on prednisone use as patient is currently being treated for acute epididymitis  Acute epididymitis with fever Currently  on Levaquin No evidence of SBP Urine culture and blood cultures are in process     Thank you for involving me in the care of this patient.  GI will follow along with you       LOS: 4 days   Christohper Dube 12/26/2021, 4:38 PM

## 2021-12-26 NOTE — Progress Notes (Signed)
Progress Note   Patient: Cameron Santiago OFB:[id number] DOB: 05-18-1995 DOA: 12/22/2021     4 DOS: the patient was seen and examined on 12/26/2021   Brief hospital course: Taken from prior notes.   26 years old male with PMH significant for alcohol abuse, alcoholic liver cirrhosis, GERD who presents to the ER for evaluation of abdominal pain as well as testicular pain. Patient states symptoms started 3 days ago and is associated with nausea and vomiting.He complains of lower testicular pain but denies having any penile discharge.  He is currently not sexually active.  Work-up in the ED showed hypokalemia, hypomagnesemia GC and chlamydia cultures negative.  CT abdomen showed morphological features of liver cirrhosis, including hepatomegaly, irregular liver contour and severe hepatic steatosis.  Testicular ultrasound showed finding consistent with acute epididymitis.  Patient is admitted for further evaluation.  He is started on ceftriaxone and doxycycline.  Urology was consulted, recommended to continue antibiotics, adequate pain control, scrotal support, compressive underwear, cryotherapy as needed.  11/1: Patient continued to have significant pain.  Multiple electrolyte abnormalities which include hypomagnesemia, hypophosphatemia and hypokalemia which are being repleted. Gonorrhea and chlamydia PCR negative, HIV negative.  Ordered RPR. Patient also has paracentesis done yesterday with removal of only 1 mL of fluid, apparently there was no significant ascites.  Preliminary cultures negative. CIWA score of 4.  Counseling was provided for alcohol cessation.  11/2: Patient had another spike of fever up to 101.5 overnight.,Antibiotics switched to Levaquin. Blood pressure remained elevated-restarting home amlodipine. Potassium and corrected calcium remained low-being repleted. Worsening T. bili at 9.8 and INR 1.7-GI was consulted. Multiple labs to start the work-up of this new diagnosis of liver  cirrhosis especially at this age was ordered by GI. Ordered RUQ [place] for worsening abdominal distension.  11/3: Some improvement in abdominal distention after having 2-3 bowel movements. Right upper quadrant ultrasound with trace ascites only. GI switched him to MiraLAX due to concern of bloating with lactulose. GI also added low-dose Lasix, done and rifaximin. Labs for secondary causes of liver cirrhosis with negative ANA negative anti-smooth muscle antibody, mitochondrial antibody, phenotypes microsomal antibody.  Pending alpha 1 antitrypsin, ceruloplasmin, celiac disease panel and B1. Became febrile again despite change to antibiotic.  Sending blood and urine cultures to rule out any resistant bacteria..   Assessment and Plan: * Acute epididymitis Patient presents for evaluation of left testicular pain Testicle ultrasound is concerning for acute epididymitis We will place patient empirically on Rocephin and doxycycline Consult urology-recommending continuation of antibiotics, scrotal support, and cryotherapy as needed. HIV, chlamydia and gonorrhea PCR negative. Another fever spike despite changing of antibiotics to Levaquin Urine and blood cultures to rule out any resistant bacteria -Continue with Levaquin -Continue with supportive care  Alcoholic cirrhosis of liver with ascites ([place]) Patient with a history of alcohol abuse who presents for evaluation of diffuse abdominal pain associated with nausea and vomiting. CT scan of abdomen and pelvis shows morphologic features of the liver concerning for cirrhosis, including: marked hepatomegaly, irregular liver contour and severe hepatic steatosis with stigmata of portal venous hypertension noted including varices, splenomegaly and ascites which appears to be new. Paracentesis was ordered with removal of only 1 mL of fluid, apparently there was not much fluid present-preliminary cultures negative. -Patient was counseled for alcohol  cessation. -GI was consulted today due to worsening T. bili and INR-ordered multiple labs to rule out other possible etiology for liver dysfunction, mostly negative so far and few pending. -Right upper quadrant repeat  ultrasound with trace ascites on the -Started on low-dose Lasix, rifaximin and spironolactone -Lactulose switched with MiraLAX due to concern of bloating -Need to follow-up at liver center in a tertiary care setting.  Alcohol dependence with withdrawal (HCC) Patient admits to daily alcohol use with symptoms of alcohol withdrawal when he does not drink CIWA score of 4 -Continue with CIWA protocol -Continue with supportive care and supplements  Hypokalemia Potassium at 3.0. -Monitor potassium and replete as needed  Hypomagnesemia Magnesium 1.6. -Replace magnesium and monitor  Hypophosphatemia Phosphorous of 1.6. -Replace phosphorus and monitor  GERD (gastroesophageal reflux disease) Place patient on IV PPI  Obesity (BMI 30-39.9) Estimated body mass index is 37.63 kg/m as calculated from the following:   Height as of this encounter: 5\' 11"  (1.803 m).   Weight as of this encounter: 122.4 kg.   -Encouraged weight loss -Will complicate overall prognosis   Subjective: Patient cannot some abdominal pain.  Had 3 bowel movements. No nausea or vomiting.  Had fever overnight.  Physical Exam: Vitals:   12/26/21 0234 12/26/21 0359 12/26/21 0745 12/26/21 1159  BP: (!) 154/89 (!) 147/89 (!) 158/90 (!) 155/98  Pulse: (!) 105 99 (!) 108 (!) 109  Resp: 20 18 20 20   Temp: (!) 100.5 F (38.1 C) 98.2 F (36.8 C) 98.5 F (36.9 C) 98.4 F (36.9 C)  TempSrc:      SpO2: 94% 97% 97% 92%  Weight:      Height:       General.  Obese gentleman, in no acute distress. Pulmonary.  Lungs clear bilaterally, normal respiratory effort. CV.  Regular rate and rhythm, no JVD, rub or murmur. Abdomen.  Soft, nontender, nondistended, BS positive. CNS.  Alert and oriented .  No focal  neurologic deficit. Extremities.  No edema, no cyanosis, pulses intact and symmetrical. Psychiatry.  Judgment and insight appears normal.    Data Reviewed: Prior data reviewed  Family Communication: Talked with mother on phone.  Disposition: Status is: Inpatient Remains inpatient appropriate because: Severity of illness   Planned Discharge Destination: Home  Time spent: 50 minutes  This record has been created using 13/03/23. Errors have been sought and corrected,but may not always be located. Such creation errors do not reflect on the standard of care.  Author: , MD 12/26/2021 4:17 PM  For on call review [email or web address].

## 2021-12-26 NOTE — Consult Note (Signed)
PHARMACY CONSULT NOTE - FOLLOW UP  Pharmacy Consult for Electrolyte Monitoring and Replacement   Recent Labs: Potassium (mmol/L)  Date Value  12/26/2021 3.0 (L)   Magnesium (mg/dL)  Date Value  12/25/2021 1.6 (L)   Calcium (mg/dL)  Date Value  12/26/2021 6.9 (L)   Albumin (g/dL)  Date Value  12/26/2021 2.4 (L)   Phosphorus (mg/dL)  Date Value  12/25/2021 2.7   Sodium (mmol/L)  Date Value  12/26/2021 135    Assessment: 26 yo male admitted for treatment of acute epididymitis. PMH includes alcohol abuse, liver cirrhosis, and GERD. Pharmacy consulted to manage electrolytes. Pt is on lasix PO 20 mg daily and spironolactone 50 mg daily.   Goal of Therapy:  Electrolytes WNL  Plan:  Will give Kcl 40 mEq x 3.  F/u with AM labs.    Eleonore Chiquito, PharmD, BCPS 12/26/2021 8:55 AM

## 2021-12-27 ENCOUNTER — Inpatient Hospital Stay: Payer: Self-pay

## 2021-12-27 ENCOUNTER — Other Ambulatory Visit: Payer: Self-pay

## 2021-12-27 LAB — COMPREHENSIVE METABOLIC PANEL
ALT: 13 U/L (ref 0–44)
AST: 69 U/L — ABNORMAL HIGH (ref 15–41)
Albumin: 2.4 g/dL — ABNORMAL LOW (ref 3.5–5.0)
Alkaline Phosphatase: 110 U/L (ref 38–126)
Anion gap: 8 (ref 5–15)
BUN: <5 mg/dL — ABNORMAL LOW (ref 6–20)
CO2: 26 mmol/L (ref 22–32)
Calcium: 7.8 mg/dL — ABNORMAL LOW (ref 8.9–10.3)
Chloride: 102 mmol/L (ref 98–111)
Creatinine, Ser: 0.42 mg/dL — ABNORMAL LOW (ref 0.61–1.24)
GFR, Estimated: >60 mL/min (ref >60)
Glucose, Bld: 93 mg/dL (ref 70–99)
Potassium: 3.7 mmol/L (ref 3.5–5.1)
Sodium: 136 mmol/L (ref 135–145)
Total Bilirubin: 12 mg/dL — ABNORMAL HIGH (ref 0.3–1.2)
Total Protein: 7.3 g/dL (ref 6.5–8.1)

## 2021-12-27 LAB — BILIRUBIN, FRACTIONATED(TOT/DIR/INDIR)
Bilirubin, Direct: 6.9 mg/dL — ABNORMAL HIGH (ref 0.0–0.2)
Indirect Bilirubin: 4.8 mg/dL — ABNORMAL HIGH (ref 0.3–0.9)
Total Bilirubin: 11.7 mg/dL — ABNORMAL HIGH (ref 0.3–1.2)

## 2021-12-27 LAB — CELIAC DISEASE PANEL
Endomysial Ab, IgA: NEGATIVE
IgA: 767 mg/dL — ABNORMAL HIGH (ref 90–386)
Tissue Transglutaminase Ab, IgA: 2 U/mL (ref 0–3)

## 2021-12-27 LAB — BODY FLUID CULTURE W GRAM STAIN: Culture: NO GROWTH

## 2021-12-27 LAB — PROTIME-INR
INR: 1.8 — ABNORMAL HIGH (ref 0.8–1.2)
Prothrombin Time: 20.5 seconds — ABNORMAL HIGH (ref 11.4–15.2)

## 2021-12-27 LAB — URINE CULTURE: Culture: NO GROWTH

## 2021-12-27 LAB — CERULOPLASMIN: Ceruloplasmin: 31.9 mg/dL — ABNORMAL HIGH (ref 16.0–31.0)

## 2021-12-27 LAB — MAGNESIUM: Magnesium: 1.6 mg/dL — ABNORMAL LOW (ref 1.7–2.4)

## 2021-12-27 LAB — PHOSPHORUS: Phosphorus: 2.5 mg/dL (ref 2.5–4.6)

## 2021-12-27 MED ORDER — MAGNESIUM SULFATE 4 GM/100ML IV SOLN
4.0000 g | Freq: Once | INTRAVENOUS | Status: AC
  Administered 2021-12-27: 4 g via INTRAVENOUS
  Filled 2021-12-27: qty 100

## 2021-12-27 MED ORDER — ENOXAPARIN SODIUM 60 MG/0.6ML IJ SOSY
0.5000 mg/kg | PREFILLED_SYRINGE | INTRAMUSCULAR | Status: DC
  Administered 2021-12-27 – 2021-12-31 (×5): 60 mg via SUBCUTANEOUS
  Filled 2021-12-27 (×5): qty 0.6

## 2021-12-27 MED ORDER — GADOBUTROL 1 MMOL/ML IV SOLN
10.0000 mL | Freq: Once | INTRAVENOUS | Status: AC | PRN
  Administered 2021-12-27: 10 mL via INTRAVENOUS

## 2021-12-27 MED ORDER — LORAZEPAM 2 MG/ML IJ SOLN
1.0000 mg | Freq: Once | INTRAMUSCULAR | Status: AC
  Administered 2021-12-27: 1 mg via INTRAVENOUS
  Filled 2021-12-27: qty 1

## 2021-12-27 MED ORDER — PIPERACILLIN-TAZOBACTAM 3.375 G IVPB
3.3750 g | Freq: Three times a day (TID) | INTRAVENOUS | Status: DC
  Administered 2021-12-27 – 2022-01-01 (×15): 3.375 g via INTRAVENOUS
  Filled 2021-12-27 (×14): qty 50

## 2021-12-27 MED ORDER — KETOROLAC TROMETHAMINE 15 MG/ML IJ SOLN
15.0000 mg | Freq: Three times a day (TID) | INTRAMUSCULAR | Status: AC | PRN
  Administered 2021-12-27 – 2022-01-01 (×9): 15 mg via INTRAVENOUS
  Filled 2021-12-27 (×9): qty 1

## 2021-12-27 MED ORDER — MORPHINE SULFATE ER 15 MG PO TBCR
15.0000 mg | EXTENDED_RELEASE_TABLET | Freq: Two times a day (BID) | ORAL | Status: DC
  Administered 2021-12-27 – 2022-01-01 (×11): 15 mg via ORAL
  Filled 2021-12-27 (×11): qty 1

## 2021-12-27 NOTE — Assessment & Plan Note (Signed)
Magnesium 1.6. -Replace magnesium and monitor

## 2021-12-27 NOTE — Progress Notes (Signed)
Cameron Repress, MD [address]  [address]  [place], [place] [address]  Main: [phone]  Fax: [phone] Pager: [phone]   Subjective: Patient continues to remain febrile. Patient denies any abdominal pain, nausea or vomiting   Objective: Vital signs in last 24 hours: Vitals:   12/27/21 0235 12/27/21 0442 12/27/21 0739 12/27/21 1232  BP: (!) 151/88 (!) 152/75 131/72 (!) 152/87  Pulse: (!) 102 99 98 (!) 103  Resp: 18 18 18 20   Temp: 99.4 F (37.4 C)  (!) 100.5 F (38.1 C) 99.3 F (37.4 C)  TempSrc:   Oral   SpO2: 98% 95% 95% 92%  Weight:      Height:       Weight change:  No intake or output data in the 24 hours ending 12/27/21 1251    Exam: Heart:: Regular rate and rhythm, S1S2 present, or without murmur or extra heart sounds Lungs: normal and clear to auscultation Abdomen: Soft, nontender, diffusely distended, tympanic to percussion Extremities: 2+ edema   Lab Results:    Latest Ref Rng & Units 12/26/2021    5:29 AM 12/25/2021    5:40 AM 12/24/2021    6:25 AM  CBC  WBC 4.0 - 10.5 K/uL 9.2  9.5  10.9   Hemoglobin 13.0 - 17.0 g/dL 13/02/2021  28.7  86.7   Hematocrit 39.0 - 52.0 % 33.9  35.8  35.3   Platelets 150 - 400 K/uL 147  139  147       Latest Ref Rng & Units 12/27/2021    5:09 AM 12/26/2021    5:29 AM 12/25/2021    5:40 AM  CMP  Glucose 70 - 99 mg/dL 93  13/03/2021  75   BUN 6 - 20 mg/dL <5  <5  <5   Creatinine 0.61 - 1.24 mg/dL 094  7.09  6.28   Sodium 135 - 145 mmol/L 136  135  135   Potassium 3.5 - 5.1 mmol/L 3.7  3.0  3.2   Chloride 98 - 111 mmol/L 102  99  99   CO2 22 - 32 mmol/L 26  26  26    Calcium 8.9 - 10.3 mg/dL 7.8  6.9  6.7   Total Protein 6.5 - 8.1 g/dL 7.3  7.1  7.3   Total Bilirubin 0.3 - 1.2 mg/dL 3.66   9.8   Alkaline Phos 38 - 126 U/L 110  111  123   AST 15 - 41 U/L 69  76  90   ALT 0 - 44 U/L 13  17  15      Micro Results: Recent Results (from the past 240 hour(s))  Chlamydia/NGC rt PCR ([facility] only)     Status:  None   Collection Time: 12/21/21 11:53 PM   Specimen: Urine  Result Value Ref Range Status   Specimen source GC/Chlam CHLAMYDIA SPECIES  Final   Chlamydia Tr NOT DETECTED NOT DETECTED Final   N gonorrhoeae NOT DETECTED NOT DETECTED Final    Comment: (NOTE) This CT/NG assay has not been evaluated in patients with a history of  hysterectomy. Performed at [facility], [address] [place], [facility] [address]   Body fluid culture w Gram Stain     Status: None   Collection Time: 12/23/21  2:25 PM   Specimen: PATH Cytology Peritoneal fluid  Result Value Ref Range Status   Specimen Description   Final    PERITONEAL Performed at [facility], [address] [place], [place] [address]    Special Requests   Final    NONE Performed at [facility], [address] [place], [place] [address]    Gram Stain   Final    RARE WBC PRESENT, PREDOMINANTLY MONONUCLEAR NO ORGANISMS SEEN    Culture   Final    NO GROWTH 3 DAYS Performed at [facility], [address], [place], [place] [address]    Report Status 12/27/2021 FINAL  Final  Urine Culture     Status: None   Collection Time: 12/26/21 10:28 AM   Specimen: Urine, Clean Catch  Result Value Ref Range Status   Specimen Description   Final    URINE, CLEAN CATCH Performed at [facility], [address], [place], [place] [address]    Special Requests   Final    NONE Performed at [facility], [address] [place], [place] [address]    Culture   Final    NO GROWTH Performed at [facility], [address], [place], [place] [address]    Report Status 12/27/2021 FINAL  Final  Culture, blood (Routine X 2) w Reflex to ID Panel     Status: None (Preliminary result)   Collection Time: 12/26/21 10:57 AM   Specimen: BLOOD  Result Value Ref Range Status   Specimen Description BLOOD RIGHT ANTECUBITAL  Final   Special Requests   Final    BOTTLES DRAWN AEROBIC AND ANAEROBIC  Blood Culture adequate volume   Culture   Final    NO GROWTH < 24 HOURS Performed at [facility], [address] [place], [place] [address]    Report Status PENDING  Incomplete  Culture, blood (Routine X 2) w Reflex to ID Panel     Status: None (Preliminary result)   Collection Time: 12/26/21 10:57 AM   Specimen: BLOOD  Result Value Ref Range Status   Specimen Description BLOOD BLOOD RIGHT HAND  Final   Special Requests   Final    BOTTLES DRAWN AEROBIC AND ANAEROBIC Blood Culture adequate volume   Culture   Final    NO GROWTH < 24 HOURS Performed at [facility], [address] [place], [place] [address]    Report Status PENDING  Incomplete   Studies/Results: [place] ASCITES (ABDOMEN LIMITED)  Result Date: 12/25/2021 CLINICAL DATA:  Ascites EXAM: LIMITED ABDOMEN ULTRASOUND FOR ASCITES TECHNIQUE: Limited ultrasound survey for ascites was performed in all four abdominal quadrants. COMPARISON:  12/23/2021 FINDINGS: There is trace amount of ascites in left upper quadrant of abdomen. There is significant interval decrease in amount of ascites. IMPRESSION: Trace amount of residual ascites is seen in left upper quadrant. Electronically Signed   By: Ernie Avena M.D.   On: 12/25/2021 17:50   Medications: I have reviewed the patient's current medications. Prior to Admission:  Medications Prior to Admission  Medication Sig Dispense Refill Last Dose   albuterol (VENTOLIN HFA) 108 (90 Base) MCG/ACT inhaler Inhale 2 puffs into the lungs every 6 (six) hours as needed for wheezing or shortness of breath. 8 g 2 prn   amLODipine (NORVASC) 5 MG tablet Take 1 tablet (5 mg total) by mouth daily. 30 tablet 0    dicyclomine (BENTYL) 10 MG capsule Take 1 capsule (10 mg total) by mouth 3 (three) times daily as needed (abd pain). (Patient not taking: Reported on 12/22/2021) 20 capsule 0 Not Taking   famotidine (PEPCID) 20 MG tablet Take 1 tablet (20 mg  total) by mouth 2 (two) times  daily. (Patient not taking: Reported on 12/22/2021) 60 tablet 0 Not Taking   ondansetron (ZOFRAN-ODT) 4 MG disintegrating tablet Take 1 tablet (4 mg total) by mouth every 8 (eight) hours as needed for nausea or vomiting. (Patient not taking: Reported on 07/10/2021) 20 tablet 0 Not Taking   sucralfate (CARAFATE) 1 g tablet Take 1 tablet (1 g total) by mouth 4 (four) times daily. (Patient not taking: Reported on 12/22/2021) 20 tablet 0 Not Taking   Scheduled:  amLODipine  5 mg Oral Daily   enoxaparin (LOVENOX) injection  0.5 mg/kg Subcutaneous Y63Z   folic acid  1 mg Oral Daily   furosemide  40 mg Oral Daily   morphine  15 mg Oral BID   multivitamin with minerals  1 tablet Oral Daily   pantoprazole  40 mg Oral Daily   polyethylene glycol  34 g Oral Daily   rifaximin  550 mg Oral BID   spironolactone  100 mg Oral Daily   thiamine  100 mg Oral Daily   Or   thiamine  100 mg Intravenous Daily   Continuous:  magnesium sulfate bolus IVPB     piperacillin-tazobactam (ZOSYN)  IV     CHY:IFOYDXAJO, ketorolac, morphine injection, naLOXone (NARCAN)  injection, ondansetron **OR** ondansetron (ZOFRAN) IV Anti-infectives (From admission, onward)    Start     Dose/Rate Route Frequency Ordered Stop   12/27/21 1000  piperacillin-tazobactam (ZOSYN) IVPB 3.375 g        3.375 g 12.5 mL/hr over 240 Minutes Intravenous Every 8 hours 12/27/21 0849     12/25/21 2200  rifaximin (XIFAXAN) tablet 550 mg        550 mg Oral 2 times daily 12/25/21 1731     12/25/21 1000  levofloxacin (LEVAQUIN) IVPB 500 mg  Status:  Discontinued        500 mg 100 mL/hr over 60 Minutes Intravenous Every 24 hours 12/25/21 0834 12/27/21 0830   12/23/21 0000  cefTRIAXone (ROCEPHIN) 2 g in sodium chloride 0.9 % 100 mL IVPB        2 g 200 mL/hr over 30 Minutes Intravenous  Once 12/22/21 1031 12/23/21 0423   12/22/21 2300  doxycycline (VIBRAMYCIN) 100 mg in sodium chloride 0.9 % 250 mL IVPB  Status:  Discontinued        100 mg 125  mL/hr over 120 Minutes Intravenous Every 12 hours 12/22/21 1031 12/25/21 0833   12/22/21 1000  doxycycline (VIBRAMYCIN) 100 mg in sodium chloride 0.9 % 250 mL IVPB        100 mg 125 mL/hr over 120 Minutes Intravenous  Once 12/22/21 0949 12/22/21 1540   12/22/21 0930  levofloxacin (LEVAQUIN) IVPB 500 mg  Status:  Discontinued        500 mg 100 mL/hr over 60 Minutes Intravenous  Once 12/22/21 0924 12/22/21 0949   12/22/21 0730  cefTRIAXone (ROCEPHIN) 2 g in sodium chloride 0.9 % 100 mL IVPB        2 g 200 mL/hr over 30 Minutes Intravenous  Once 12/22/21 0724 12/22/21 0823      Scheduled Meds:  amLODipine  5 mg Oral Daily   enoxaparin (LOVENOX) injection  0.5 mg/kg Subcutaneous I78M   folic acid  1 mg Oral Daily   furosemide  40 mg Oral Daily   morphine  15 mg Oral BID   multivitamin with minerals  1 tablet Oral Daily   pantoprazole  40 mg Oral Daily  polyethylene glycol  34 g Oral Daily   rifaximin  550 mg Oral BID   spironolactone  100 mg Oral Daily   thiamine  100 mg Oral Daily   Or   thiamine  100 mg Intravenous Daily   Continuous Infusions:  magnesium sulfate bolus IVPB     piperacillin-tazobactam (ZOSYN)  IV     PRN Meds:.albuterol, ketorolac, morphine injection, naLOXone (NARCAN)  injection, ondansetron **OR** ondansetron (ZOFRAN) IV   Assessment: Principal Problem:   Acute epididymitis Active Problems:   Alcohol dependence with withdrawal (HCC)   GERD (gastroesophageal reflux disease)   Hypokalemia   Hypomagnesemia   Hypophosphatemia   Alcoholic cirrhosis of liver with ascites (HCC)   Obesity (BMI 30-39.9)   Elevated bilirubin   Elevated LFTs  Cameron Santiago is a 26 y.o. male with alcohol abuse, history of alcoholic pancreatitis, s/p appendectomy is admitted with decompensated cirrhosis of liver and acute epididymitis   Plan:  Decompensated cirrhosis of liver, most likely secondary to alcohol abuse MELD sodium 23, child class C, CPT-11 secondary liver  disease work-up has been negative thus far Volume overload with ascites and swelling of legs, s/p paracentesis, no evidence of SBP SAAG> 1.1 consistent with portal hypertension, strict low-sodium diet Recommend to increase Lasix to 40 mg a spironolactone 100 mg dail, monitor his renal function and electrolytes No evidence of PVT based on CT abdomen pelvis with IV contrast Nonurgent EGD for variceal screening No evidence of liver lesions Continue MiraLAX 34 g daily and Xifaxan 550 mg twice daily HRS: none PSE: none He will need complete abstinence from alcohol use, otherwise prognosis is guarded We will hold off on prednisone use as patient is currently being worked up for febrile episodes  Febrile episodes Currently being treated for acute epididymitis Antibiotic switched to Zosyn Blood cultures and urine cultures have showed no growth to date Ultrasound scrotum is pending for today No evidence of SBP Recommend infectious disease consult  Hyperbilirubinemia Transaminases including alkaline phosphatase are downtrending T. bili is up trending, check fractionated bilirubin, expect bilirubin to improve as a late phase Avoid hepatotoxic agents or dose appropriately       Thank you for involving me in the care of this patient.  GI will follow along with you       LOS: 5 days   Cameron Santiago 12/27/2021, 12:51 PM

## 2021-12-27 NOTE — Consult Note (Addendum)
PHARMACY CONSULT NOTE - FOLLOW UP  Pharmacy Consult for Electrolyte Monitoring and Replacement   Recent Labs: Potassium (mmol/L)  Date Value  12/27/2021 3.7   Magnesium (mg/dL)  Date Value  12/27/2021 1.6 (L)   Calcium (mg/dL)  Date Value  12/27/2021 7.8 (L)   Albumin (g/dL)  Date Value  12/27/2021 2.4 (L)   Phosphorus (mg/dL)  Date Value  12/25/2021 2.7   Sodium (mmol/L)  Date Value  12/27/2021 136    Assessment: 26 yo male admitted for treatment of acute epididymitis. PMH includes alcohol abuse, liver cirrhosis, and GERD. Pharmacy consulted to manage electrolytes. Pt is on lasix PO 20 mg daily and spironolactone 50 mg daily.   Goal of Therapy:  Electrolytes WNL  Plan:  K 3.7  Mag 1.6  Phos 2.5  Scr 0.42   Ca 7.8  alb 2.4  Corrected Ca 9.1 Currently on furosemide 40mg  po daily, spironolactone 100mg  daily -Will order Magnesium 4 gm IV x 1  (noted pt has received 2 gm Mag when other Mag levels were 1.6 and doesn't seem to be correcting it) F/u with AM labs.    Chinita Greenland PharmD Clinical Pharmacist 12/27/2021'

## 2021-12-27 NOTE — Progress Notes (Signed)
Pharmacy Antibiotic Note  Cameron Santiago is a 26 y.o. male w/ PMH of alcohol abuse, liver cirrhosis, and GERD admitted on 12/22/2021 with  acute epididymitis .  Pharmacy has been consulted for Zosyn dosing. Renal function is stable  Plan: start Zosyn 3.375g IV q8h (4 hour infusion).  Height: 5\' 11"  (180.3 cm) Weight: 122.4 kg (269 lb 12.8 oz) IBW/kg (Calculated) : 75.3  Temp (24hrs), Avg:100 F (37.8 C), Min:98.4 F (36.9 C), Max:101.2 F (38.4 C)  Recent Labs  Lab 12/21/21 2353 12/23/21 0242 12/24/21 0625 12/25/21 0540 12/26/21 0529 12/27/21 0509  WBC 12.0* 12.0* 10.9* 9.5 9.2  --   CREATININE 0.40* <0.30*  --  0.44* 0.37* 0.42*    Estimated Creatinine Clearance: 187.9 mL/min (A) (by C-G formula based on SCr of 0.42 mg/dL (L)).    No Known Allergies  Antimicrobials this admission: 10/30 ceftriaxone >> 10/31 10/30 doxycycline >> 11/01 11/02 levofloxacin >> 11/03 11/04 Zosyn >>   Microbiology results: 11/03 BCx: pending 10/31 peritoneal fluid: NG (pending) 11/03 UCx: pending   Thank you for allowing pharmacy to be a part of this patient's care.  Dallie Piles 12/27/2021 8:44 AM

## 2021-12-27 NOTE — Progress Notes (Signed)
Progress Note   Patient: Cameron Santiago OZD:[id number] DOB: [birth year] DOA: 12/22/2021     5 DOS: the patient was seen and examined on 12/27/2021   Brief hospital course: Taken from prior notes.   26 years old male with PMH significant for alcohol abuse, alcoholic liver cirrhosis, GERD who presents to the ER for evaluation of abdominal pain as well as testicular pain. Patient states symptoms started 3 days ago and is associated with nausea and vomiting.He complains of lower testicular pain but denies having any penile discharge.  He is currently not sexually active.  Work-up in the ED showed hypokalemia, hypomagnesemia GC and chlamydia cultures negative.  CT abdomen showed morphological features of liver cirrhosis, including hepatomegaly, irregular liver contour and severe hepatic steatosis.  Testicular ultrasound showed finding consistent with acute epididymitis.  Patient is admitted for further evaluation.  He is started on ceftriaxone and doxycycline.  Urology was consulted, recommended to continue antibiotics, adequate pain control, scrotal support, compressive underwear, cryotherapy as needed.  11/1: Patient continued to have significant pain.  Multiple electrolyte abnormalities which include hypomagnesemia, hypophosphatemia and hypokalemia which are being repleted. Gonorrhea and chlamydia PCR negative, HIV negative.  Ordered RPR. Patient also has paracentesis done yesterday with removal of only 1 mL of fluid, apparently there was no significant ascites.  Preliminary cultures negative. CIWA score of 4.  Counseling was provided for alcohol cessation.  11/2: Patient had another spike of fever up to 101.5 overnight.,Antibiotics switched to Levaquin. Blood pressure remained elevated-restarting home amlodipine. Potassium and corrected calcium remained low-being repleted. Worsening T. bili at 9.8 and INR 1.7-GI was consulted. Multiple labs to start the work-up of this new diagnosis of liver  cirrhosis especially at this age was ordered by GI. Ordered RUQ [place] for worsening abdominal distension.  11/3: Some improvement in abdominal distention after having 2-3 bowel movements. Right upper quadrant ultrasound with trace ascites only. GI switched him to MiraLAX due to concern of bloating with lactulose. GI also added low-dose Lasix, done and rifaximin. Labs for secondary causes of liver cirrhosis with negative ANA negative anti-smooth muscle antibody, mitochondrial antibody, phenotypes microsomal antibody.  Pending alpha 1 antitrypsin, ceruloplasmin, celiac disease panel and B1. Became febrile again despite change to antibiotic.  Sending blood and urine cultures to rule out any resistant bacteria.Marland Kitchen  11/4: Patient remained febrile, continued to have significant abdominal pain.  Switching antibiotics to Zosyn while waiting for urine and blood cultures.  Ordered repeat scrotal ultrasound  to rule out any abscesses.  Ordered an MRI of liver as advised on CT abdomen for further characterization of his liver lesion as he has deteriorating liver function. Worsening T. bili and INR.   Assessment and Plan: * Acute epididymitis Patient presents for evaluation of left testicular pain Testicle ultrasound is concerning for acute epididymitis Consult urology-recommending continuation of antibiotics, scrotal support, and cryotherapy as needed. HIV, chlamydia and gonorrhea PCR negative. Continue to have fever, she will receive doxycycline and ceftriaxone followed by Levaquin. -Switching Levaquin to Zosyn -Repeat scrotal ultrasound to rule out any abscesses Urine and blood cultures to rule out any resistant bacteria .-Continue with supportive care  Alcoholic cirrhosis of liver with ascites ([place]) Worsening function with worsening T. bili and INR Patient with a history of alcohol abuse who presents for evaluation of diffuse abdominal pain associated with nausea and vomiting. CT scan of abdomen and  pelvis shows morphologic features of the liver concerning for cirrhosis, including: marked hepatomegaly, irregular liver contour and severe hepatic steatosis with  stigmata of portal venous hypertension noted including varices, splenomegaly and ascites which appears to be new. Paracentesis was ordered with removal of only 1 mL of fluid, apparently there was not much fluid present-preliminary cultures negative. -Patient was counseled for alcohol cessation. -GI was consulted today due to worsening T. bili and INR-ordered multiple labs to rule out other possible etiology for liver dysfunction, mostly negative so far and few pending. -Right upper quadrant repeat ultrasound with trace ascites on the -Started on low-dose Lasix, rifaximin and spironolactone -Lactulose switched with MiraLAX due to concern of bloating -Liver MRI -Need to follow-up at liver center in a tertiary care setting.  Alcohol dependence with withdrawal (HCC) Patient admits to daily alcohol use with symptoms of alcohol withdrawal when he does not drink CIWA score of 4 -Continue with CIWA protocol -Continue with supportive care and supplements  Hypokalemia Potassium at 3.7. -Monitor potassium and replete as needed  Hypomagnesemia Magnesium 1.6. -Replace magnesium and monitor  Hypophosphatemia Phosphorous of 2.5, improved. -Replace phosphorus as needed and monitor  GERD (gastroesophageal reflux disease) Place patient on IV PPI  Obesity (BMI 30-39.9) Estimated body mass index is 37.63 kg/m as calculated from the following:   Height as of this encounter: 5\' 11"  (1.803 m).   Weight as of this encounter: 122.4 kg.   -Encouraged weight loss -Will complicate overall prognosis   Subjective: Patient  continued to have significant abdominal pain and distention. Scrotal pain has been improved.  Continued to have fever.  Having 3-4 bowel movements  Physical Exam: Vitals:   12/27/21 0235 12/27/21 0442 12/27/21 0739  12/27/21 1232  BP: (!) 151/88 (!) 152/75 131/72 (!) 152/87  Pulse: (!) 102 99 98 (!) 103  Resp: 18 18 18 20   Temp: 99.4 F (37.4 C)  (!) 100.5 F (38.1 C) 99.3 F (37.4 C)  TempSrc:   Oral   SpO2: 98% 95% 95% 92%  Weight:      Height:       General.  Obese gentleman, in no acute distress. Pulmonary.  Lungs clear bilaterally, normal respiratory effort. CV.  Regular rate and rhythm, no JVD, rub or murmur. Abdomen.  Distended abdomen with mild diffuse tenderness, BS positive. CNS.  Alert and oriented .  No focal neurologic deficit. Extremities.  No edema, no cyanosis, pulses intact and symmetrical. Psychiatry.  Judgment and insight appears normal.     Data Reviewed: Prior data reviewed  Family Communication: Discussed with girlfriend at bedside  Disposition: Status is: Inpatient Remains inpatient appropriate because: Severity of illness   Planned Discharge Destination: Home  Time spent: 50 minutes  This record has been created using 13/04/23. Errors have been sought and corrected,but may not always be located. Such creation errors do not reflect on the standard of care.  Author: , MD 12/27/2021 3:20 PM  For on call review [email or web address].

## 2021-12-28 ENCOUNTER — Inpatient Hospital Stay: Payer: Self-pay

## 2021-12-28 DIAGNOSIS — E669 Obesity, unspecified: Secondary | ICD-10-CM

## 2021-12-28 LAB — BASIC METABOLIC PANEL
Anion gap: 10 (ref 5–15)
BUN: <5 mg/dL — ABNORMAL LOW (ref 6–20)
CO2: 26 mmol/L (ref 22–32)
Calcium: 8.2 mg/dL — ABNORMAL LOW (ref 8.9–10.3)
Chloride: 98 mmol/L (ref 98–111)
Creatinine, Ser: 0.41 mg/dL — ABNORMAL LOW (ref 0.61–1.24)
GFR, Estimated: >60 mL/min (ref >60)
Glucose, Bld: 111 mg/dL — ABNORMAL HIGH (ref 70–99)
Potassium: 3.8 mmol/L (ref 3.5–5.1)
Sodium: 134 mmol/L — ABNORMAL LOW (ref 135–145)

## 2021-12-28 LAB — PROTIME-INR
INR: 1.6 — ABNORMAL HIGH (ref 0.8–1.2)
Prothrombin Time: 19.2 seconds — ABNORMAL HIGH (ref 11.4–15.2)

## 2021-12-28 LAB — HEPATIC FUNCTION PANEL
ALT: 18 U/L (ref 0–44)
AST: 82 U/L — ABNORMAL HIGH (ref 15–41)
Albumin: 2.6 g/dL — ABNORMAL LOW (ref 3.5–5.0)
Alkaline Phosphatase: 111 U/L (ref 38–126)
Bilirubin, Direct: 7.9 mg/dL — ABNORMAL HIGH (ref 0.0–0.2)
Indirect Bilirubin: 5.5 mg/dL — ABNORMAL HIGH (ref 0.3–0.9)
Total Bilirubin: 13.4 mg/dL — ABNORMAL HIGH (ref 0.3–1.2)
Total Protein: 7.6 g/dL (ref 6.5–8.1)

## 2021-12-28 LAB — ALPHA-1-ANTITRYPSIN: A-1 Antitrypsin, Ser: 223 mg/dL — ABNORMAL HIGH (ref 95–164)

## 2021-12-28 LAB — HEMOGLOBIN A1C
Hgb A1c MFr Bld: 5.2 % (ref 4.8–5.6)
Mean Plasma Glucose: 102.54 mg/dL

## 2021-12-28 LAB — MAGNESIUM: Magnesium: 1.9 mg/dL (ref 1.7–2.4)

## 2021-12-28 LAB — PHOSPHORUS: Phosphorus: 3.5 mg/dL (ref 2.5–4.6)

## 2021-12-28 MED ORDER — MAGNESIUM SULFATE IN D5W 1-5 GM/100ML-% IV SOLN
1.0000 g | Freq: Once | INTRAVENOUS | Status: AC
  Administered 2021-12-28: 1 g via INTRAVENOUS
  Filled 2021-12-28: qty 100

## 2021-12-28 NOTE — Progress Notes (Signed)
Progress Note   Patient: Cameron Santiago [id number]:[id number] DOB: Aug 30, 1995 DOA: 12/22/2021     6 DOS: the patient was seen and examined on 12/28/2021   Brief hospital course: Taken from prior notes.   26 years old male with PMH significant for alcohol abuse, alcoholic liver cirrhosis, GERD who presents to the ER for evaluation of abdominal pain as well as testicular pain. Patient states symptoms started 3 days ago and is associated with nausea and vomiting.He complains of lower testicular pain but denies having any penile discharge.  He is currently not sexually active.  Work-up in the ED showed hypokalemia, hypomagnesemia GC and chlamydia cultures negative.  CT abdomen showed morphological features of liver cirrhosis, including hepatomegaly, irregular liver contour and severe hepatic steatosis.  Testicular ultrasound showed finding consistent with acute epididymitis.  Patient is admitted for further evaluation.  He is started on ceftriaxone and doxycycline.  Urology was consulted, recommended to continue antibiotics, adequate pain control, scrotal support, compressive underwear, cryotherapy as needed.  11/1: Patient continued to have significant pain.  Multiple electrolyte abnormalities which include hypomagnesemia, hypophosphatemia and hypokalemia which are being repleted. Gonorrhea and chlamydia PCR negative, HIV negative.  Ordered RPR. Patient also has paracentesis done yesterday with removal of only 1 mL of fluid, apparently there was no significant ascites.  Preliminary cultures negative. CIWA score of 4.  Counseling was provided for alcohol cessation.  11/2: Patient had another spike of fever up to 101.5 overnight.,Antibiotics switched to Levaquin. Blood pressure remained elevated-restarting home amlodipine. Potassium and corrected calcium remained low-being repleted. Worsening T. bili at 9.8 and INR 1.7-GI was consulted. Multiple labs to start the work-up of this new diagnosis of liver  cirrhosis especially at this age was ordered by GI. Ordered RUQ [place] for worsening abdominal distension.  11/3: Some improvement in abdominal distention after having 2-3 bowel movements. Right upper quadrant ultrasound with trace ascites only. GI switched him to MiraLAX due to concern of bloating with lactulose. GI also added low-dose Lasix, done and rifaximin. Labs for secondary causes of liver cirrhosis with negative ANA negative anti-smooth muscle antibody, mitochondrial antibody, phenotypes microsomal antibody.  Pending alpha 1 antitrypsin, ceruloplasmin, celiac disease panel and B1. Became febrile again despite change to antibiotic.  Sending blood and urine cultures to rule out any resistant bacteria.Marland Kitchen  11/4: Patient remained febrile, continued to have significant abdominal pain.  Switching antibiotics to Zosyn while waiting for urine and blood cultures.  Ordered repeat scrotal ultrasound  to rule out any abscesses.  Ordered an MRI of liver as advised on CT abdomen for further characterization of his liver lesion as he has deteriorating liver function. Worsening T. bili and INR.  11/5: Patient continued to have a low-grade fever.  Liver MRI with concern of Karlene Lineman with cirrhosis, focal fat deposits and no evidence of HCC.  Gallbladder wall thickening with no cholecystitis.  Very mild ascites.  Blood and urine cultures remain negative. MELD sodium 23, child class C, worsening T. bili with slight improvement in INR secondary liver disease work-up with mildly elevated ceruloplasmin 31.9.  Celiac disease panel with elevated IgA at 767.  Rest of the work-up negative. Alpha 1 antitrypsin and B1 levels pending. GI increase the dose of Lasix to 40 and spironolactone to 100   Assessment and Plan: * Acute epididymitis Patient presents for evaluation of left testicular pain Testicle ultrasound is concerning for acute epididymitis Consult urology-recommending continuation of antibiotics, scrotal support,  and cryotherapy as needed. HIV, chlamydia and gonorrhea PCR negative.  Continue to have fever he received doxycycline and ceftriaxone followed by Levaquin, currently on Zosyn -Continue Zosyn -Repeat scrotal ultrasound to rule out any abscesses-apparently patient refused Urine and blood cultures negative .-Continue with supportive care  Alcoholic cirrhosis of liver with ascites (HCC) Worsening function with worsening T. bili and INR with small improvement with Patient with a history of alcohol abuse who presents for evaluation of diffuse abdominal pain associated with nausea and vomiting. CT scan of abdomen and pelvis shows morphologic features of the liver concerning for cirrhosis, including: marked hepatomegaly, irregular liver contour and severe hepatic steatosis with stigmata of portal venous hypertension noted including varices, splenomegaly and ascites which appears to be new. Paracentesis was ordered with removal of only 1 mL of fluid, apparently there was not much fluid present-preliminary cultures negative. -Patient was counseled for alcohol cessation. -GI was consulted  due to worsening T. bili and INR-ordered multiple labs to rule out other possible etiology for liver dysfunction, mostly negative so far and few pending. -Right upper quadrant repeat ultrasound with trace ascites only. -Started on  Lasix, rifaximin and spironolactone -Lactulose switched with MiraLAX due to concern of bloating -Liver MRI-confirms cirrhosis, no concern of HCC, mild ascites -Need to follow-up at liver center in a tertiary care setting.  Alcohol dependence with withdrawal (HCC) Patient admits to daily alcohol use with symptoms of alcohol withdrawal when he does not drink CIWA score of 4 -Continue with CIWA protocol -Continue with supportive care and supplements  Hypokalemia Potassium at 3.8. -Monitor potassium and replete as needed  Hypomagnesemia Magnesium 1.6. -Replace magnesium and  monitor  Hypophosphatemia Phosphorous of 2.5, improved. -Replace phosphorus as needed and monitor  GERD (gastroesophageal reflux disease) Place patient on IV PPI  Obesity (BMI 30-39.9) Estimated body mass index is 37.63 kg/m as calculated from the following:   Height as of this encounter: 5\' 11"  (1.803 m).   Weight as of this encounter: 122.4 kg.   -Encouraged weight loss -Will complicate overall prognosis   Subjective: Patient with some improvement in his belly pain.  Having good bowel movements.  Girlfriend at bedside  Physical Exam: Vitals:   12/28/21 0026 12/28/21 0315 12/28/21 0316 12/28/21 1609  BP: (!) 162/78 (!) 145/70  (!) 149/78  Pulse: (!) 108 (!) 104 (!) 102 96  Resp: 20 18  18   Temp: 98.8 F (37.1 C) 100 F (37.8 C)  98.8 F (37.1 C)  TempSrc:  Oral    SpO2: 94% (!) 89% 90% 92%  Weight:      Height:       General.  Obese gentleman in no acute distress. Pulmonary.  Lungs clear bilaterally, normal respiratory effort. CV.  Regular rate and rhythm, no JVD, rub or murmur. Abdomen.  Soft, nontender, nondistended, BS positive. CNS.  Alert and oriented .  No focal neurologic deficit. Extremities.  1+ LE edema, no cyanosis, pulses intact and symmetrical. Psychiatry.  Judgment and insight appears normal.   Data Reviewed: Prior data reviewed  Family Communication: Discussed with girlfriend at bedside  Disposition: Status is: Inpatient Remains inpatient appropriate because: Severity of illness   Planned Discharge Destination: Home  Time spent: 51 minutes  This record has been created using 13/05/23. Errors have been sought and corrected,but may not always be located. Such creation errors do not reflect on the standard of care.  Author: , MD 12/28/2021 4:53 PM  For on call review [email or web address].

## 2021-12-28 NOTE — Consult Note (Signed)
PHARMACY CONSULT NOTE - FOLLOW UP  Pharmacy Consult for Electrolyte Monitoring and Replacement   Recent Labs: Potassium (mmol/L)  Date Value  12/28/2021 3.8   Magnesium (mg/dL)  Date Value  12/28/2021 1.9   Calcium (mg/dL)  Date Value  12/28/2021 8.2 (L)   Albumin (g/dL)  Date Value  12/28/2021 2.6 (L)   Phosphorus (mg/dL)  Date Value  12/28/2021 3.5   Sodium (mmol/L)  Date Value  12/28/2021 134 (L)    Assessment: 26 yo male admitted for treatment of acute epididymitis. PMH includes alcohol abuse, liver cirrhosis, and GERD. Pharmacy consulted to manage electrolytes.    Goal of Therapy:  Electrolytes WNL  Plan:  K 3.8  Mag 1.9  Phos 3.5  Scr 0.41   Ca 8.2  alb 2.6  Corrected Ca 9.3 -Currently on furosemide 40mg  po daily, spironolactone 100mg  daily -Will order Magnesium 1 gm IV x 1  F/u with AM labs.    Chinita Greenland PharmD Clinical Pharmacist 12/28/2021'

## 2021-12-28 NOTE — Progress Notes (Signed)
Cameron Repress, MD [address]  [address]  [place], [place] [address]  Main: [phone]  Fax: [phone] Pager: [phone]   Subjective: Patient reports feeling much better today.  Had low-grade fever last night only.  No more febrile episodes today.  Objective: Vital signs in last 24 hours: Vitals:   12/28/21 0026 12/28/21 0315 12/28/21 0316 12/28/21 1609  BP: (!) 162/78 (!) 145/70  (!) 149/78  Pulse: (!) 108 (!) 104 (!) 102 96  Resp: 20 18  18   Temp: 98.8 F (37.1 C) 100 F (37.8 C)  98.8 F (37.1 C)  TempSrc:  Oral    SpO2: 94% (!) 89% 90% 92%  Weight:      Height:       Weight change:   Intake/Output Summary (Last 24 hours) at 12/28/2021 1656 Last data filed at 12/28/2021 1300 Gross per 24 hour  Intake 545.46 ml  Output --  Net 545.46 ml      Exam: Heart:: Regular rate and rhythm, S1S2 present, or without murmur or extra heart sounds Lungs: normal and clear to auscultation Abdomen: Soft, nontender, diffusely distended, tympanic to percussion Extremities: 1+ edema   Lab Results:    Latest Ref Rng & Units 12/26/2021    5:29 AM 12/25/2021    5:40 AM 12/24/2021    6:25 AM  CBC  WBC 4.0 - 10.5 K/uL 9.2  9.5  10.9   Hemoglobin 13.0 - 17.0 g/dL 13/02/2021  11.9  41.7   Hematocrit 39.0 - 52.0 % 33.9  35.8  35.3   Platelets 150 - 400 K/uL 147  139  147       Latest Ref Rng & Units 12/28/2021    4:23 AM 12/28/2021    4:13 AM 12/27/2021    5:09 AM  CMP  Glucose 70 - 99 mg/dL 13/05/2021   93   BUN 6 - 20 mg/dL <5   <5   Creatinine 144 - 1.24 mg/dL 8.18   5.63   Sodium 1.49 - 145 mmol/L 134   136   Potassium 3.5 - 5.1 mmol/L 3.8   3.7   Chloride 98 - 111 mmol/L 98   102   CO2 22 - 32 mmol/L 26   26   Calcium 8.9 - 10.3 mg/dL 8.2   7.8   Total Protein 6.5 - 8.1 g/dL  7.6  7.3   Total Bilirubin 0.3 - 1.2 mg/dL  702  63.7   Alkaline Phos 38 - 126 U/L  111  110   AST 15 - 41 U/L  82  69   ALT 0 - 44 U/L  18  13     Micro Results: Recent Results (from  the past 240 hour(s))  Chlamydia/NGC rt PCR ([facility] only)     Status: Cameron Santiago   Collection Time: 12/21/21 11:53 PM   Specimen: Urine  Result Value Ref Range Status   Specimen source GC/Chlam CHLAMYDIA SPECIES  Final   Chlamydia Tr NOT DETECTED NOT DETECTED Final   N gonorrhoeae NOT DETECTED NOT DETECTED Final    Comment: (NOTE) This CT/NG assay has not been evaluated in patients with a history of  hysterectomy. Performed at [facility], [address] [place], [place]   Body fluid culture w Gram Stain     Status: Cameron Santiago   Collection Time: 12/23/21  2:25 PM   Specimen: PATH Cytology Peritoneal fluid  Result Value Ref Range Status   Specimen Description  Final    PERITONEAL Performed at [facility], [address] [place], [place] [address]    Special Requests   Final    Cameron Santiago Performed at [facility], [address] [place], [place] [address]    Gram Stain   Final    RARE WBC PRESENT, PREDOMINANTLY MONONUCLEAR NO ORGANISMS SEEN    Culture   Final    NO GROWTH 3 DAYS Performed at [facility], [address] [place], [place] [address]    Report Status 12/27/2021 FINAL  Final  Urine Culture     Status: Cameron Santiago   Collection Time: 12/26/21 10:28 AM   Specimen: Urine, Clean Catch  Result Value Ref Range Status   Specimen Description   Final    URINE, CLEAN CATCH Performed at [facility], [address] [place], [place] [address]    Special Requests   Final    Cameron Santiago Performed at [facility], [address] [place], [place] [address]    Culture   Final    NO GROWTH Performed at [facility], [address] [place], [place] [address]    Report Status 12/27/2021 FINAL  Final  Culture, blood (Routine X 2) w Reflex to ID Panel     Status: Cameron Santiago (Preliminary result)   Collection Time: 12/26/21 10:57 AM   Specimen: BLOOD  Result Value Ref Range Status   Specimen Description BLOOD RIGHT ANTECUBITAL  Final    Special Requests   Final    BOTTLES DRAWN AEROBIC AND ANAEROBIC Blood Culture adequate volume   Culture   Final    NO GROWTH 2 DAYS Performed at [facility], [address], [place], [place] [address]    Report Status PENDING  Incomplete  Culture, blood (Routine X 2) w Reflex to ID Panel     Status: Cameron Santiago (Preliminary result)   Collection Time: 12/26/21 10:57 AM   Specimen: BLOOD  Result Value Ref Range Status   Specimen Description BLOOD BLOOD RIGHT HAND  Final   Special Requests   Final    BOTTLES DRAWN AEROBIC AND ANAEROBIC Blood Culture adequate volume   Culture   Final    NO GROWTH 2 DAYS Performed at [facility], [address] [place], [place] [address]    Report Status PENDING  Incomplete   Studies/Results: MR LIVER W WO CONTRAST  Result Date: 12/27/2021 CLINICAL DATA:  Cirrhosis, liver lesion on CT EXAM: MRI ABDOMEN WITHOUT AND WITH CONTRAST TECHNIQUE: Multiplanar multisequence MR imaging of the abdomen was performed both before and after the administration of intravenous contrast. CONTRAST:  38mL GADAVIST GADOBUTROL 1 MMOL/ML IV SOLN COMPARISON:  CT abdomen/pelvis dated 12/22/2021 FINDINGS: Lower chest: Mild lower lobe atelectasis. Hepatobiliary: Macronodular hepatic contour, supporting the diagnosis of cirrhosis. Severe hepatic steatosis. Lesions on CT correspond to superimposed focal fat deposition (series 5/images 61, 70, 74, and 84) which are even more pronounced than the baseline level of steatosis. No findings suspicious for HCC. No suspicious/enhancing hepatic lesions. No restricted diffusion. Gallbladder wall edema, without gallbladder distension or cholelithiasis. No intrahepatic or extrahepatic duct dilatation. Pancreas:  Within normal limits. Spleen: Enlarged, measuring 17.0 cm in maximal craniocaudal dimension. Adrenals/Urinary Tract:  Adrenal glands are within normal limits. Kidneys are within normal limits.  No hydronephrosis. Stomach/Bowel:  Stomach and visualized bowel are grossly unremarkable. Vascular/Lymphatic: No evidence of abdominal aortic aneurysm. Portal vein is patent. No suspicious abdominal lymphadenopathy. Other:  Very mild upper abdominal/perihepatic ascites. Musculoskeletal: No  focal osseous lesions. IMPRESSION: Findings suggestive of NASH with cirrhosis. Multiple areas of superimposed focal fat deposition, corresponding to the lesions on CT, benign. No findings suspicious for HCC. Splenomegaly. Very mild upper abdominal/perihepatic ascites. Portal vein is patent. Secondary gallbladder wall thickening/edema, without findings suspicious for cholecystitis. Electronically Signed   By: Charline Bills M.D.   On: 12/27/2021 17:49   Medications: I have reviewed the patient's current medications. Prior to Admission:  Medications Prior to Admission  Medication Sig Dispense Refill Last Dose   albuterol (VENTOLIN HFA) 108 (90 Base) MCG/ACT inhaler Inhale 2 puffs into the lungs every 6 (six) hours as needed for wheezing or shortness of breath. 8 g 2 prn   amLODipine (NORVASC) 5 MG tablet Take 1 tablet (5 mg total) by mouth daily. 30 tablet 0    dicyclomine (BENTYL) 10 MG capsule Take 1 capsule (10 mg total) by mouth 3 (three) times daily as needed (abd pain). (Patient not taking: Reported on 12/22/2021) 20 capsule 0 Not Taking   famotidine (PEPCID) 20 MG tablet Take 1 tablet (20 mg total) by mouth 2 (two) times daily. (Patient not taking: Reported on 12/22/2021) 60 tablet 0 Not Taking   ondansetron (ZOFRAN-ODT) 4 MG disintegrating tablet Take 1 tablet (4 mg total) by mouth every 8 (eight) hours as needed for nausea or vomiting. (Patient not taking: Reported on 07/10/2021) 20 tablet 0 Not Taking   sucralfate (CARAFATE) 1 g tablet Take 1 tablet (1 g total) by mouth 4 (four) times daily. (Patient not taking: Reported on 12/22/2021) 20 tablet 0 Not Taking   Scheduled:  amLODipine  5 mg Oral Daily   enoxaparin (LOVENOX) injection  0.5  mg/kg Subcutaneous Q24H   folic acid  1 mg Oral Daily   furosemide  40 mg Oral Daily   morphine  15 mg Oral BID   multivitamin with minerals  1 tablet Oral Daily   pantoprazole  40 mg Oral Daily   polyethylene glycol  34 g Oral Daily   rifaximin  550 mg Oral BID   spironolactone  100 mg Oral Daily   thiamine  100 mg Oral Daily   Or   thiamine  100 mg Intravenous Daily   Continuous:  piperacillin-tazobactam (ZOSYN)  IV 3.375 g (12/28/21 1446)   ZHG:DJMEQASTM, ketorolac, morphine injection, naLOXone (NARCAN)  injection, ondansetron **OR** ondansetron (ZOFRAN) IV Anti-infectives (From admission, onward)    Start     Dose/Rate Route Frequency Ordered Stop   12/27/21 1000  piperacillin-tazobactam (ZOSYN) IVPB 3.375 g        3.375 g 12.5 mL/hr over 240 Minutes Intravenous Every 8 hours 12/27/21 0849     12/25/21 2200  rifaximin (XIFAXAN) tablet 550 mg        550 mg Oral 2 times daily 12/25/21 1731     12/25/21 1000  levofloxacin (LEVAQUIN) IVPB 500 mg  Status:  Discontinued        500 mg 100 mL/hr over 60 Minutes Intravenous Every 24 hours 12/25/21 0834 12/27/21 0830   12/23/21 0000  cefTRIAXone (ROCEPHIN) 2 g in sodium chloride 0.9 % 100 mL IVPB        2 g 200 mL/hr over 30 Minutes Intravenous  Once 12/22/21 1031 12/23/21 0423   12/22/21 2300  doxycycline (VIBRAMYCIN) 100 mg in sodium chloride 0.9 % 250 mL IVPB  Status:  Discontinued        100 mg 125 mL/hr over 120 Minutes Intravenous Every 12 hours 12/22/21 1031 12/25/21 0833  12/22/21 1000  doxycycline (VIBRAMYCIN) 100 mg in sodium chloride 0.9 % 250 mL IVPB        100 mg 125 mL/hr over 120 Minutes Intravenous  Once 12/22/21 0949 12/22/21 1540   12/22/21 0930  levofloxacin (LEVAQUIN) IVPB 500 mg  Status:  Discontinued        500 mg 100 mL/hr over 60 Minutes Intravenous  Once 12/22/21 0924 12/22/21 0949   12/22/21 0730  cefTRIAXone (ROCEPHIN) 2 g in sodium chloride 0.9 % 100 mL IVPB        2 g 200 mL/hr over 30 Minutes  Intravenous  Once 12/22/21 0724 12/22/21 9381      Scheduled Meds:  amLODipine  5 mg Oral Daily   enoxaparin (LOVENOX) injection  0.5 mg/kg Subcutaneous O17P   folic acid  1 mg Oral Daily   furosemide  40 mg Oral Daily   morphine  15 mg Oral BID   multivitamin with minerals  1 tablet Oral Daily   pantoprazole  40 mg Oral Daily   polyethylene glycol  34 g Oral Daily   rifaximin  550 mg Oral BID   spironolactone  100 mg Oral Daily   thiamine  100 mg Oral Daily   Or   thiamine  100 mg Intravenous Daily   Continuous Infusions:  piperacillin-tazobactam (ZOSYN)  IV 3.375 g (12/28/21 1446)   PRN Meds:.albuterol, ketorolac, morphine injection, naLOXone (NARCAN)  injection, ondansetron **OR** ondansetron (ZOFRAN) IV   Assessment: Principal Problem:   Acute epididymitis Active Problems:   Alcohol dependence with withdrawal (HCC)   GERD (gastroesophageal reflux disease)   Hypokalemia   Hypomagnesemia   Hypophosphatemia   Alcoholic cirrhosis of liver with ascites (HCC)   Obesity (BMI 30-39.9)   Elevated bilirubin   Elevated LFTs  JULIES CARMICKLE is a 26 y.o. male with alcohol abuse, history of alcoholic pancreatitis, s/p appendectomy is admitted with decompensated cirrhosis of liver and acute epididymitis   Plan:  Decompensated cirrhosis of liver, secondary to alcohol abuse and severe fatty liver MELD sodium 23, child class C, CPT-11 secondary liver disease work-up has been negative Volume overload with ascites and swelling of legs, s/p paracentesis, no evidence of SBP: Significant improvement in volume status, currently appears euvolemic SAAG> 1.1 consistent with portal hypertension, strict low-sodium diet Continue Lasix 40 mg and spironolactone 100 mg daily, monitor his renal function and electrolytes Discussed with patient regarding low-sodium, low-fat, low-carb diet, dietitian consult placed No evidence of PVT based on CT abdomen pelvis with IV contrast Nonurgent EGD for  variceal screening No evidence of liver lesions Continue MiraLAX 34 g daily and Xifaxan 550 mg twice daily HRS: Cameron Santiago PSE: Cameron Santiago He will need complete abstinence from alcohol use, otherwise prognosis is guarded No indication for prednisone for alcoholic hepatitis   Febrile episodes: Fever curve improving Currently being treated for acute epididymitis Antibiotics switched to Zosyn Blood cultures and urine cultures have showed no growth to date Ultrasound scrotum is pending No evidence of SBP  Hyperbilirubinemia Transaminases including alkaline phosphatase are downtrending T. bili is up trending, mixed pattern, likely component of antibiotic use No evidence of biliary obstruction or any liver lesions based on MRCP Avoid hepatotoxic agents or dose appropriately     Thank you for involving me in the care of this patient.  No further recommendations from GI standpoint at this time.  Please call GI back for questions or concerns       LOS: 6 days   Salaam Battershell 12/28/2021, 4:56  PM

## 2021-12-29 LAB — BASIC METABOLIC PANEL
Anion gap: 9 (ref 5–15)
BUN: <5 mg/dL — ABNORMAL LOW (ref 6–20)
CO2: 29 mmol/L (ref 22–32)
Calcium: 8.1 mg/dL — ABNORMAL LOW (ref 8.9–10.3)
Chloride: 97 mmol/L — ABNORMAL LOW (ref 98–111)
Creatinine, Ser: 0.41 mg/dL — ABNORMAL LOW (ref 0.61–1.24)
GFR, Estimated: >60 mL/min (ref >60)
Glucose, Bld: 94 mg/dL (ref 70–99)
Potassium: 3.6 mmol/L (ref 3.5–5.1)
Sodium: 135 mmol/L (ref 135–145)

## 2021-12-29 LAB — HEPATIC FUNCTION PANEL
ALT: 17 U/L (ref 0–44)
AST: 83 U/L — ABNORMAL HIGH (ref 15–41)
Albumin: 2.4 g/dL — ABNORMAL LOW (ref 3.5–5.0)
Alkaline Phosphatase: 97 U/L (ref 38–126)
Bilirubin, Direct: 7.7 mg/dL — ABNORMAL HIGH (ref 0.0–0.2)
Indirect Bilirubin: 5.3 mg/dL — ABNORMAL HIGH (ref 0.3–0.9)
Total Bilirubin: 13 mg/dL — ABNORMAL HIGH (ref 0.3–1.2)
Total Protein: 7 g/dL (ref 6.5–8.1)

## 2021-12-29 LAB — MAGNESIUM: Magnesium: 1.7 mg/dL (ref 1.7–2.4)

## 2021-12-29 LAB — PROTIME-INR
INR: 1.7 — ABNORMAL HIGH (ref 0.8–1.2)
Prothrombin Time: 19.5 seconds — ABNORMAL HIGH (ref 11.4–15.2)

## 2021-12-29 MED ORDER — MAGNESIUM SULFATE 2 GM/50ML IV SOLN
2.0000 g | Freq: Once | INTRAVENOUS | Status: AC
  Administered 2021-12-29: 2 g via INTRAVENOUS
  Filled 2021-12-29: qty 50

## 2021-12-29 MED ORDER — MELATONIN 5 MG PO TABS
2.5000 mg | ORAL_TABLET | Freq: Every day | ORAL | Status: DC
  Administered 2021-12-29 – 2021-12-31 (×3): 2.5 mg via ORAL
  Filled 2021-12-29 (×3): qty 1

## 2021-12-29 MED ORDER — ORAL CARE MOUTH RINSE: 15.0000 mL | OROMUCOSAL | Status: DC | PRN

## 2021-12-29 NOTE — Progress Notes (Signed)
Nutrition Brief Note  RD consulted for assessment of nutritional requirements/ status and diet education.   Wt Readings from Last 15 Encounters:  12/22/21 122.4 kg  10/08/21 104.3 kg  07/10/21 104.3 kg  05/26/21 104.3 kg  11/20/20 109.4 kg  11/21/19 108.9 kg  12/31/18 90.7 kg  12/15/15 99.8 kg (97 %, Z= 1.90)*  08/14/15 90.7 kg (93 %, Z= 1.49)*  02/14/15 86.8 kg (91 %, Z= 1.33)*   * Growth percentiles are based on CDC (Boys, 2-20 Years) data.   Pt with PMH significant for alcohol abuse, alcoholic liver cirrhosis, GERD who presents for evaluation of abdominal pain as well as testicular pain.   Pt admitted with acute epididymitis and alcoholic cirrhosis of liver with ascites.    Pt lying in bed at time of visit. He did not respond to name being called.   Observed breakfast tray- pt consumed 100%.   RD provided "Low Sodium Nutrition Therapy" handout from [facility] Nutrition Care Manual; attached to AVS/ discharge summary.   Nutrition-Focused physical exam completed. Findings are no fat depletion, no muscle depletion, and mild edema.    Lab Results  Component Value Date   HGBA1C 5.2 12/28/2021   PTA DM medications are none.   Labs reviewed.   Body mass index is 37.63 kg/m. Patient meets criteria for obesity, class II based on current BMI. Obesity is a complex, chronic medical condition that is optimally managed by a multidisciplinary care team. Weight loss is not an ideal goal for an acute inpatient hospitalization. However, if further work-up for obesity is warranted, consider outpatient referral to outpatient bariatric service and/or [facility].    Current diet order is 2 gram sodium, patient is consuming approximately 75-100% of meals at this time. Labs and medications reviewed.   No nutrition interventions warranted at this time. If nutrition issues arise, please consult RD.   Loistine Chance, RD, LDN, [place] Registered Dietitian  II Certified Diabetes Care and Education Specialist Please refer to [facility] for RD and/or RD on-call/weekend/after hours pager

## 2021-12-29 NOTE — Discharge Instructions (Addendum)
Low Sodium Nutrition Therapy  Eating less sodium can help you if you have high blood pressure, heart failure, or kidney or liver disease.   Your body needs a little sodium, but too much sodium can cause your body to hold onto extra water. This extra water will raise your blood pressure and can cause damage to your heart, kidneys, or liver as they are forced to work harder.   Sometimes you can see how the extra fluid affects you because your hands, legs, or belly swell. You may also hold water around your heart and lungs, which makes it hard to breathe.   Even if you take medication for blood pressure or a water pill (diuretic) to remove fluid, it is still important to have less salt in your diet.   Check with your primary care provider before drinking alcohol since it may affect the amount of fluid in your body and how your heart, kidneys, or liver work. Sodium in Food A low-sodium meal plan limits the sodium that you get from food and beverages to 1,500-2,000 milligrams (mg) per day. Salt is the main source of sodium. Read the nutrition label on the package to find out how much sodium is in one serving of a food.  Select foods with 140 milligrams (mg) of sodium or less per serving.  You may be able to eat one or two servings of foods with a little more than 140 milligrams (mg) of sodium if you are closely watching how much sodium you eat in a day.  Check the serving size on the label. The amount of sodium listed on the label shows the amount in one serving of the food. So, if you eat more than one serving, you will get more sodium than the amount listed.  Tips Cutting Back on Sodium Eat more fresh foods.  Fresh fruits and vegetables are low in sodium, as well as frozen vegetables and fruits that have no added juices or sauces.  Fresh meats are lower in sodium than processed meats, such as bacon, sausage, and hotdogs.  Not all processed foods are unhealthy, but some processed foods may have too  much sodium.  Eat less salt at the table and when cooking. One of the ingredients in salt is sodium.  One teaspoon of table salt has 2,300 milligrams of sodium.  Leave the salt out of recipes for pasta, casseroles, and soups. Be a Engineer, building services.  Food packages that say "Salt-free", sodium-free", "very low sodium," and "low sodium" have less than 140 milligrams of sodium per serving.  Beware of products identified as "Unsalted," "No Salt Added," "Reduced Sodium," or "Lower Sodium." These items may still be high in sodium. You should always check the nutrition label. Add flavors to your food without adding sodium.  Try lemon juice, lime juice, or vinegar.  Dry or fresh herbs add flavor.  Buy a sodium-free seasoning blend or make your own at home. You can purchase salt-free or sodium-free condiments like barbeque sauce in stores and online. Ask your registered dietitian nutritionist for recommendations and where to find them.   Eating in Restaurants Choose foods carefully when you eat outside your home. Restaurant foods can be very high in sodium. Many restaurants provide nutrition facts on their menus or their websites. If you cannot find that information, ask your server. Let your server know that you want your food to be cooked without salt and that you would like your salad dressing and sauces to be served on the  side.    Foods Recommended Food Group Foods Recommended  Grains Bread, bagels, rolls without salted tops Homemade bread made with reduced-sodium baking powder Cold cereals, especially shredded wheat and puffed rice Oats, grits, or cream of wheat Pastas, quinoa, and rice Popcorn, pretzels or crackers without salt Corn tortillas  Protein Foods Fresh meats and fish; [place] bacon (check the nutrition labels - make sure they are not packaged in a sodium solution) Canned or packed tuna (no more than 4 ounces at 1 serving) Beans and peas Soybeans) and tofu Eggs Nuts or nut butters  without salt  Dairy Milk or milk powder Plant milks, such as rice and soy Yogurt, including Greek yogurt Small amounts of natural cheese (blocks of cheese) or reduced-sodium cheese can be used in moderation. (Swiss, ricotta, and fresh mozzarella cheese are lower in sodium than the others) Cream Cheese Low sodium cottage cheese  Vegetables Fresh and frozen vegetables without added sauces or salt Homemade soups (without salt) Low-sodium, salt-free or sodium-free canned vegetables and soups  Fruit Fresh and canned fruits Dried fruits, such as raisins, cranberries, and prunes  Oils Tub or liquid margarine, regular or without salt Canola, corn, peanut, olive, safflower, or sunflower oils  Condiments Fresh or dried herbs such as basil, bay leaf, dill, mustard (dry), nutmeg, paprika, parsley, rosemary, sage, or thyme.  Low sodium ketchup Vinegar  Lemon or lime juice Pepper, red pepper flakes, and cayenne. Hot sauce contains sodium, but if you use just a drop or two, it will not add up to much.  Salt-free or sodium-free seasoning mixes and marinades Simple salad dressings: vinegar and oil   Foods Not Recommended Food Group Foods Not Recommended  Grains Breads or crackers topped with salt Cereals (hot/cold) with more than 300 mg sodium per serving Biscuits, cornbread, and other "quick" breads prepared with baking soda Pre-packaged bread crumbs Seasoned and packaged rice and pasta mixes Self-rising flours  Protein Foods Cured meats: Bacon, ham, sausage, pepperoni and hot dogs Canned meats (chili, vienna sausage, or sardines) Smoked fish and meats Frozen meals that have more than 600 mg of sodium per serving Egg substitute (with added sodium)  Dairy Buttermilk Processed cheese spreads Cottage cheese (1 cup may have over 500 mg of sodium; look for low-sodium.) American or feta cheese Shredded Cheese has more sodium than blocks of cheese String cheese  Vegetables Canned vegetables  (unless they are salt-free, sodium-free or low sodium) Frozen vegetables with seasoning and sauces Sauerkraut and pickled vegetables Canned or dried soups (unless they are salt-free, sodium-free, or low sodium) [place] fries and onion rings  Fruit Dried fruits preserved with additives that have sodium  Oils Salted butter or margarine, all types of olives  Condiments Salt, sea salt, kosher salt, onion salt, and garlic salt Seasoning mixes with salt Bouillon cubes Ketchup Barbeque sauce and Worcestershire sauce unless low sodium Soy sauce Salsa, pickles, olives, relish Salad dressings: ranch, blue cheese, [place], and [place].   Low Sodium Sample 1-Day Menu  Breakfast 1 cup cooked oatmeal  1 slice whole wheat bread toast  1 tablespoon peanut butter without salt  1 banana  1 cup 1% milk  Lunch Tacos made with: 2 corn tortillas   cup black beans, low sodium   cup roasted or grilled chicken (without skin)   avocado  Squeeze of lime juice  1 cup salad greens  1 tablespoon low-sodium salad dressing   cup strawberries  1 orange  Afternoon Snack 1/3 cup grapes  6 ounces yogurt  Evening Meal 3 ounces herb-baked fish  1 baked potato  2 teaspoons olive oil   cup cooked carrots  2 thick slices tomatoes on:  2 lettuce leaves  1 teaspoon olive oil  1 teaspoon balsamic vinegar  1 cup 1% milk  Evening Snack 1 apple   cup almonds without salt   Low-Sodium Vegetarian (Lacto-Ovo) Sample 1-Day Menu  Breakfast 1 cup cooked oatmeal  1 slice whole wheat toast  1 tablespoon peanut butter without salt  1 banana  1 cup 1% milk  Lunch Tacos made with: 2 corn tortillas   cup black beans, low sodium   cup roasted or grilled chicken (without skin)   avocado  Squeeze of lime juice  1 cup salad greens  1 tablespoon low-sodium salad dressing   cup strawberries  1 orange  Evening Meal Stir fry made with:  cup tofu  1 cup brown rice   cup broccoli   cup green beans   cup  peppers   tablespoon peanut oil  1 orange  1 cup 1% milk  Evening Snack 4 strips celery  2 tablespoons hummus  1 hard-boiled egg   Low-Sodium Vegan Sample 1-Day Menu  Breakfast 1 cup cooked oatmeal  1 tablespoon peanut butter without salt  1 cup blueberries  1 cup soymilk fortified with calcium, vitamin B12, and vitamin D  Lunch 1 small whole wheat pita   cup cooked lentils  2 tablespoons hummus  4 carrot sticks  1 medium apple  1 cup soymilk fortified with calcium, vitamin B12, and vitamin D  Evening Meal Stir fry made with:  cup tofu  1 cup brown rice   cup broccoli   cup green beans   cup peppers   tablespoon peanut oil  1 cup cantaloupe  Evening Snack 1 cup soy yogurt   cup mixed nuts  Copyright 2020  Academy of Nutrition and Dietetics. All rights reserved  Sodium Free Flavoring Tips  When cooking, the following items may be used for flavoring instead of salt or seasonings that contain sodium. Remember: A little bit of spice goes a long way! Be careful not to overseason. Spice Blend Recipe (makes about ? cup) 5 teaspoons onion powder  2 teaspoons garlic powder  2 teaspoons paprika  2 teaspoon dry mustard  1 teaspoon crushed thyme leaves   teaspoon white pepper   teaspoon celery seed Food Item Flavorings  Beef Basil, bay leaf, caraway, curry, dill, dry mustard, garlic, grape jelly, green pepper, mace, marjoram, mushrooms (fresh), nutmeg, onion or onion powder, parsley, pepper, rosemary, sage  Chicken Basil, cloves, cranberries, mace, mushrooms (fresh), nutmeg, oregano, paprika, parsley, pineapple, saffron, sage, savory, tarragon, thyme, tomato, turmeric  Egg Chervil, curry, dill, dry mustard, garlic or garlic powder, green pepper, jelly, mushrooms (fresh), nutmeg, onion powder, paprika, parsley, rosemary, tarragon, tomato  Fish Basil, bay leaf, chervil, curry, dill, dry mustard, green pepper, lemon juice, marjoram, mushrooms (fresh), paprika, pepper,  tarragon, tomato, turmeric  Lamb Cloves, curry, dill, garlic or garlic powder, mace, mint, mint jelly, onion, oregano, parsley, pineapple, rosemary, tarragon, thyme  Pork Applesauce, basil, caraway, chives, cloves, garlic or garlic powder, onion or onion powder, rosemary, thyme  Veal Apricots, basil, bay leaf, currant jelly, curry, ginger, marjoram, mushrooms (fresh), oregano, paprika  Vegetables Basil, dill, garlic or garlic powder, ginger, lemon juice, mace, marjoram, nutmeg, onion or onion powder, tarragon, tomato, sugar or sugar substitute, salt-free salad dressing, vinegar  Desserts Allspice, anise, cinnamon, cloves, ginger, mace, nutmeg, vanilla extract, other  extracts   Copyright 2020  Academy of Nutrition and Dietetics. All rights reserved         Intensive Outpatient Programs   [facility] [address] [place],  [place], [place] [phone]  [facility] (Inpatient and [phone] (Suboxone and Methadone) [address] [phone]  [facility]: [facility] Programs - Intensive Outpatient [address] [place], [place] [address]  [phone]  [facility] (Outpatient, Inpatient, [facility] (Groups and Residental) (insurance only) [phone] [place], [place] [phone]   [facility] (for caregivers and family) [address] Laurell Josephs [address], [place], [place] [phone]  Residential Treatment Programs  [facility](2 years) Residential: 44 days)[facility] [address] [place], [place], [place] [phone] or [phone]  [facility] [address] [place], [place], [place] [phone]  [facility] ([facility] [address] [place], [place], [place] [phone] Admissions: 8am-3pm M-F  [facility] Program: Residential Program [phone] Days)             [facility]: [facility]  [place], [place], [place]  [phone] or [phone] in Hours over the weekend or by referral)   [facility] Crisis: [facility]:[phone] (for crisis response 24 hours a day)

## 2021-12-29 NOTE — Consult Note (Signed)
PHARMACY CONSULT NOTE - FOLLOW UP  Pharmacy Consult for Electrolyte Monitoring and Replacement   Recent Labs: Potassium (mmol/L)  Date Value  12/29/2021 3.6   Magnesium (mg/dL)  Date Value  12/29/2021 1.7   Calcium (mg/dL)  Date Value  12/29/2021 8.1 (L)   Albumin (g/dL)  Date Value  12/29/2021 2.4 (L)   Phosphorus (mg/dL)  Date Value  12/28/2021 3.5   Sodium (mmol/L)  Date Value  12/29/2021 135    Assessment: Cameron Santiago is a 26 y.o. male presenting with acute epididymitis. PMH significant for alcohol abuse, liver cirrhosis, GERD. Pharmacy has been consulted to manage electrolytes.    Goal of Therapy:  Electrolytes WNL  Plan:  K 3.6, Mag 1.7, Scr 0.41   Ca 8.1, Alb 2.4, Corrected Ca 9.4 Currently on furosemide 40mg  PO daily, spironolactone 100mg  daily Electrolytes currently WNL, will replace Mg 2g IV x1 today since downtrending from 1.9 >> 1.7 in past 24h Follow up BMP & Mg with AM labs. Follow up Phos later this week.  Gretel Acre, PharmD PGY1 Pharmacy Resident 12/29/2021 10:51 AM

## 2021-12-29 NOTE — Progress Notes (Signed)
Progress Note   Patient: Cameron Santiago ACZ:[id number] DOB: 1995-09-06 DOA: 12/22/2021     7 DOS: the patient was seen and examined on 12/29/2021   Brief hospital course: Taken from prior notes.   26 years old male with PMH significant for alcohol abuse, alcoholic liver cirrhosis, GERD who presents to the ER for evaluation of abdominal pain as well as testicular pain. Patient states symptoms started 3 days ago and is associated with nausea and vomiting.He complains of lower testicular pain but denies having any penile discharge.  He is currently not sexually active.  Work-up in the ED showed hypokalemia, hypomagnesemia GC and chlamydia cultures negative.  CT abdomen showed morphological features of liver cirrhosis, including hepatomegaly, irregular liver contour and severe hepatic steatosis.  Testicular ultrasound showed finding consistent with acute epididymitis.  Patient is admitted for further evaluation.  He is started on ceftriaxone and doxycycline.  Urology was consulted, recommended to continue antibiotics, adequate pain control, scrotal support, compressive underwear, cryotherapy as needed.  11/1: Patient continued to have significant pain.  Multiple electrolyte abnormalities which include hypomagnesemia, hypophosphatemia and hypokalemia which are being repleted. Gonorrhea and chlamydia PCR negative, HIV negative.  Ordered RPR. Patient also has paracentesis done yesterday with removal of only 1 mL of fluid, apparently there was no significant ascites.  Preliminary cultures negative. CIWA score of 4.  Counseling was provided for alcohol cessation.  11/2: Patient had another spike of fever up to 101.5 overnight.,Antibiotics switched to Levaquin. Blood pressure remained elevated-restarting home amlodipine. Potassium and corrected calcium remained low-being repleted. Worsening T. bili at 9.8 and INR 1.7-GI was consulted. Multiple labs to start the work-up of this new diagnosis of liver  cirrhosis especially at this age was ordered by GI. Ordered RUQ [place] for worsening abdominal distension.  11/3: Some improvement in abdominal distention after having 2-3 bowel movements. Right upper quadrant ultrasound with trace ascites only. GI switched him to MiraLAX due to concern of bloating with lactulose. GI also added low-dose Lasix, done and rifaximin. Labs for secondary causes of liver cirrhosis with negative ANA negative anti-smooth muscle antibody, mitochondrial antibody, phenotypes microsomal antibody.  Pending alpha 1 antitrypsin, ceruloplasmin, celiac disease panel and B1. Became febrile again despite change to antibiotic.  Sending blood and urine cultures to rule out any resistant bacteria.Marland Kitchen  11/4: Patient remained febrile, continued to have significant abdominal pain.  Switching antibiotics to Zosyn while waiting for urine and blood cultures.  Ordered repeat scrotal ultrasound  to rule out any abscesses.  Ordered an MRI of liver as advised on CT abdomen for further characterization of his liver lesion as he has deteriorating liver function. Worsening T. bili and INR.  11/5: Patient continued to have a low-grade fever.  Liver MRI with concern of Elita Boone with cirrhosis, focal fat deposits and no evidence of HCC.  Gallbladder wall thickening with no cholecystitis.  Very mild ascites.  Blood and urine cultures remain negative. MELD sodium 23, child class C, worsening T. bili with slight improvement in INR secondary liver disease work-up with mildly elevated ceruloplasmin 31.9.  Celiac disease panel with elevated IgA at 767.  Rest of the work-up negative. Alpha 1 antitrypsin and B1 levels pending. GI increase the dose of Lasix to 40 and spironolactone to 100.  11/6: Clinically feeling better with less abdominal pain.  Hepatic functions deteriorated but seems stable with INR of 1.7 and T. bili of 13.  Remained afebrile over the past 24 hours. We will continue with Zosyn for another  day.   Assessment and Plan: * Acute epididymitis Patient presents for evaluation of left testicular pain Testicle ultrasound is concerning for acute epididymitis Consult urology-recommending continuation of antibiotics, scrotal support, and cryotherapy as needed. HIV, chlamydia and gonorrhea PCR negative. Continue to have fever he received doxycycline and ceftriaxone followed by Levaquin, currently on Zosyn -Continue Zosyn -Repeat scrotal ultrasound to rule out any abscesses-apparently patient refused Urine and blood cultures negative .-Continue with supportive care  Alcoholic cirrhosis of liver with ascites (HCC) Worsening function with worsening T. bili and INR with small improvement with Patient with a history of alcohol abuse who presents for evaluation of diffuse abdominal pain associated with nausea and vomiting. CT scan of abdomen and pelvis shows morphologic features of the liver concerning for cirrhosis, including: marked hepatomegaly, irregular liver contour and severe hepatic steatosis with stigmata of portal venous hypertension noted including varices, splenomegaly and ascites which appears to be new. Paracentesis was ordered with removal of only 1 mL of fluid, apparently there was not much fluid present-preliminary cultures negative. -Patient was counseled for alcohol cessation. -GI was consulted  due to worsening T. bili and INR-ordered multiple labs to rule out other possible etiology for liver dysfunction, mostly negative so far and few pending. -Right upper quadrant repeat ultrasound with trace ascites only. -Started on  Lasix, rifaximin and spironolactone -Lactulose switched with MiraLAX due to concern of bloating -Liver MRI-confirms cirrhosis, no concern of HCC, mild ascites -Need to follow-up at liver center in a tertiary care setting.  Alcohol dependence with withdrawal (HCC) Patient admits to daily alcohol use with symptoms of alcohol withdrawal when he does not  drink CIWA score of 4 -Continue with CIWA protocol -Continue with supportive care and supplements  Hypokalemia Potassium at 3.8. -Monitor potassium and replete as needed  Hypomagnesemia Magnesium 1.6. -Replace magnesium and monitor  Hypophosphatemia Phosphorous of 2.5, improved. -Replace phosphorus as needed and monitor  GERD (gastroesophageal reflux disease) Place patient on IV PPI  Obesity (BMI 30-39.9) Estimated body mass index is 37.63 kg/m as calculated from the following:   Height as of this encounter: 5\' 11"  (1.803 m).   Weight as of this encounter: 122.4 kg.   -Encouraged weight loss -Will complicate overall prognosis   Subjective: Patient with improved abdominal pain.  No fever.  Asking about discharge  Physical Exam: Vitals:   12/28/21 1940 12/29/21 0423 12/29/21 0851 12/29/21 1226  BP: (!) 149/87 132/83 136/80 134/84  Pulse: (!) 101 95 87 96  Resp: 14 14 16 18   Temp: 99.5 F (37.5 C) 99.2 F (37.3 C) 99.1 F (37.3 C) 98.1 F (36.7 C)  TempSrc: Oral     SpO2: 93% 93% 94% 92%  Weight:      Height:      General.  Obese young adult, in no acute distress. Pulmonary.  Lungs clear bilaterally, normal respiratory effort. CV.  Regular rate and rhythm, no JVD, rub or murmur. Abdomen.  Soft, nontender, nondistended, BS positive. CNS.  Alert and oriented .  No focal neurologic deficit. Extremities.  No edema, no cyanosis, pulses intact and symmetrical. Psychiatry.  Judgment and insight appears normal.   Data Reviewed: Prior data reviewed  Family Communication: Discussed with patient  Disposition: Status is: Inpatient Remains inpatient appropriate because: Severity of illness   Planned Discharge Destination: Home  Time spent: 45 minutes  This record has been created using 13/06/23. Errors have been sought and corrected,but may not always be located. Such creation errors do not  reflect on the standard of  care.  Author: Lorella Nimrod, MD 12/29/2021 3:31 PM  For on call review [email or web address].

## 2021-12-30 LAB — PROTIME-INR
INR: 1.6 — ABNORMAL HIGH (ref 0.8–1.2)
Prothrombin Time: 19.3 seconds — ABNORMAL HIGH (ref 11.4–15.2)

## 2021-12-30 LAB — COMPREHENSIVE METABOLIC PANEL
ALT: 19 U/L (ref 0–44)
AST: 85 U/L — ABNORMAL HIGH (ref 15–41)
Albumin: 2.5 g/dL — ABNORMAL LOW (ref 3.5–5.0)
Alkaline Phosphatase: 100 U/L (ref 38–126)
Anion gap: 10 (ref 5–15)
BUN: <5 mg/dL — ABNORMAL LOW (ref 6–20)
CO2: 28 mmol/L (ref 22–32)
Calcium: 8.1 mg/dL — ABNORMAL LOW (ref 8.9–10.3)
Chloride: 98 mmol/L (ref 98–111)
Creatinine, Ser: 0.33 mg/dL — ABNORMAL LOW (ref 0.61–1.24)
GFR, Estimated: >60 mL/min (ref >60)
Glucose, Bld: 95 mg/dL (ref 70–99)
Potassium: 3.7 mmol/L (ref 3.5–5.1)
Sodium: 136 mmol/L (ref 135–145)
Total Bilirubin: 13.7 mg/dL — ABNORMAL HIGH (ref 0.3–1.2)
Total Protein: 7.4 g/dL (ref 6.5–8.1)

## 2021-12-30 LAB — CBC
HCT: 37.2 % — ABNORMAL LOW (ref 39.0–52.0)
Hemoglobin: 13 g/dL (ref 13.0–17.0)
MCH: 35 pg — ABNORMAL HIGH (ref 26.0–34.0)
MCHC: 34.9 g/dL (ref 30.0–36.0)
MCV: 100.3 fL — ABNORMAL HIGH (ref 80.0–100.0)
Platelets: 280 10*3/uL (ref 150–400)
RBC: 3.71 MIL/uL — ABNORMAL LOW (ref 4.22–5.81)
RDW: 19.9 % — ABNORMAL HIGH (ref 11.5–15.5)
WBC: 11.8 10*3/uL — ABNORMAL HIGH (ref 4.0–10.5)
nRBC: 0.9 % — ABNORMAL HIGH (ref 0.0–0.2)

## 2021-12-30 LAB — MAGNESIUM: Magnesium: 1.7 mg/dL (ref 1.7–2.4)

## 2021-12-30 MED ORDER — LACTATED RINGERS IV SOLN: INTRAVENOUS | Status: AC

## 2021-12-30 NOTE — Progress Notes (Signed)
Progress Note   Patient: Cameron Santiago [id number] DOB: 05-05-1995 DOA: 12/22/2021     8 DOS: the patient was seen and examined on 12/30/2021   Brief hospital course: Taken from prior notes.   26 years old male with PMH significant for alcohol abuse, alcoholic liver cirrhosis, GERD who presents to the ER for evaluation of abdominal pain as well as testicular pain. Patient states symptoms started 3 days ago and is associated with nausea and vomiting.He complains of lower testicular pain but denies having any penile discharge.  He is currently not sexually active.  Work-up in the ED showed hypokalemia, hypomagnesemia GC and chlamydia cultures negative.  CT abdomen showed morphological features of liver cirrhosis, including hepatomegaly, irregular liver contour and severe hepatic steatosis.  Testicular ultrasound showed finding consistent with acute epididymitis.  Patient is admitted for further evaluation.  He is started on ceftriaxone and doxycycline.  Urology was consulted, recommended to continue antibiotics, adequate pain control, scrotal support, compressive underwear, cryotherapy as needed.  11/1: Patient continued to have significant pain.  Multiple electrolyte abnormalities which include hypomagnesemia, hypophosphatemia and hypokalemia which are being repleted. Gonorrhea and chlamydia PCR negative, HIV negative.  Ordered RPR. Patient also has paracentesis done yesterday with removal of only 1 mL of fluid, apparently there was no significant ascites.  Preliminary cultures negative. CIWA score of 4.  Counseling was provided for alcohol cessation.  11/2: Patient had another spike of fever up to 101.5 overnight.,Antibiotics switched to Levaquin. Blood pressure remained elevated-restarting home amlodipine. Potassium and corrected calcium remained low-being repleted. Worsening T. bili at 9.8 and INR 1.7-GI was consulted. Multiple labs to start the work-up of this new diagnosis of liver  cirrhosis especially at this age was ordered by GI. Ordered RUQ [place] for worsening abdominal distension.  11/3: Some improvement in abdominal distention after having 2-3 bowel movements. Right upper quadrant ultrasound with trace ascites only. GI switched him to MiraLAX due to concern of bloating with lactulose. GI also added low-dose Lasix, done and rifaximin. Labs for secondary causes of liver cirrhosis with negative ANA negative anti-smooth muscle antibody, mitochondrial antibody, phenotypes microsomal antibody.  Pending alpha 1 antitrypsin, ceruloplasmin, celiac disease panel and B1. Became febrile again despite change to antibiotic.  Sending blood and urine cultures to rule out any resistant bacteria.Marland Kitchen  11/4: Patient remained febrile, continued to have significant abdominal pain.  Switching antibiotics to Zosyn while waiting for urine and blood cultures.  Ordered repeat scrotal ultrasound  to rule out any abscesses.  Ordered an MRI of liver as advised on CT abdomen for further characterization of his liver lesion as he has deteriorating liver function. Worsening T. bili and INR.  11/5: Patient continued to have a low-grade fever.  Liver MRI with concern of Karlene Lineman with cirrhosis, focal fat deposits and no evidence of HCC.  Gallbladder wall thickening with no cholecystitis.  Very mild ascites.  Blood and urine cultures remain negative. MELD sodium 23, child class C, worsening T. bili with slight improvement in INR secondary liver disease work-up with mildly elevated ceruloplasmin 31.9.  Celiac disease panel with elevated IgA at 767.  Rest of the work-up negative. Alpha 1 antitrypsin and B1 levels pending. GI increase the dose of Lasix to 40 and spironolactone to 100.  11/6: Clinically feeling better with less abdominal pain.  Hepatic functions deteriorated but seems stable with INR of 1.7 and T. bili of 13.  Remained afebrile over the past 24 hours. We will continue with Zosyn for another  day.  11/7: Maximum temperature of 99.9 over the past 24 hours, INR at 1.6 with slight worsening of T. bili at 13.7, little worsening of leukocytosis at 11.8 but all cell lines decreased might be hemoconcentration with diuretics-giving some IV fluid. GI wants to keep him in the hospital until T. bili started trending down. Will remain on Zosyn.   Assessment and Plan: * Acute epididymitis Fever curve started improving on Zosyn, Patient presents for evaluation of left testicular pain Testicle ultrasound is concerning for acute epididymitis Consult urology-recommending continuation of antibiotics, scrotal support, and cryotherapy as needed. HIV, chlamydia and gonorrhea PCR negative.  he received doxycycline and ceftriaxone followed by Levaquin, currently on Zosyn -Continue Zosyn-continue 2 more days -Repeat scrotal ultrasound to rule out any abscesses-apparently patient refused Urine and blood cultures negative .-Continue with supportive care  Alcoholic cirrhosis of liver with ascites ([place]) Worsening function with worsening T. bili and INR with small improvement with Patient with a history of alcohol abuse who presents for evaluation of diffuse abdominal pain associated with nausea and vomiting. CT scan of abdomen and pelvis shows morphologic features of the liver concerning for cirrhosis, including: marked hepatomegaly, irregular liver contour and severe hepatic steatosis with stigmata of portal venous hypertension noted including varices, splenomegaly and ascites which appears to be new. Paracentesis was ordered with removal of only 1 mL of fluid, apparently there was not much fluid present-preliminary cultures negative. -Patient was counseled for alcohol cessation. -GI was consulted  due to worsening T. bili and INR-ordered multiple labs to rule out other possible etiology for liver dysfunction, mostly negative so far and few pending. -Right upper quadrant repeat ultrasound with trace  ascites only. -Started on  Lasix, rifaximin and spironolactone -Lactulose switched with MiraLAX due to concern of bloating -Liver MRI-confirms cirrhosis, no concern of HCC, mild ascites -GI wants to see downward trend in T. bili and INR before discharge -Need to follow-up at liver center in a tertiary care setting.  Alcohol dependence with withdrawal ([place]) Patient admits to daily alcohol use with symptoms of alcohol withdrawal when he does not drink CIWA score of 4 -Continue with CIWA protocol -Continue with supportive care and supplements  Hypokalemia Potassium at 3.8. -Monitor potassium and replete as needed  Hypomagnesemia Magnesium 1.6. -Replace magnesium and monitor  Hypophosphatemia Phosphorous of 2.5, improved. -Replace phosphorus as needed and monitor  GERD (gastroesophageal reflux disease) Place patient on IV PPI  Obesity (BMI 30-39.9) Estimated body mass index is 37.63 kg/m as calculated from the following:   Height as of this encounter: 5\' 11"  (1.803 m).   Weight as of this encounter: 122.4 kg.   -Encouraged weight loss -Will complicate overall prognosis   Subjective: Patient is feeling improved this morning.  He was happy that he did not had any fever spike overnight.  Physical Exam: Vitals:   12/29/21 1702 12/30/21 0010 12/30/21 0712 12/30/21 1546  BP: (!) 147/89 137/82 139/81 (!) 143/77  Pulse: 96 98 93 83  Resp: 18 20 16    Temp: 98.6 F (37 C) 99.9 F (37.7 C) 99 F (37.2 C) 98.6 F (37 C)  TempSrc:      SpO2: 94% 91% 93% 95%  Weight:      Height:      General.  Obese gentleman, in no acute distress. Pulmonary.  Lungs clear bilaterally, normal respiratory effort. CV.  Regular rate and rhythm, no JVD, rub or murmur. Abdomen.  Soft, nontender, nondistended, BS positive. CNS.  Alert and oriented .  No focal neurologic deficit. Extremities.  No edema, no cyanosis, pulses intact and symmetrical. Psychiatry.  Judgment and insight appears normal.    Data Reviewed: Prior data reviewed  Family Communication: Discussed with a friend and patient  Disposition: Status is: Inpatient Remains inpatient appropriate because: Severity of illness   Planned Discharge Destination: Home  Time spent: 44 minutes  This record has been created using Systems analyst. Errors have been sought and corrected,but may not always be located. Such creation errors do not reflect on the standard of care.  Author: Lorella Nimrod, MD 12/30/2021 4:01 PM  For on call review [email or web address].

## 2021-12-30 NOTE — Consult Note (Addendum)
PHARMACY CONSULT NOTE - FOLLOW UP  Pharmacy Consult for Electrolyte Monitoring and Replacement   Recent Labs: Potassium (mmol/L)  Date Value  12/30/2021 3.7   Magnesium (mg/dL)  Date Value  12/30/2021 1.7   Calcium (mg/dL)  Date Value  12/30/2021 8.1 (L)   Albumin (g/dL)  Date Value  12/30/2021 2.5 (L)   Phosphorus (mg/dL)  Date Value  12/28/2021 3.5   Sodium (mmol/L)  Date Value  12/30/2021 136   Corrected calcium: 9.38 mg/dL  Assessment: Cameron Santiago is a 26 y.o. male presenting with acute epididymitis. PMH significant for alcohol abuse, liver cirrhosis, GERD. Pharmacy has been consulted to manage electrolytes.    Continues on furosemide 40mg  PO daily and spironolactone 100mg  daily. On thin liquid diet. Eating 100% of meals (11/6).  Goal of Therapy:  Electrolytes WNL  Plan:  Electrolytes continue to be WNL. Magnesium stable after replacement 11/6. Pharmacy will sign off of electrolyte consult.    Glean Salvo, PharmD Clinical Pharmacist  12/30/2021 7:16 AM

## 2021-12-30 NOTE — Progress Notes (Signed)
Walked around nurse station the afternoon. Patient requested shower earlier in pm shift with assistance of girlfriend at bedside. Showered without difficulty. This a.m. patient stated numbness in his thighs, but was able to ambulate to the restroom.

## 2021-12-30 NOTE — TOC Progression Note (Signed)
Transition of Care [facility]) - Progression Note    Patient Details  Name: Cameron Santiago MRN: [id number] Date of Birth: 1995-06-06  [facility] of [facility] CM/SW [place], RN Phone Number: 12/30/2021, 9:50 AM  Clinical Narrative:    Substance abuse resources added to the AVS, [facility] application provided, Med mgt added to chart   Expected Discharge Plan: Home/Self Care Barriers to Discharge: Continued Medical Work up  Expected Discharge Plan and Services Expected Discharge Plan: Home/Self Care                                               Social Determinants of Health (SDOH) Interventions    Readmission Risk Interventions     No data to display

## 2021-12-31 ENCOUNTER — Encounter: Payer: Self-pay | Admitting: Internal Medicine

## 2021-12-31 ENCOUNTER — Inpatient Hospital Stay: Payer: Self-pay

## 2021-12-31 LAB — COMPREHENSIVE METABOLIC PANEL
ALT: 19 U/L (ref 0–44)
AST: 85 U/L — ABNORMAL HIGH (ref 15–41)
Albumin: 2.5 g/dL — ABNORMAL LOW (ref 3.5–5.0)
Alkaline Phosphatase: 101 U/L (ref 38–126)
Anion gap: 8 (ref 5–15)
BUN: <5 mg/dL — ABNORMAL LOW (ref 6–20)
CO2: 29 mmol/L (ref 22–32)
Calcium: 8.4 mg/dL — ABNORMAL LOW (ref 8.9–10.3)
Chloride: 98 mmol/L (ref 98–111)
Creatinine, Ser: 0.32 mg/dL — ABNORMAL LOW (ref 0.61–1.24)
GFR, Estimated: >60 mL/min (ref >60)
Glucose, Bld: 99 mg/dL (ref 70–99)
Potassium: 3.7 mmol/L (ref 3.5–5.1)
Sodium: 135 mmol/L (ref 135–145)
Total Bilirubin: 13.4 mg/dL — ABNORMAL HIGH (ref 0.3–1.2)
Total Protein: 7.5 g/dL (ref 6.5–8.1)

## 2021-12-31 LAB — CULTURE, BLOOD (ROUTINE X 2)
Culture: NO GROWTH
Culture: NO GROWTH
Special Requests: ADEQUATE
Special Requests: ADEQUATE

## 2021-12-31 LAB — PROTIME-INR
INR: 1.6 — ABNORMAL HIGH (ref 0.8–1.2)
Prothrombin Time: 19.2 seconds — ABNORMAL HIGH (ref 11.4–15.2)

## 2021-12-31 LAB — VITAMIN B1: Vitamin B1 (Thiamine): 130.8 nmol/L (ref 66.5–200.0)

## 2021-12-31 LAB — SARS CORONAVIRUS 2 BY RT PCR: SARS Coronavirus 2 by RT PCR: NEGATIVE

## 2021-12-31 MED ORDER — IOHEXOL 300 MG/ML  SOLN
100.0000 mL | Freq: Once | INTRAMUSCULAR | Status: AC | PRN
  Administered 2021-12-31: 100 mL via INTRAVENOUS

## 2021-12-31 NOTE — Progress Notes (Signed)
Progress Note   Patient: Cameron Santiago [id number] DOB: [birth year] DOA: 12/22/2021     9 DOS: the patient was seen and examined on 12/31/2021   Brief hospital course:   26 years old male with PMH significant for alcohol abuse, alcoholic liver cirrhosis, GERD who presents to the ER for evaluation of abdominal pain as well as testicular pain. Patient states symptoms started 3 days ago and is associated with nausea and vomiting.He complains of lower testicular pain but denies having any penile discharge.  He is currently not sexually active.  Work-up in the ED showed hypokalemia, hypomagnesemia GC and chlamydia cultures negative.  CT abdomen showed morphological features of liver cirrhosis, including hepatomegaly, irregular liver contour and severe hepatic steatosis.  Testicular ultrasound showed finding consistent with acute epididymitis.  Patient is admitted for further evaluation.  He is started on ceftriaxone and doxycycline.  Urology was consulted, recommended to continue antibiotics, adequate pain control, scrotal support, compressive underwear, cryotherapy as needed.   11/1: Patient continued to have significant pain.  Multiple electrolyte abnormalities which include hypomagnesemia, hypophosphatemia and hypokalemia which are being repleted. Gonorrhea and chlamydia PCR negative, HIV negative.  Ordered RPR. Patient also has paracentesis done yesterday with removal of only 1 mL of fluid, apparently there was no significant ascites.  Preliminary cultures negative. CIWA score of 4.  Counseling was provided for alcohol cessation.   11/2: Patient had another spike of fever up to 101.5 overnight.,Antibiotics switched to Levaquin. Blood pressure remained elevated-restarting home amlodipine. Potassium and corrected calcium remained low-being repleted. Worsening T. bili at 9.8 and INR 1.7-GI was consulted. Multiple labs to start the work-up of this new diagnosis of liver cirrhosis especially at  this age was ordered by GI. Ordered RUQ [place] for worsening abdominal distension.   11/3: Some improvement in abdominal distention after having 2-3 bowel movements. Right upper quadrant ultrasound with trace ascites only. GI switched him to MiraLAX due to concern of bloating with lactulose. GI also added low-dose Lasix, done and rifaximin. Labs for secondary causes of liver cirrhosis with negative ANA negative anti-smooth muscle antibody, mitochondrial antibody, phenotypes microsomal antibody.  Pending alpha 1 antitrypsin, ceruloplasmin, celiac disease panel and B1. Became febrile again despite change to antibiotic.  Sending blood and urine cultures to rule out any resistant bacteria.Marland Kitchen   11/4: Patient remained febrile, continued to have significant abdominal pain.  Switching antibiotics to Zosyn while waiting for urine and blood cultures.  Ordered repeat scrotal ultrasound  to rule out any abscesses.  Ordered an MRI of liver as advised on CT abdomen for further characterization of his liver lesion as he has deteriorating liver function. Worsening T. bili and INR.   11/5: Patient continued to have a low-grade fever.  Liver MRI with concern of Elita Boone with cirrhosis, focal fat deposits and no evidence of HCC.  Gallbladder wall thickening with no cholecystitis.  Very mild ascites.  Blood and urine cultures remain negative. MELD sodium 23, child class C, worsening T. bili with slight improvement in INR secondary liver disease work-up with mildly elevated ceruloplasmin 31.9.  Celiac disease panel with elevated IgA at 767.  Rest of the work-up negative. Alpha 1 antitrypsin and B1 levels pending. GI increase the dose of Lasix to 40 and spironolactone to 100.   11/6: Clinically feeling better with less abdominal pain.  Hepatic functions deteriorated but seems stable with INR of 1.7 and T. bili of 13.  Remained afebrile over the past 24 hours. We will continue with Zosyn for  another day.   11/7: Maximum  temperature of 99.9 over the past 24 hours, INR at 1.6 with slight worsening of T. bili at 13.7, little worsening of leukocytosis at 11.8 but all cell lines decreased might be hemoconcentration with diuretics-giving some IV fluid. GI wants to keep him in the hospital until T. bili started trending down. Will remain on Zosyn.   Assessment and Plan: * Acute epididymitis Fever curve started improving on Zosyn, Patient presents for evaluation of left testicular pain Testicle ultrasound is concerning for acute epididymitis Consult urology-recommending continuation of antibiotics, scrotal support, and cryotherapy as needed. HIV, chlamydia and gonorrhea PCR negative.  he received doxycycline and ceftriaxone followed by Levaquin, currently on Zosyn -10 days abx tomorrow -Repeat scrotal ultrasound to rule out any abscesses-apparently patient refused Urine and blood cultures negative .-Continue with supportive care - check covid swab  Abdominal pain Llq, some loose stool - check ct abdomen/pelvis  Alcoholic cirrhosis of liver with ascites ([place]) Worsening function with worsening T. bili and INR with small improvement with Patient with a history of alcohol abuse who presents for evaluation of diffuse abdominal pain associated with nausea and vomiting. CT scan of abdomen and pelvis shows morphologic features of the liver concerning for cirrhosis, including: marked hepatomegaly, irregular liver contour and severe hepatic steatosis with stigmata of portal venous hypertension noted including varices, splenomegaly and ascites which appears to be new. Paracentesis was ordered with removal of only 1 mL of fluid, apparently there was not much fluid present-preliminary cultures negative. -Patient was counseled for alcohol cessation. -GI was consulted  due to worsening T. bili and INR-ordered multiple labs to rule out other possible etiology for liver dysfunction, mostly negative so far and few  pending. -Right upper quadrant repeat ultrasound with trace ascites only. -Started on  Lasix, rifaximin and spironolactone -Lactulose switched with MiraLAX due to concern of bloating -Liver MRI-confirms cirrhosis, no concern of HCC, mild ascites -GI wants to see downward trend in T. bili and INR before discharge -Need to follow-up at liver center in a tertiary care setting.  Alcohol dependence with withdrawal ([place]) Patient admits to daily alcohol use with symptoms of alcohol withdrawal when he does not drink CIWA score of 4 -Continue with CIWA protocol -Continue with supportive care and supplements  GERD (gastroesophageal reflux disease) ppi  Obesity (BMI 30-39.9) Estimated body mass index is 37.63 kg/m as calculated from the following:   Height as of this encounter: 5\' 11"  (1.803 m).   Weight as of this encounter: 122.4 kg.   -Encouraged weight loss -Will complicate overall prognosis   Subjective: onging ruq and llq pain, scrotal pain resolved  Physical Exam: Vitals:   12/30/21 0712 12/30/21 1546 12/31/21 0013 12/31/21 0846  BP: 139/81 (!) 143/77 138/74 (!) 149/85  Pulse: 93 83 92 97  Resp: 16  18 16   Temp: 99 F (37.2 C) 98.6 F (37 C) 98.4 F (36.9 C) 99.6 F (37.6 C)  TempSrc:      SpO2: 93% 95% 93% 93%  Weight:      Height:      General.  Obese gentleman, in no acute distress. Pulmonary.  Lungs clear bilaterally, normal respiratory effort. CV.  Regular rate and rhythm, no JVD, rub or murmur. Abdomen.  Soft, nontender, nondistended, BS positive. CNS.  Alert and oriented .  No focal neurologic deficit. Extremities.  No edema, no cyanosis, pulses intact and symmetrical. Psychiatry.  Judgment and insight appears normal.   Data Reviewed: Prior data reviewed  Family Communication: Discussed with a friend and patient  Disposition: Status is: Inpatient Remains inpatient appropriate because: Severity of illness   Planned Discharge Destination: Home   This  record has been created using Systems analyst. Errors have been sought and corrected,but may not always be located. Such creation errors do not reflect on the standard of care.  Author: Desma Maxim, MD 12/31/2021 3:37 PM  For on call review [email or web address].

## 2022-01-01 LAB — COMPREHENSIVE METABOLIC PANEL
ALT: 20 U/L (ref 0–44)
AST: 89 U/L — ABNORMAL HIGH (ref 15–41)
Albumin: 2.6 g/dL — ABNORMAL LOW (ref 3.5–5.0)
Alkaline Phosphatase: 108 U/L (ref 38–126)
Anion gap: 8 (ref 5–15)
BUN: <5 mg/dL — ABNORMAL LOW (ref 6–20)
CO2: 29 mmol/L (ref 22–32)
Calcium: 8.6 mg/dL — ABNORMAL LOW (ref 8.9–10.3)
Chloride: 100 mmol/L (ref 98–111)
Creatinine, Ser: 0.52 mg/dL — ABNORMAL LOW (ref 0.61–1.24)
GFR, Estimated: >60 mL/min (ref >60)
Glucose, Bld: 94 mg/dL (ref 70–99)
Potassium: 3.6 mmol/L (ref 3.5–5.1)
Sodium: 137 mmol/L (ref 135–145)
Total Bilirubin: 13.7 mg/dL — ABNORMAL HIGH (ref 0.3–1.2)
Total Protein: 7.6 g/dL (ref 6.5–8.1)

## 2022-01-01 MED ORDER — ADULT MULTIVITAMIN W/MINERALS CH: 1.0000 | ORAL_TABLET | Freq: Every day | ORAL | 1 refills | Status: AC

## 2022-01-01 MED ORDER — SPIRONOLACTONE 100 MG PO TABS: 100.0000 mg | ORAL_TABLET | Freq: Every day | ORAL | 1 refills | Status: DC

## 2022-01-01 MED ORDER — SODIUM CHLORIDE 0.9% FLUSH: 10.0000 mL | INTRAVENOUS | Status: DC | PRN

## 2022-01-01 MED ORDER — FUROSEMIDE 40 MG PO TABS: 40.0000 mg | ORAL_TABLET | Freq: Every day | ORAL | 1 refills | Status: DC

## 2022-01-01 MED ORDER — FOLIC ACID 1 MG PO TABS: 1.0000 mg | ORAL_TABLET | Freq: Every day | ORAL | 1 refills | Status: DC

## 2022-01-01 NOTE — Progress Notes (Signed)
Discharge instructions given to the patient.  Patient verbalized understanding.  Patient is waiting for his girlfriend for change of clothes.  Questions addressed.  No complaints at this time.

## 2022-01-01 NOTE — Progress Notes (Signed)
Patient's girlfriend in to pick up pt.  Pt refused wheelchair.  Discharged home.

## 2022-01-01 NOTE — Progress Notes (Signed)

## 2022-01-01 NOTE — Discharge Summary (Signed)
Cameron Santiago PXT:[id number] DOB: 01-Aug-1995 DOA: 12/22/2021  PCP: Pcp, No  Admit date: 12/22/2021 Discharge date: 01/01/2022  Time spent: 35 minutes  Recommendations for Outpatient Follow-up:  Establish with a pcp and f/u with GI Ongoing alcohol cessation     Discharge Diagnoses:  Principal Problem:   Acute epididymitis Active Problems:   Alcoholic cirrhosis of liver with ascites (HCC)   Alcohol dependence with withdrawal (HCC)   Hypokalemia   Hypomagnesemia   Hypophosphatemia   GERD (gastroesophageal reflux disease)   Obesity (BMI 30-39.9)   Elevated bilirubin   Elevated LFTs   Discharge Condition: stable  Diet recommendation: low sodium  Filed Weights   12/21/21 2349 12/22/21 2116  Weight: 113.4 kg 122.4 kg    History of present illness:  From admission h and p Cameron Santiago is a 26 y.o. male with medical history significant for alcohol abuse, alcoholic liver cirrhosis, GERD who presents to the ER for evaluation of abdominal pain as well as testicular pain.  Patient states symptoms started 3 days ago and is associated with nausea and vomiting.  He rates his abdominal pain a 10 x 10 in intensity at its worst.  Pain is diffuse.  Last bowel movement was 1 day prior to his admission.  He denies having any fever or chills and denies having any hematemesis, no hematochezia or passage of melena stools.  He denies having any alleviating factors but states that any form of movement makes his abdominal pain worse. He complains of lower testicular pain but denies having any penile discharge.  He is currently not sexually active.  Denies having any trauma. He denies having any chest pain, no shortness of breath, no headache, no urinary symptoms, no dizziness, no lightheadedness, no blurred vision or focal deficit. Labs show significant hypokalemia with potassium of 2.6, hypomagnesemia with magnesium level of 1.4, transaminitis with AST/ALT ratio of 2 is to 1 and  hyperbilirubinemia Chlamydia and Neisseria gonorrhea species, not detected CT scan of abdomen and pelvis shows morphologic features of the liver concerning for cirrhosis, including: marked hepatomegaly, irregular liver contour and severe hepatic steatosis. Stigmata of portal venous hypertension noted including varices, splenomegaly and ascites. Ascites is new when compared with the previous exam. There are several central low-attenuation areas within the liver which are more conspicuous when compared with 10/08/2021. Favor areas of focal fatty deposition. Consider more definitive characterization with nonemergent liver protocol MRI. Testicular ultrasound shows heterogeneous, hypervascular left epididymis, compatible with changes of acute epididymitis. Normal sonographic appearance of the bilateral testicles Received a dose of Rocephin 2 g IV and doxycycline 100 mg IV x1 Electrolytes have been supplemented as well Patient will be admitted to the hospital for further evaluation  Hospital Course:  Patient presented with acute epididymitis. Labs negative for STI, urine culture negative, treated with a 10 day course of antibiotics. Symptoms resolved. 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 Had trace ascites. GI followed. Started on diuretics which he tolerated. Will need outpt GI f/u and alcohol abstinence. Hospital course complicated by persistent fevers. Abx were initially doxycycline and ceftriaxone followed by levaquin eventually broadened to zosyn. No fevers last several days. Covid negative, blood cultures negative, ct abdomen/pelvis negative for acute pathology.   Procedures: none   Consultations: GI  Discharge Exam: Vitals:   12/31/21 2313 01/01/22 0740  BP: (!) 147/74 132/75  Pulse: 92 88  Resp: 20 16  Temp: 98 F (36.7 C) 99.2 F (37.3 C)  SpO2: 92% 94%  General: NAD Cardiovascular: RRR Respiratory: CTAB Abdomen: mild distention, mild  generalized tenderness no rebound  Discharge Instructions   Discharge Instructions     Diet - low sodium heart healthy   Complete by: As directed    Increase activity slowly   Complete by: As directed    No wound care   Complete by: As directed       Allergies as of 01/01/2022   No Known Allergies      Medication List     STOP taking these medications    dicyclomine 10 MG capsule Commonly known as: BENTYL   famotidine 20 MG tablet Commonly known as: PEPCID   ondansetron 4 MG disintegrating tablet Commonly known as: ZOFRAN-ODT   sucralfate 1 g tablet Commonly known as: Carafate       TAKE these medications    albuterol 108 (90 Base) MCG/ACT inhaler Commonly known as: VENTOLIN HFA Inhale 2 puffs into the lungs every 6 (six) hours as needed for wheezing or shortness of breath.   amLODipine 5 MG tablet Commonly known as: NORVASC Take 1 tablet (5 mg total) by mouth daily.   folic acid 1 MG tablet Commonly known as: FOLVITE Take 1 tablet (1 mg total) by mouth daily.   furosemide 40 MG tablet Commonly known as: LASIX Take 1 tablet (40 mg total) by mouth daily.   multivitamin with minerals Tabs tablet Take 1 tablet by mouth daily.   spironolactone 100 MG tablet Commonly known as: ALDACTONE Take 1 tablet (100 mg total) by mouth daily.       No Known Allergies  Follow-up Information     [facility] Follow up.   Why: this is a local primary care provider Contact information: [address] [place] [address] [phone]         Toney Reil, MD Follow up.   Specialty: Gastroenterology Why: This is the liver doctor you should call to schedule follow-up Contact information: [address] [place] [address] [phone]                  The results of significant diagnostics from this hospitalization (including imaging, microbiology, ancillary and laboratory) are listed below for reference.     Significant Diagnostic Studies: CT ABDOMEN PELVIS W CONTRAST  Result Date: 12/31/2021 CLINICAL DATA:  Left lower quadrant pain, ongoing right upper quadrant pain. Patient with history of cirrhosis. EXAM: CT ABDOMEN AND PELVIS WITH CONTRAST TECHNIQUE: Multidetector CT imaging of the abdomen and pelvis was performed using the standard protocol following bolus administration of intravenous contrast. RADIATION DOSE REDUCTION: This exam was performed according to the departmental dose-optimization program which includes automated exposure control, adjustment of the mA and/or kV according to patient size and/or use of iterative reconstruction technique. CONTRAST:  OMNIPAQUE IOHEXOL 300 MG/ML  SOLN COMPARISON:  CT 12/22/2021, MRI 12/27/2021 FINDINGS: Lower chest: Mild cardiomegaly. There is scattered linear atelectasis in the lung bases. Trace pleural thickening without significant effusion. Hepatobiliary: Prominent hepatomegaly with liver spanning 31.3 cm cranial caudal with steatosis. Geographic low-density lesions are stable from prior CT in characterized as more focal fatty deposition on MRI. Slight capsular nodularity is again seen. Gallbladder is contracted, wall thickening is not unexpected for contracted state and underlying liver disease. No calcified gallstone. There is no biliary dilatation. Pancreas: No ductal dilatation or inflammation. Spleen: Splenomegaly with spleen spanning 16.7 cm cranial caudal. No focal abnormality. Adrenals/Urinary Tract: Normal adrenal glands. No hydronephrosis or perinephric edema. Homogeneous renal  enhancement. No renal calculi or focal lesion. Urinary bladder is partially distended without wall thickening. Stomach/Bowel: Stomach is nondistended. There is no bowel obstruction. Prior appendectomy. Low-density involving the ascending colon likely represents portal colopathy. No inflammatory change. Moderate volume of colonic stool. No evidence of colonic inflammation.  Vascular/Lymphatic: Normal caliber abdominal aorta. Patent portal, splenic and superior mesenteric veins. There portosystemic collaterals primarily in the left abdomen. Recannulated umbilical vein. No suspicious adenopathy. Multifocal prominent lymph nodes in multiple stations are typically reactive. Reproductive: Prostate is unremarkable. Other: Small to moderate volume of abdominopelvic ascites which is increased from prior CT. No free air. There is generalized edema of the subcutaneous and mesenteric fat. Musculoskeletal: There are no acute or suspicious osseous abnormalities. Neck regularity of T12 superior endplate. IMPRESSION: 1. Cirrhosis with marked hepatomegaly and steatosis.  Splenomegaly. 2. Small to moderate volume of abdominopelvic ascites, increased from prior CT. Generalized edema of the subcutaneous and mesenteric fat. 3. Low-density involving the ascending colon likely represents portal colopathy. 4. Explanation for left lower quadrant pain. Electronically Signed   By: Narda RutherfordMelanie  Sanford M.D.   On: 12/31/2021 19:54   MR LIVER W WO CONTRAST  Result Date: 12/27/2021 CLINICAL DATA:  Cirrhosis, liver lesion on CT EXAM: MRI ABDOMEN WITHOUT AND WITH CONTRAST TECHNIQUE: Multiplanar multisequence MR imaging of the abdomen was performed both before and after the administration of intravenous contrast. CONTRAST:  10mL GADAVIST GADOBUTROL 1 MMOL/ML IV SOLN COMPARISON:  CT abdomen/pelvis dated 12/22/2021 FINDINGS: Lower chest: Mild lower lobe atelectasis. Hepatobiliary: Macronodular hepatic contour, supporting the diagnosis of cirrhosis. Severe hepatic steatosis. Lesions on CT correspond to superimposed focal fat deposition (series 5/images 61, 70, 74, and 84) which are even more pronounced than the baseline level of steatosis. No findings suspicious for HCC. No suspicious/enhancing hepatic lesions. No restricted diffusion. Gallbladder wall edema, without gallbladder distension or cholelithiasis. No  intrahepatic or extrahepatic duct dilatation. Pancreas:  Within normal limits. Spleen: Enlarged, measuring 17.0 cm in maximal craniocaudal dimension. Adrenals/Urinary Tract:  Adrenal glands are within normal limits. Kidneys are within normal limits.  No hydronephrosis. Stomach/Bowel: Stomach and visualized bowel are grossly unremarkable. Vascular/Lymphatic: No evidence of abdominal aortic aneurysm. Portal vein is patent. No suspicious abdominal lymphadenopathy. Other:  Very mild upper abdominal/perihepatic ascites. Musculoskeletal: No focal osseous lesions. IMPRESSION: Findings suggestive of NASH with cirrhosis. Multiple areas of superimposed focal fat deposition, corresponding to the lesions on CT, benign. No findings suspicious for HCC. Splenomegaly. Very mild upper abdominal/perihepatic ascites. Portal vein is patent. Secondary gallbladder wall thickening/edema, without findings suspicious for cholecystitis. Electronically Signed   By: Charline BillsSriyesh  Krishnan M.D.   On: 12/27/2021 17:49   US ASCITES (ABDOMEN LIMITED)  Result Date: 12/25/2021 CLINICAL DATA:  Ascites EXAM: LIMITED ABDOMEN ULTRASOUND FOR ASCITES TECHNIQUE: Limited ultrasound survey for ascites was performed in all four abdominal quadrants. COMPARISON:  12/23/2021 FINDINGS: There is trace amount of ascites in left upper quadrant of abdomen. There is significant interval decrease in amount of ascites. IMPRESSION: Trace amount of residual ascites is seen in left upper quadrant. Electronically Signed   By: Ernie AvenaPalani  Rathinasamy M.D.   On: 12/25/2021 17:50   US Paracentesis  Result Date: 12/23/2021 INDICATION: 26 year old male. History alcohol abuse with alcoholic liver cirrhosis. Request for diagnostic paracentesis EXAM: ULTRASOUND GUIDED diagnostic PARACENTESIS MEDICATIONS: Lidocaine 1% 10 mL COMPLICATIONS: None immediate. PROCEDURE: Informed written consent was obtained from the patient after a discussion of the risks, benefits and alternatives to  treatment. A timeout was performed prior to the initiation of the  procedure. Initial ultrasound scanning demonstrates a minimal amount of ascites within the right lower abdominal quadrant. The right lower abdomen was prepped and draped in the usual sterile fashion. 1% lidocaine was used for local anesthesia. Following this, a 19 gauge, 10-cm, Yueh catheter was introduced. An ultrasound image was saved for documentation purposes. The paracentesis was performed. The catheter was removed and a dressing was applied. The patient tolerated the procedure well without immediate post procedural complication. FINDINGS: A total of approximately 1 mL of straw-colored fluid was removed. Samples were sent to the laboratory as requested by the clinical team. IMPRESSION: Successful ultrasound-guided therapeutic paracentesis yielding 1 mL liters of peritoneal fluid. PLAN: If the patient eventually requires >/=2 paracenteses in a 30 day period, candidacy for formal evaluation by the [facility] will be assessed. Electronically Signed   By: Gilmer Mor D.O.   On: 12/23/2021 15:09   CT ABDOMEN PELVIS W CONTRAST  Result Date: 12/22/2021 CLINICAL DATA:  Nausea and vomiting.  Abdominal pain. EXAM: CT ABDOMEN AND PELVIS WITH CONTRAST TECHNIQUE: Multidetector CT imaging of the abdomen and pelvis was performed using the standard protocol following bolus administration of intravenous contrast. RADIATION DOSE REDUCTION: This exam was performed according to the departmental dose-optimization program which includes automated exposure control, adjustment of the mA and/or kV according to patient size and/or use of iterative reconstruction technique. CONTRAST:  OMNIPAQUE IOHEXOL 300 MG/ML  SOLN COMPARISON:  10/08/2021 FINDINGS: Lower chest: No acute abnormality. Hepatobiliary: Marked diffuse hepatic steatosis. The liver is enlarged measuring 30 point 1 cm along the midclavicular line.  More focal areas of low-attenuation are identified within segment 4 and segment 5 centrally which are more conspicuous when compared with the previous exam, image 41/2 and image 48/2. Gallbladder is unremarkable. No signs of bile duct dilatation. Pancreas: Unremarkable. No pancreatic ductal dilatation or surrounding inflammatory changes. Spleen: Spleen is enlarged measuring 15.7 cm. No focal splenic abnormality. Adrenals/Urinary Tract: Normal adrenal glands. Kidneys are unremarkable. Urinary bladder appears normal. Stomach/Bowel: Stomach appears within normal limits. The appendix is surgically absent. No bowel wall thickening, inflammation, or distension. Vascular/Lymphatic: Recanalization of the umbilical vein. Multiple small varicosities identified within the left hemiabdomen. Portal vein, portal venous confluence, splenic vein and SMV remain patent. No signs of abdominopelvic adenopathy. Reproductive: Prostate is unremarkable. Other: There is abdominopelvic ascites scratch set small volume of perihepatic ascites is noted. Ascites extends along both pericolic gutters into the pelvis. This is new when compared with the previous exam. Musculoskeletal: No acute or significant osseous findings. IMPRESSION: 1. Morphologic features of the liver concerning for cirrhosis, including: Marked hepatomegaly, irregular liver contour and severe hepatic steatosis. 2. Stigmata of portal venous hypertension noted including varices, splenomegaly and ascites. Ascites is new when compared with the previous exam. 3. There are several central low-attenuation areas within the liver which are more conspicuous when compared with 10/08/2021. Favor areas of focal fatty deposition. Consider more definitive characterization with nonemergent liver protocol MRI. Electronically Signed   By: Signa Kell M.D.   On: 12/22/2021 08:05   US SCROTUM W/DOPPLER  Result Date: 12/22/2021 CLINICAL DATA:  Left testicular pain EXAM: SCROTAL ULTRASOUND  DOPPLER ULTRASOUND OF THE TESTICLES TECHNIQUE: Complete ultrasound examination of the testicles, epididymis, and other scrotal structures was performed. Color and spectral Doppler ultrasound were also utilized to evaluate blood flow to the testicles. COMPARISON:  None Available. FINDINGS: Right testicle Measurements: 4.9 x 2.2 x 3.3 cm. Normal parenchymal echogenicity and echotexture. Normal color flow  vascularity. No mass or microlithiasis visualized. Left testicle Measurements: 4.3 x 2.5 x 2.8 cm. Normal parenchymal echogenicity and echotexture. Normal color flow vascularity. No mass or microlithiasis visualized. Right epididymis:  Normal in size and appearance. Left epididymis: The left epididymis is normal in size but demonstrates heterogeneity and hypervascularity which may relate to changes of acute epididymitis. Hydrocele:  None visualized. Varicocele:  None visualized. Pulsed Doppler interrogation of both testes demonstrates normal low resistance arterial and venous waveforms bilaterally. IMPRESSION: 1. Heterogeneous, hypervascular left epididymis, compatible with changes of acute epididymitis. 2. Normal sonographic appearance of the bilateral testicles. Electronically Signed   By: Helyn Numbers M.D.   On: 12/22/2021 01:28    Microbiology: Recent Results (from the past 240 hour(s))  Body fluid culture w Gram Stain     Status: None   Collection Time: 12/23/21  2:25 PM   Specimen: PATH Cytology Peritoneal fluid  Result Value Ref Range Status   Specimen Description   Final    PERITONEAL Performed at [facility], [address], [place], [place] [address]    Special Requests   Final    NONE Performed at [facility], [address] [place], [place] [address]    Gram Stain   Final    RARE WBC PRESENT, PREDOMINANTLY MONONUCLEAR NO ORGANISMS SEEN    Culture   Final    NO GROWTH 3 DAYS Performed at [facility], [address] [place], [place] [address]    Report  Status 12/27/2021 FINAL  Final  Urine Culture     Status: None   Collection Time: 12/26/21 10:28 AM   Specimen: Urine, Clean Catch  Result Value Ref Range Status   Specimen Description   Final    URINE, CLEAN CATCH Performed at [facility], [address] [place], [place] [address]    Special Requests   Final    NONE Performed at [facility], [address], [place], [place] [address]    Culture   Final    NO GROWTH Performed at [facility], [address] [place], [place] [address]    Report Status 12/27/2021 FINAL  Final  Culture, blood (Routine X 2) w Reflex to ID Panel     Status: None   Collection Time: 12/26/21 10:57 AM   Specimen: BLOOD  Result Value Ref Range Status   Specimen Description BLOOD RIGHT ANTECUBITAL  Final   Special Requests   Final    BOTTLES DRAWN AEROBIC AND ANAEROBIC Blood Culture adequate volume   Culture   Final    NO GROWTH 5 DAYS Performed at [facility], [address], [place], [place] [address]    Report Status 12/31/2021 FINAL  Final  Culture, blood (Routine X 2) w Reflex to ID Panel     Status: None   Collection Time: 12/26/21 10:57 AM   Specimen: BLOOD  Result Value Ref Range Status   Specimen Description BLOOD BLOOD RIGHT HAND  Final   Special Requests   Final    BOTTLES DRAWN AEROBIC AND ANAEROBIC Blood Culture adequate volume   Culture   Final    NO GROWTH 5 DAYS Performed at [facility], [address], [place], [place] [address]    Report Status 12/31/2021 FINAL  Final  SARS Coronavirus 2 by RT PCR (hospital order, performed in [facility] hospital lab) *cepheid single result test* Anterior Nasal Swab     Status: None   Collection Time:  12/31/21  3:38 PM   Specimen: Anterior Nasal Swab  Result Value Ref Range Status   SARS Coronavirus 2 by RT PCR NEGATIVE NEGATIVE Final    Comment: (NOTE) SARS-CoV-2 target nucleic acids are NOT DETECTED.  The SARS-CoV-2 RNA is generally  detectable in upper and lower respiratory specimens during the acute phase of infection. The lowest concentration of SARS-CoV-2 viral copies this assay can detect is 250 copies / mL. A negative result does not preclude SARS-CoV-2 infection and should not be used as the sole basis for treatment or other patient management decisions.  A negative result may occur with improper specimen collection / handling, submission of specimen other than nasopharyngeal swab, presence of viral mutation(s) within the areas targeted by this assay, and inadequate number of viral copies (<250 copies / mL). A negative result must be combined with clinical observations, patient history, and epidemiological information.  Fact Sheet for Patients:   RoadLapTop.co.za  Fact Sheet for Healthcare Providers: http://kim-miller.com/  This test is not yet approved or  cleared by the [place] FDA and has been authorized for detection and/or diagnosis of SARS-CoV-2 by FDA under an Emergency Use Authorization (EUA).  This EUA will remain in effect (meaning this test can be used) for the duration of the COVID-19 declaration under Section 564(b)(1) of the Act, 21 U.S.C. section 360bbb-3(b)(1), unless the authorization is terminated or revoked sooner.  Performed at [facility], [address] [place], [place] [address]      Labs: Basic Metabolic Panel: Recent Labs  Lab 12/27/21 0459 12/27/21 0509 12/28/21 0423 12/29/21 0432 12/30/21 0429 12/31/21 0447 01/01/22 0509  NA  --  136 134* 135 136 135 137  K  --  3.7 3.8 3.6 3.7 3.7 3.6  CL  --  102 98 97* 98 98 100  CO2  --  26 26 29 28 29 29   GLUCOSE  --  93 111* 94 95 99 94  BUN  --  <5* <5* <5* <5* <5* <5*  CREATININE  --  0.42* 0.41* 0.41* 0.33* 0.32* 0.52*  CALCIUM  --  7.8* 8.2* 8.1* 8.1* 8.4* 8.6*  MG  --  1.6* 1.9 1.7 1.7  --   --   PHOS 2.5  --  3.5  --   --   --   --    Liver Function  Tests: Recent Labs  Lab 12/28/21 0413 12/29/21 0431 12/30/21 0429 12/31/21 0447 01/01/22 0509  AST 82* 83* 85* 85* 89*  ALT 18 17 19 19 20   ALKPHOS 111 97 100 101 108  BILITOT 13.4* 13.0* 13.7* 13.4* 13.7*  PROT 7.6 7.0 7.4 7.5 7.6  ALBUMIN 2.6* 2.4* 2.5* 2.5* 2.6*   No results for input(s): "LIPASE", "AMYLASE" in the last 168 hours. Recent Labs  Lab 12/25/21 1435  AMMONIA 70*   CBC: Recent Labs  Lab 12/26/21 0529 12/30/21 0429  WBC 9.2 11.8*  HGB 11.9* 13.0  HCT 33.9* 37.2*  MCV 99.4 100.3*  PLT 147* 280   Cardiac Enzymes: No results for input(s): "CKTOTAL", "CKMB", "CKMBINDEX", "TROPONINI" in the last 168 hours. BNP: BNP (last 3 results) No results for input(s): "BNP" in the last 8760 hours.  ProBNP (last 3 results) No results for input(s): "PROBNP" in the last 8760 hours.  CBG: No results for input(s): "GLUCAP" in the last 168 hours.     Signed:  13/03/23 MD.  [facility] 01/01/2022, 9:59 AM

## 2022-01-01 NOTE — Plan of Care (Signed)
Patient continue to improved with pain management. Increase edema noted on left lower extremity comparing to right lower extremity. No other concern voice or distress noted during shift.    Problem: Education: Goal: Knowledge of General Education information will improve Description: Including pain rating scale, medication(s)/side effects and non-pharmacologic comfort measures Outcome: Progressing   Problem: Coping: Goal: Level of anxiety will decrease Outcome: Progressing   Problem: Pain Managment: Goal: General experience of comfort will improve Outcome: Progressing   Problem: Safety: Goal: Ability to remain free from injury will improve Outcome: Progressing

## 2022-01-14 ENCOUNTER — Ambulatory Visit: Payer: Self-pay | Admitting: Gastroenterology

## 2022-03-12 ENCOUNTER — Emergency Department: Payer: Medicaid Other

## 2022-03-12 ENCOUNTER — Emergency Department
Admission: EM | Admit: 2022-03-12 | Discharge: 2022-03-12 | Disposition: A | Discharge status: Home / Self Care | Payer: Medicaid Other | Attending: Emergency Medicine | Admitting: Emergency Medicine

## 2022-03-12 ENCOUNTER — Other Ambulatory Visit: Payer: Self-pay

## 2022-03-12 ENCOUNTER — Encounter: Payer: Self-pay | Admitting: Emergency Medicine

## 2022-03-12 DIAGNOSIS — E876 Hypokalemia: Secondary | ICD-10-CM | POA: Diagnosis not present

## 2022-03-12 DIAGNOSIS — R112 Nausea with vomiting, unspecified: Secondary | ICD-10-CM | POA: Insufficient documentation

## 2022-03-12 DIAGNOSIS — R197 Diarrhea, unspecified: Secondary | ICD-10-CM | POA: Diagnosis not present

## 2022-03-12 DIAGNOSIS — K7460 Unspecified cirrhosis of liver: Secondary | ICD-10-CM | POA: Diagnosis not present

## 2022-03-12 DIAGNOSIS — Z1152 Encounter for screening for COVID-19: Secondary | ICD-10-CM | POA: Insufficient documentation

## 2022-03-12 LAB — CBC WITH DIFFERENTIAL/PLATELET
Abs Immature Granulocytes: 0.06 10*3/uL (ref 0.00–0.07)
Basophils Absolute: 0.1 10*3/uL (ref 0.0–0.1)
Basophils Relative: 1 %
Eosinophils Absolute: 0 10*3/uL (ref 0.0–0.5)
Eosinophils Relative: 0 %
HCT: 37.3 % — ABNORMAL LOW (ref 39.0–52.0)
Hemoglobin: 12.6 g/dL — ABNORMAL LOW (ref 13.0–17.0)
Immature Granulocytes: 1 %
Lymphocytes Relative: 10 %
Lymphs Abs: 1.2 10*3/uL (ref 0.7–4.0)
MCH: 32 pg (ref 26.0–34.0)
MCHC: 33.8 g/dL (ref 30.0–36.0)
MCV: 94.7 fL (ref 80.0–100.0)
Monocytes Absolute: 1.1 10*3/uL — ABNORMAL HIGH (ref 0.1–1.0)
Monocytes Relative: 9 %
Neutro Abs: 9 10*3/uL — ABNORMAL HIGH (ref 1.7–7.7)
Neutrophils Relative %: 79 %
Platelets: 155 10*3/uL (ref 150–400)
RBC: 3.94 MIL/uL — ABNORMAL LOW (ref 4.22–5.81)
RDW: 14.7 % (ref 11.5–15.5)
WBC: 11.4 10*3/uL — ABNORMAL HIGH (ref 4.0–10.5)
nRBC: 0 % (ref 0.0–0.2)

## 2022-03-12 LAB — COMPREHENSIVE METABOLIC PANEL
ALT: 28 U/L (ref 0–44)
AST: 144 U/L — ABNORMAL HIGH (ref 15–41)
Albumin: 3.5 g/dL (ref 3.5–5.0)
Alkaline Phosphatase: 127 U/L — ABNORMAL HIGH (ref 38–126)
Anion gap: 14 (ref 5–15)
BUN: <5 mg/dL — ABNORMAL LOW (ref 6–20)
CO2: 28 mmol/L (ref 22–32)
Calcium: 8.7 mg/dL — ABNORMAL LOW (ref 8.9–10.3)
Chloride: 91 mmol/L — ABNORMAL LOW (ref 98–111)
Creatinine, Ser: 0.58 mg/dL — ABNORMAL LOW (ref 0.61–1.24)
GFR, Estimated: >60 mL/min (ref >60)
Glucose, Bld: 119 mg/dL — ABNORMAL HIGH (ref 70–99)
Potassium: 3 mmol/L — ABNORMAL LOW (ref 3.5–5.1)
Sodium: 133 mmol/L — ABNORMAL LOW (ref 135–145)
Total Bilirubin: 4.2 mg/dL — ABNORMAL HIGH (ref 0.3–1.2)
Total Protein: 9.1 g/dL — ABNORMAL HIGH (ref 6.5–8.1)

## 2022-03-12 LAB — URINALYSIS, ROUTINE W REFLEX MICROSCOPIC
Bilirubin Urine: NEGATIVE
Glucose, UA: NEGATIVE mg/dL
Hgb urine dipstick: NEGATIVE
Ketones, ur: NEGATIVE mg/dL
Leukocytes,Ua: NEGATIVE
Nitrite: NEGATIVE
Protein, ur: NEGATIVE mg/dL
Specific Gravity, Urine: 1.004 — ABNORMAL LOW (ref 1.005–1.030)
pH: 8 (ref 5.0–8.0)

## 2022-03-12 LAB — RESP PANEL BY RT-PCR (RSV, FLU A&B, COVID)  RVPGX2
Influenza A by PCR: NEGATIVE
Influenza B by PCR: NEGATIVE
Resp Syncytial Virus by PCR: NEGATIVE
SARS Coronavirus 2 by RT PCR: NEGATIVE

## 2022-03-12 LAB — MAGNESIUM: Magnesium: 1 mg/dL — ABNORMAL LOW (ref 1.7–2.4)

## 2022-03-12 LAB — LIPASE, BLOOD: Lipase: 29 U/L (ref 11–51)

## 2022-03-12 MED ORDER — MAGNESIUM SULFATE 2 GM/50ML IV SOLN
2.0000 g | Freq: Once | INTRAVENOUS | Status: AC
  Administered 2022-03-12: 2 g via INTRAVENOUS
  Filled 2022-03-12: qty 50

## 2022-03-12 MED ORDER — POTASSIUM CHLORIDE 10 MEQ/100ML IV SOLN
10.0000 meq | Freq: Once | INTRAVENOUS | Status: AC
  Administered 2022-03-12: 10 meq via INTRAVENOUS
  Filled 2022-03-12: qty 100

## 2022-03-12 MED ORDER — MORPHINE SULFATE (PF) 2 MG/ML IV SOLN
2.0000 mg | Freq: Once | INTRAVENOUS | Status: AC
  Administered 2022-03-12: 2 mg via INTRAVENOUS
  Filled 2022-03-12: qty 1

## 2022-03-12 MED ORDER — IOHEXOL 350 MG/ML SOLN
100.0000 mL | Freq: Once | INTRAVENOUS | Status: AC | PRN
  Administered 2022-03-12: 100 mL via INTRAVENOUS

## 2022-03-12 MED ORDER — SODIUM CHLORIDE 0.9 % IV BOLUS
1000.0000 mL | Freq: Once | INTRAVENOUS | Status: AC
  Administered 2022-03-12: 1000 mL via INTRAVENOUS

## 2022-03-12 NOTE — ED Provider Notes (Signed)
[facility] Provider Note    Event Date/Time   First MD Initiated Contact with Patient 03/12/22 1044     (approximate)   History   URI   HPI  Cameron Santiago is a 27 y.o. male with a past medical history of alcohol dependence, obesity, cirrhosis who presents today for evaluation of abdominal pain, nausea, vomiting, and fever for the past 2 days.  He reports that he used to drink a pint of hard liquor but is cut down to a sixpack of beer daily for the last month.  He reports that he had a temperature of 102 F 2 days ago.  He reports that he has not taken any antipyretics today.  He denies any blood in his vomit or in his stool.  He reports that he last drank alcohol 2 days ago.  He has not been feeling particularly shaky.  He reports that his abdominal pain is diffuse in his abdomen.  Patient Active Problem List   Diagnosis Date Noted   Elevated bilirubin 12/26/2021   Elevated LFTs 12/26/2021   Obesity (BMI 30-39.9) 12/24/2021   Acute epididymitis 12/22/2021   Alcoholic cirrhosis of liver with ascites (HCC) 12/22/2021   Hypophosphatemia 07/13/2021   Hypomagnesemia 07/12/2021   Constipation 07/12/2021   Leukocytosis 07/11/2021   Hypokalemia 07/11/2021   Acute recurrent pancreatitis 07/10/2021   Transaminitis 07/10/2021   Alcohol dependence with withdrawal (HCC) 07/10/2021   GERD (gastroesophageal reflux disease) 07/10/2021   Elevated blood pressure reading 07/10/2021   Alcoholic pancreatitis 11/19/2020   Acute appendicitis 02/14/2015          Physical Exam   Triage Vital Signs: ED Triage Vitals  Enc Vitals Group     BP 03/12/22 1038 (!) 157/96     Pulse Rate 03/12/22 1038 (!) 102     Resp 03/12/22 1038 19     Temp 03/12/22 1038 98.8 F (37.1 C)     Temp Source 03/12/22 1038 Oral     SpO2 03/12/22 1038 99 %     Weight 03/12/22 1041 271 lb 2.7 oz (123 kg)     Height 03/12/22 1041 5\' 11"  (1.803 m)     Head Circumference --      Peak  Flow --      Pain Score 03/12/22 1041 8     Pain Loc --      Pain Edu? --      Excl. in [place]? --     Most recent vital signs: Vitals:   03/12/22 1038 03/12/22 1434  BP: (!) 157/96 (!) 148/90  Pulse: (!) 102 93  Resp: 19 18  Temp: 98.8 F (37.1 C) 98 F (36.7 C)  SpO2: 99% 99%    Physical Exam Vitals and nursing note reviewed.  Constitutional:      General: Awake and alert. No acute distress.    Appearance: Normal appearance. The patient is obese.  HENT:     Head: Normocephalic and atraumatic.     Mouth: Mucous membranes are moist.  Eyes:     General: PERRL. Normal EOMs        Right eye: No discharge.        Left eye: No discharge.     Conjunctiva/sclera: Conjunctivae normal.  Cardiovascular:     Rate and Rhythm: Normal rate and regular rhythm.     Pulses: Normal pulses.  Pulmonary:     Effort: Pulmonary effort is normal. No respiratory distress.     Breath sounds:  Normal breath sounds.  Abdominal:     Abdomen is soft. There is diffuse abdominal tenderness. No rebound or guarding. No distention. Musculoskeletal:        General: No swelling. Normal range of motion.     Cervical back: Normal range of motion and neck supple.  Skin:    General: Skin is warm and dry.     Capillary Refill: Capillary refill takes less than 2 seconds.     Findings: No rash.  Neurological:     Mental Status: The patient is awake and alert.      ED Results / Procedures / Treatments   Labs (all labs ordered are listed, but only abnormal results are displayed) Labs Reviewed  COMPREHENSIVE METABOLIC PANEL - Abnormal; Notable for the following components:      Result Value   Sodium 133 (*)    Potassium 3.0 (*)    Chloride 91 (*)    Glucose, Bld 119 (*)    BUN <5 (*)    Creatinine, Ser 0.58 (*)    Calcium 8.7 (*)    Total Protein 9.1 (*)    AST 144 (*)    Alkaline Phosphatase 127 (*)    Total Bilirubin 4.2 (*)    All other components within normal limits  CBC WITH  DIFFERENTIAL/PLATELET - Abnormal; Notable for the following components:   WBC 11.4 (*)    RBC 3.94 (*)    Hemoglobin 12.6 (*)    HCT 37.3 (*)    Neutro Abs 9.0 (*)    Monocytes Absolute 1.1 (*)    All other components within normal limits  URINALYSIS, ROUTINE W REFLEX MICROSCOPIC - Abnormal; Notable for the following components:   Color, Urine YELLOW (*)    APPearance CLEAR (*)    Specific Gravity, Urine 1.004 (*)    All other components within normal limits  MAGNESIUM - Abnormal; Notable for the following components:   Magnesium 1.0 (*)    All other components within normal limits  RESP PANEL BY RT-PCR (RSV, FLU A&B, COVID)  RVPGX2  LIPASE, BLOOD     EKG     RADIOLOGY I independently reviewed and interpreted imaging and agree with radiologists findings.     PROCEDURES:  Critical Care performed:   Procedures   MEDICATIONS ORDERED IN ED: Medications  sodium chloride 0.9 % bolus 1,000 mL (0 mLs Intravenous Stopped 03/12/22 1352)  magnesium sulfate IVPB 2 g 50 mL (0 g Intravenous Stopped 03/12/22 1439)  potassium chloride 10 mEq in 100 mL IVPB (0 mEq Intravenous Stopped 03/12/22 1352)  morphine (PF) 2 MG/ML injection 2 mg (2 mg Intravenous Given 03/12/22 1212)  iohexol (OMNIPAQUE) 350 MG/ML injection 100 mL (100 mLs Intravenous Contrast Given 03/12/22 1221)     IMPRESSION / MDM / ASSESSMENT AND PLAN / ED COURSE  I reviewed the triage vital signs and the nursing notes.   Differential diagnosis includes, but is not limited to, gastroenteritis, electrolyte disarray, COVID, flu, SBP.  I reviewed the patient's chart.  Patient was admitted to the hospitalist in November 2023 for abdominal pain, new onset cirrhosis and an elevated bilirubin to 13.  He was seen by Dr. Marius Ditch with gastroenterology.  He was found to have decompensated cirrhosis of the liver secondary to alcohol abuse and fatty liver.  His secondary liver disease workup has been negative.  He had a paracentesis  that did not show evidence of SBP.  He had an MRCP that did not show evidence of biliary obstruction  or any liver lesions.  Patient is awake and alert, mildly tachycardic to 102 on arrival though afebrile without taking any antipyretics.  He was placed on the cardiac monitor.  Labs obtained demonstrate electrolyte disarray with hypomagnesemia and hypokalemia.  His bilirubin remains elevated, though is significantly improved from his previous of 13.  He does not appear to be jaundiced.  Performed a bedside ultrasound prior to CT scan and did not find a pocket of ascitic fluid. CT scan obtained demonstrates no ascites or other acute intra-abdominal pathology.  There is no ascites on CT scan nor on bedside ultrasound, therefore unlikely to be SBP.  Given normal CAT scan, and the fact that he is afebrile in the emergency department, no indication for paracentesis.  Symptoms most consistent with a viral gastroenteritis.  His electrolytes were repleted.  He was treated symptomatically with improvement of his symptoms.  Upon reevaluation, he reports that he feels significantly improved and feels ready for discharge home.  He is able to tolerate p.o.  He is ambulatory with a steady gait.  We discussed the importance of close outpatient follow-up with his outpatient providers including gastroenterology and he was given the appropriate contact information.  We also discussed return precautions.  Patient understands and agrees with plan.  He was discharged in stable condition.  Per RN, RN discussed with legal guardian.  Patient was also seen and evaluated by Dr. Corky Downs who agrees with assessment and plan.  Patient's presentation is most consistent with acute complicated illness / injury requiring diagnostic workup.       FINAL CLINICAL IMPRESSION(S) / ED DIAGNOSES   Final diagnoses:  Nausea vomiting and diarrhea  Hypokalemia  Hypomagnesemia  Cirrhosis of liver without ascites, unspecified hepatic cirrhosis  type ([place])  Hyperbilirubinemia     Rx / DC Orders   ED Discharge Orders     None        Note:  This document was prepared using Dragon voice recognition software and may include unintentional dictation errors.   Emeline Gins 03/12/22 1442    Lavonia Drafts, MD 03/12/22 1444

## 2022-03-12 NOTE — ED Triage Notes (Signed)
Presents with a 2 day hx of n/v/ and fever  States fever at home was 102  Pt is currently afebrile   Last time vomited was 9 am  Also having some discomfort across chest and lateral rib area

## 2022-03-12 NOTE — Discharge Instructions (Signed)
Follow-up with gastroenterology.  Your CT scan today shows that you still have cirrhosis, though there is no fluid to tap.  Please return for any new, worsening, or change in symptoms or other concerns.  It was a pleasure caring for you today.

## 2022-05-11 ENCOUNTER — Inpatient Hospital Stay: Payer: Medicaid Other

## 2022-05-11 ENCOUNTER — Inpatient Hospital Stay
Admission: EM | Admit: 2022-05-11 | Discharge: 2022-06-02 | DRG: 433 | Disposition: A | Discharge status: Home / Self Care | Payer: Medicaid Other | Attending: Internal Medicine | Admitting: Internal Medicine

## 2022-05-11 ENCOUNTER — Emergency Department: Payer: Medicaid Other

## 2022-05-11 ENCOUNTER — Other Ambulatory Visit: Payer: Self-pay

## 2022-05-11 DIAGNOSIS — R140 Abdominal distension (gaseous): Secondary | ICD-10-CM | POA: Diagnosis present

## 2022-05-11 DIAGNOSIS — E871 Hypo-osmolality and hyponatremia: Secondary | ICD-10-CM | POA: Diagnosis present

## 2022-05-11 DIAGNOSIS — M7981 Nontraumatic hematoma of soft tissue: Secondary | ICD-10-CM | POA: Diagnosis not present

## 2022-05-11 DIAGNOSIS — Z5982 Transportation insecurity: Secondary | ICD-10-CM

## 2022-05-11 DIAGNOSIS — K7030 Alcoholic cirrhosis of liver without ascites: Secondary | ICD-10-CM | POA: Diagnosis not present

## 2022-05-11 DIAGNOSIS — K828 Other specified diseases of gallbladder: Secondary | ICD-10-CM | POA: Diagnosis present

## 2022-05-11 DIAGNOSIS — D638 Anemia in other chronic diseases classified elsewhere: Secondary | ICD-10-CM | POA: Diagnosis present

## 2022-05-11 DIAGNOSIS — T380X5A Adverse effect of glucocorticoids and synthetic analogues, initial encounter: Secondary | ICD-10-CM | POA: Diagnosis present

## 2022-05-11 DIAGNOSIS — D72829 Elevated white blood cell count, unspecified: Secondary | ICD-10-CM | POA: Diagnosis not present

## 2022-05-11 DIAGNOSIS — Z6837 Body mass index (BMI) 37.0-37.9, adult: Secondary | ICD-10-CM

## 2022-05-11 DIAGNOSIS — K766 Portal hypertension: Secondary | ICD-10-CM | POA: Diagnosis present

## 2022-05-11 DIAGNOSIS — D72828 Other elevated white blood cell count: Secondary | ICD-10-CM | POA: Diagnosis present

## 2022-05-11 DIAGNOSIS — R1084 Generalized abdominal pain: Principal | ICD-10-CM

## 2022-05-11 DIAGNOSIS — Z9049 Acquired absence of other specified parts of digestive tract: Secondary | ICD-10-CM | POA: Diagnosis not present

## 2022-05-11 DIAGNOSIS — Z8261 Family history of arthritis: Secondary | ICD-10-CM

## 2022-05-11 DIAGNOSIS — Z1152 Encounter for screening for COVID-19: Secondary | ICD-10-CM

## 2022-05-11 DIAGNOSIS — E876 Hypokalemia: Secondary | ICD-10-CM | POA: Diagnosis present

## 2022-05-11 DIAGNOSIS — K7682 Hepatic encephalopathy: Secondary | ICD-10-CM

## 2022-05-11 DIAGNOSIS — R509 Fever, unspecified: Secondary | ICD-10-CM

## 2022-05-11 DIAGNOSIS — E669 Obesity, unspecified: Secondary | ICD-10-CM | POA: Diagnosis present

## 2022-05-11 DIAGNOSIS — I110 Hypertensive heart disease with heart failure: Secondary | ICD-10-CM | POA: Diagnosis present

## 2022-05-11 DIAGNOSIS — K7031 Alcoholic cirrhosis of liver with ascites: Secondary | ICD-10-CM | POA: Diagnosis present

## 2022-05-11 DIAGNOSIS — M79652 Pain in left thigh: Secondary | ICD-10-CM

## 2022-05-11 DIAGNOSIS — F10239 Alcohol dependence with withdrawal, unspecified: Secondary | ICD-10-CM | POA: Diagnosis present

## 2022-05-11 DIAGNOSIS — Z515 Encounter for palliative care: Secondary | ICD-10-CM | POA: Diagnosis not present

## 2022-05-11 DIAGNOSIS — T500X5A Adverse effect of mineralocorticoids and their antagonists, initial encounter: Secondary | ICD-10-CM | POA: Diagnosis present

## 2022-05-11 DIAGNOSIS — Z79899 Other long term (current) drug therapy: Secondary | ICD-10-CM | POA: Diagnosis not present

## 2022-05-11 DIAGNOSIS — R188 Other ascites: Secondary | ICD-10-CM | POA: Diagnosis present

## 2022-05-11 DIAGNOSIS — F10230 Alcohol dependence with withdrawal, uncomplicated: Secondary | ICD-10-CM | POA: Diagnosis not present

## 2022-05-11 DIAGNOSIS — I5032 Chronic diastolic (congestive) heart failure: Secondary | ICD-10-CM | POA: Diagnosis present

## 2022-05-11 DIAGNOSIS — K7040 Alcoholic hepatic failure without coma: Secondary | ICD-10-CM | POA: Diagnosis present

## 2022-05-11 DIAGNOSIS — R609 Edema, unspecified: Secondary | ICD-10-CM

## 2022-05-11 DIAGNOSIS — K219 Gastro-esophageal reflux disease without esophagitis: Secondary | ICD-10-CM | POA: Diagnosis present

## 2022-05-11 DIAGNOSIS — Z7189 Other specified counseling: Secondary | ICD-10-CM | POA: Diagnosis not present

## 2022-05-11 DIAGNOSIS — K7011 Alcoholic hepatitis with ascites: Secondary | ICD-10-CM | POA: Diagnosis present

## 2022-05-11 DIAGNOSIS — R17 Unspecified jaundice: Secondary | ICD-10-CM | POA: Diagnosis not present

## 2022-05-11 DIAGNOSIS — I517 Cardiomegaly: Secondary | ICD-10-CM | POA: Diagnosis not present

## 2022-05-11 LAB — CREATININE, SERUM: Creatinine, Ser: <0.3 mg/dL — ABNORMAL LOW (ref 0.61–1.24)

## 2022-05-11 LAB — URINALYSIS, ROUTINE W REFLEX MICROSCOPIC
Glucose, UA: NEGATIVE mg/dL
Ketones, ur: NEGATIVE mg/dL
Leukocytes,Ua: NEGATIVE
Nitrite: NEGATIVE
Protein, ur: NEGATIVE mg/dL
Specific Gravity, Urine: 1.033 — ABNORMAL HIGH (ref 1.005–1.030)
Squamous Epithelial / HPF: NONE SEEN /HPF (ref 0–5)
pH: 7 (ref 5.0–8.0)

## 2022-05-11 LAB — CBC
HCT: 31.2 % — ABNORMAL LOW (ref 39.0–52.0)
HCT: 27.9 % — ABNORMAL LOW (ref 39.0–52.0)
Hemoglobin: 10.9 g/dL — ABNORMAL LOW (ref 13.0–17.0)
Hemoglobin: 9.8 g/dL — ABNORMAL LOW (ref 13.0–17.0)
MCH: 36.6 pg — ABNORMAL HIGH (ref 26.0–34.0)
MCH: 36.7 pg — ABNORMAL HIGH (ref 26.0–34.0)
MCHC: 34.9 g/dL (ref 30.0–36.0)
MCHC: 35.1 g/dL (ref 30.0–36.0)
MCV: 104.7 fL — ABNORMAL HIGH (ref 80.0–100.0)
MCV: 104.5 fL — ABNORMAL HIGH (ref 80.0–100.0)
Platelets: 192 10*3/uL (ref 150–400)
Platelets: 173 10*3/uL (ref 150–400)
RBC: 2.98 MIL/uL — ABNORMAL LOW (ref 4.22–5.81)
RBC: 2.67 MIL/uL — ABNORMAL LOW (ref 4.22–5.81)
RDW: 21.6 % — ABNORMAL HIGH (ref 11.5–15.5)
RDW: 21.6 % — ABNORMAL HIGH (ref 11.5–15.5)
WBC: 14.4 10*3/uL — ABNORMAL HIGH (ref 4.0–10.5)
WBC: 13.5 10*3/uL — ABNORMAL HIGH (ref 4.0–10.5)
nRBC: 0 % (ref 0.0–0.2)
nRBC: 0.1 % (ref 0.0–0.2)

## 2022-05-11 LAB — COMPREHENSIVE METABOLIC PANEL
ALT: 21 U/L (ref 0–44)
AST: 169 U/L — ABNORMAL HIGH (ref 15–41)
Albumin: 2.1 g/dL — ABNORMAL LOW (ref 3.5–5.0)
Alkaline Phosphatase: 107 U/L (ref 38–126)
Anion gap: 13 (ref 5–15)
BUN: <5 mg/dL — ABNORMAL LOW (ref 6–20)
CO2: 28 mmol/L (ref 22–32)
Calcium: 7.9 mg/dL — ABNORMAL LOW (ref 8.9–10.3)
Chloride: 90 mmol/L — ABNORMAL LOW (ref 98–111)
Creatinine, Ser: 0.33 mg/dL — ABNORMAL LOW (ref 0.61–1.24)
GFR, Estimated: >60 mL/min (ref >60)
Glucose, Bld: 111 mg/dL — ABNORMAL HIGH (ref 70–99)
Potassium: 2.5 mmol/L — CL (ref 3.5–5.1)
Sodium: 131 mmol/L — ABNORMAL LOW (ref 135–145)
Total Bilirubin: 28.4 mg/dL (ref 0.3–1.2)
Total Protein: 9.4 g/dL — ABNORMAL HIGH (ref 6.5–8.1)

## 2022-05-11 LAB — MAGNESIUM: Magnesium: 1.8 mg/dL (ref 1.7–2.4)

## 2022-05-11 LAB — LIPASE, BLOOD: Lipase: 75 U/L — ABNORMAL HIGH (ref 11–51)

## 2022-05-11 LAB — BILIRUBIN, DIRECT: Bilirubin, Direct: 13.4 mg/dL — ABNORMAL HIGH (ref 0.0–0.2)

## 2022-05-11 MED ORDER — FUROSEMIDE 10 MG/ML IJ SOLN
40.0000 mg | Freq: Once | INTRAMUSCULAR | Status: DC
  Filled 2022-05-11: qty 4

## 2022-05-11 MED ORDER — MORPHINE SULFATE (PF) 4 MG/ML IV SOLN
4.0000 mg | Freq: Once | INTRAVENOUS | Status: AC
  Administered 2022-05-11: 4 mg via INTRAVENOUS
  Filled 2022-05-11: qty 1

## 2022-05-11 MED ORDER — POTASSIUM CHLORIDE CRYS ER 20 MEQ PO TBCR
40.0000 meq | EXTENDED_RELEASE_TABLET | Freq: Once | ORAL | Status: AC
  Administered 2022-05-11: 40 meq via ORAL
  Filled 2022-05-11: qty 2

## 2022-05-11 MED ORDER — ADULT MULTIVITAMIN W/MINERALS CH
1.0000 | ORAL_TABLET | Freq: Every day | ORAL | Status: DC
  Administered 2022-05-11 – 2022-06-02 (×23): 1 via ORAL
  Filled 2022-05-11 (×23): qty 1

## 2022-05-11 MED ORDER — THIAMINE MONONITRATE 100 MG PO TABS
100.0000 mg | ORAL_TABLET | Freq: Every day | ORAL | Status: DC
  Administered 2022-05-11 – 2022-06-02 (×23): 100 mg via ORAL
  Filled 2022-05-11 (×23): qty 1

## 2022-05-11 MED ORDER — ONDANSETRON HCL 4 MG/2ML IJ SOLN
4.0000 mg | Freq: Four times a day (QID) | INTRAMUSCULAR | Status: DC | PRN
  Administered 2022-05-16: 4 mg via INTRAVENOUS
  Filled 2022-05-11: qty 2

## 2022-05-11 MED ORDER — ONDANSETRON HCL 4 MG PO TABS: 4.0000 mg | ORAL_TABLET | Freq: Four times a day (QID) | ORAL | Status: DC | PRN

## 2022-05-11 MED ORDER — SODIUM CHLORIDE 0.9 % IV SOLN
1.0000 g | Freq: Once | INTRAVENOUS | Status: AC
  Administered 2022-05-11: 1 g via INTRAVENOUS
  Filled 2022-05-11: qty 10

## 2022-05-11 MED ORDER — AMLODIPINE BESYLATE 5 MG PO TABS
5.0000 mg | ORAL_TABLET | Freq: Every day | ORAL | Status: DC
  Administered 2022-05-11 – 2022-06-02 (×23): 5 mg via ORAL
  Filled 2022-05-11 (×23): qty 1

## 2022-05-11 MED ORDER — DOCUSATE SODIUM 100 MG PO CAPS
100.0000 mg | ORAL_CAPSULE | Freq: Two times a day (BID) | ORAL | Status: DC
  Administered 2022-05-11 – 2022-06-02 (×44): 100 mg via ORAL
  Filled 2022-05-11 (×46): qty 1

## 2022-05-11 MED ORDER — ACETAMINOPHEN 650 MG RE SUPP: 650.0000 mg | Freq: Four times a day (QID) | RECTAL | Status: DC | PRN

## 2022-05-11 MED ORDER — POTASSIUM CHLORIDE 20 MEQ PO PACK
40.0000 meq | PACK | Freq: Once | ORAL | Status: AC
  Administered 2022-05-11: 40 meq via ORAL
  Filled 2022-05-11: qty 2

## 2022-05-11 MED ORDER — ONDANSETRON HCL 4 MG/2ML IJ SOLN
4.0000 mg | Freq: Once | INTRAMUSCULAR | Status: AC
  Administered 2022-05-11: 4 mg via INTRAVENOUS
  Filled 2022-05-11: qty 2

## 2022-05-11 MED ORDER — LIDOCAINE HCL (PF) 1 % IJ SOLN
10.0000 mL | Freq: Once | INTRAMUSCULAR | Status: AC
  Administered 2022-05-11: 10 mL via INTRADERMAL

## 2022-05-11 MED ORDER — METRONIDAZOLE 500 MG/100ML IV SOLN: 500.0000 mg | Freq: Three times a day (TID) | INTRAVENOUS | Status: DC

## 2022-05-11 MED ORDER — SENNA 8.6 MG PO TABS
1.0000 | ORAL_TABLET | Freq: Two times a day (BID) | ORAL | Status: DC
  Administered 2022-05-11 – 2022-06-02 (×44): 8.6 mg via ORAL
  Filled 2022-05-11 (×45): qty 1

## 2022-05-11 MED ORDER — THIAMINE HCL 100 MG/ML IJ SOLN: 100.0000 mg | Freq: Every day | INTRAMUSCULAR | Status: DC

## 2022-05-11 MED ORDER — ACETAMINOPHEN 325 MG PO TABS
650.0000 mg | ORAL_TABLET | Freq: Four times a day (QID) | ORAL | Status: DC | PRN
  Administered 2022-05-12 – 2022-05-13 (×2): 650 mg via ORAL
  Filled 2022-05-11 (×2): qty 2

## 2022-05-11 MED ORDER — THIAMINE MONONITRATE 100 MG PO TABS: 100.0000 mg | ORAL_TABLET | Freq: Every day | ORAL | Status: DC

## 2022-05-11 MED ORDER — METRONIDAZOLE 500 MG/100ML IV SOLN
500.0000 mg | Freq: Once | INTRAVENOUS | Status: AC
  Administered 2022-05-11: 500 mg via INTRAVENOUS
  Filled 2022-05-11: qty 100

## 2022-05-11 MED ORDER — DIAZEPAM 5 MG/ML IJ SOLN
10.0000 mg | Freq: Once | INTRAMUSCULAR | Status: AC
  Administered 2022-05-11: 10 mg via INTRAVENOUS
  Filled 2022-05-11: qty 2

## 2022-05-11 MED ORDER — POTASSIUM CHLORIDE 10 MEQ/100ML IV SOLN
10.0000 meq | INTRAVENOUS | Status: AC
  Administered 2022-05-11 (×2): 10 meq via INTRAVENOUS
  Filled 2022-05-11 (×2): qty 100

## 2022-05-11 MED ORDER — SODIUM CHLORIDE 0.9 % IV SOLN
2.0000 g | INTRAVENOUS | Status: AC
  Administered 2022-05-12 – 2022-05-17 (×6): 2 g via INTRAVENOUS
  Filled 2022-05-11 (×2): qty 2
  Filled 2022-05-11 (×5): qty 20

## 2022-05-11 MED ORDER — IOHEXOL 300 MG/ML  SOLN
100.0000 mL | Freq: Once | INTRAMUSCULAR | Status: AC | PRN
  Administered 2022-05-11: 100 mL via INTRAVENOUS

## 2022-05-11 MED ORDER — SODIUM CHLORIDE 0.9 % IV SOLN: 1.0000 g | Freq: Once | INTRAVENOUS | Status: DC

## 2022-05-11 MED ORDER — ENOXAPARIN SODIUM 60 MG/0.6ML IJ SOSY
0.5000 mg/kg | PREFILLED_SYRINGE | INTRAMUSCULAR | Status: DC
  Administered 2022-05-11 – 2022-05-23 (×13): 62.5 mg via SUBCUTANEOUS
  Filled 2022-05-11 (×13): qty 1.2

## 2022-05-11 MED ORDER — LORAZEPAM 1 MG PO TABS
1.0000 mg | ORAL_TABLET | ORAL | Status: AC | PRN
  Administered 2022-05-11 – 2022-05-12 (×3): 1 mg via ORAL
  Administered 2022-05-12: 2 mg via ORAL
  Administered 2022-05-13 (×2): 1 mg via ORAL
  Filled 2022-05-11 (×4): qty 1
  Filled 2022-05-11: qty 2
  Filled 2022-05-11: qty 1

## 2022-05-11 MED ORDER — FUROSEMIDE 40 MG PO TABS
40.0000 mg | ORAL_TABLET | Freq: Every day | ORAL | Status: DC
  Administered 2022-05-11 – 2022-05-13 (×3): 40 mg via ORAL
  Filled 2022-05-11 (×4): qty 1

## 2022-05-11 MED ORDER — FOLIC ACID 1 MG PO TABS
1.0000 mg | ORAL_TABLET | Freq: Every day | ORAL | Status: DC
  Administered 2022-05-11: 1 mg via ORAL
  Filled 2022-05-11: qty 1

## 2022-05-11 MED ORDER — METRONIDAZOLE 500 MG/100ML IV SOLN
500.0000 mg | Freq: Two times a day (BID) | INTRAVENOUS | Status: DC
  Administered 2022-05-11 – 2022-05-15 (×9): 500 mg via INTRAVENOUS
  Filled 2022-05-11 (×10): qty 100

## 2022-05-11 MED ORDER — SPIRONOLACTONE 25 MG PO TABS
100.0000 mg | ORAL_TABLET | Freq: Every day | ORAL | Status: DC
  Administered 2022-05-11 – 2022-05-13 (×3): 100 mg via ORAL
  Filled 2022-05-11 (×4): qty 4

## 2022-05-11 MED ORDER — FOLIC ACID 1 MG PO TABS
1.0000 mg | ORAL_TABLET | Freq: Every day | ORAL | Status: DC
  Administered 2022-05-12 – 2022-06-02 (×22): 1 mg via ORAL
  Filled 2022-05-11 (×22): qty 1

## 2022-05-11 MED ORDER — OXYCODONE HCL 5 MG PO TABS
5.0000 mg | ORAL_TABLET | ORAL | Status: DC | PRN
  Administered 2022-05-11 – 2022-05-25 (×38): 5 mg via ORAL
  Filled 2022-05-11 (×38): qty 1

## 2022-05-11 NOTE — Procedures (Signed)
  Procedure:  US  paracentesis RLQ Preprocedure diagnosis: The primary encounter diagnosis was Generalized abdominal pain. Diagnoses of Alcoholic cirrhosis, unspecified whether ascites present ([place]), Subjective fever, Swelling, and Hypokalemia were also pertinent to this visit. Postprocedure diagnosis: same EBL:    minimal Complications:   none immediate Specimen: scant clear amber fluid, for GS C&S See full dictation in [employer].  Dillard Cannon MD Main # [phone] Pager  [phone] Mobile [phone]

## 2022-05-11 NOTE — ED Notes (Signed)
Patient at US

## 2022-05-11 NOTE — ED Provider Notes (Addendum)
[facility] Provider Note    Event Date/Time   First MD Initiated Contact with Patient 05/11/22 1006     (approximate)   History   Abdominal Pain (Hx liver cirrhosis )   HPI  Cameron Santiago is a 27 y.o. male   Past medical history of cirrhosis, presents emergency department with diffuse swelling in the abdomen and bilateral upper and lower extremities as well as abdominal pain and subjective fevers at night with chills.  He has been nauseous but not vomiting.  He denies GI bleeding.  He has no diarrhea.  He denies dysuria.  Last drink was last night.  The skin has become yellow.  No other acute medical complaints.  External Medical Documents Reviewed: Emergency department visit dated March 12, 2022 with abdominal pain and subjective fevers       Physical Exam   Triage Vital Signs: ED Triage Vitals  Enc Vitals Group     BP 05/11/22 0935 (!) 151/88     Pulse Rate 05/11/22 0935 97     Resp 05/11/22 0935 18     Temp 05/11/22 0935 98.1 F (36.7 C)     Temp Source 05/11/22 0935 Oral     SpO2 05/11/22 0935 96 %     Weight 05/11/22 0935 271 lb (122.9 kg)     Height 05/11/22 0935 5\' 11"  (1.803 m)     Head Circumference --      Peak Flow --      Pain Score 05/11/22 0941 7     Pain Loc --      Pain Edu? --      Excl. in [place]? --     Most recent vital signs: Vitals:   05/11/22 1421 05/11/22 1430  BP:  135/72  Pulse:  96  Resp:  (!) 27  Temp: 98.4 F (36.9 C)   SpO2:  96%    General: Awake, no distress.  Anxious appearing with mild tremors of the tongue. CV:  Good peripheral perfusion.  Resp:  Normal effort.  Abd:  No distention.  Other:  Jaundiced throughout.  Abdominal distention that is tender to palpation.  Swelling of bilateral upper and lower extremities.  Neck supple with full range of motion afebrile nontoxic-appearing overall.  Lungs clear.  Awake alert and oriented mentation is normal.   ED Results / Procedures / Treatments    Labs (all labs ordered are listed, but only abnormal results are displayed) Labs Reviewed  LIPASE, BLOOD - Abnormal; Notable for the following components:      Result Value   Lipase 75 (*)    All other components within normal limits  COMPREHENSIVE METABOLIC PANEL - Abnormal; Notable for the following components:   Sodium 131 (*)    Potassium 2.5 (*)    Chloride 90 (*)    Glucose, Bld 111 (*)    BUN <5 (*)    Creatinine, Ser 0.33 (*)    Calcium 7.9 (*)    Total Protein 9.4 (*)    Albumin 2.1 (*)    AST 169 (*)    Total Bilirubin 28.4 (*)    All other components within normal limits  CBC - Abnormal; Notable for the following components:   WBC 14.4 (*)    RBC 2.98 (*)    Hemoglobin 10.9 (*)    HCT 31.2 (*)    MCV 104.7 (*)    MCH 36.6 (*)    RDW 21.6 (*)    All  other components within normal limits  MAGNESIUM  URINALYSIS, ROUTINE W REFLEX MICROSCOPIC  BILIRUBIN, DIRECT  CBC  CREATININE, SERUM     I ordered and reviewed the above labs they are notable for hyperbilirubinemia profound at 20s, hypokalemia  EKG  ED ECG REPORT I, Lucillie Garfinkel, the attending physician, personally viewed and interpreted this ECG.   Date: 05/11/2022  EKG Time: 1108  Rate: 97  Rhythm: normal sinus rhythm  Axis: nl  Intervals:long QTC   ST&T Change: No acute ischemic changes    RADIOLOGY I independently reviewed and interpreted CT scan of the abdomen pelvis and see no obvious gallstones but I do see a distended gallbladder   PROCEDURES:  Critical Care performed: No  Procedures   MEDICATIONS ORDERED IN ED: Medications  LORazepam (ATIVAN) tablet 1-4 mg (has no administration in time range)  thiamine (VITAMIN B1) tablet 100 mg (100 mg Oral Given 05/11/22 1110)    Or  thiamine (VITAMIN B1) injection 100 mg ( Intravenous See Alternative [id number])  folic acid (FOLVITE) tablet 1 mg (1 mg Oral Given 05/11/22 1110)  multivitamin with minerals tablet 1 tablet (1 tablet Oral Given  05/11/22 1110)  metroNIDAZOLE (FLAGYL) IVPB 500 mg (500 mg Intravenous New Bag/Given 05/11/22 1430)  enoxaparin (LOVENOX) injection 40 mg (has no administration in time range)  acetaminophen (TYLENOL) tablet 650 mg (has no administration in time range)    Or  acetaminophen (TYLENOL) suppository 650 mg (has no administration in time range)  oxyCODONE (Oxy IR/ROXICODONE) immediate release tablet 5 mg (has no administration in time range)  docusate sodium (COLACE) capsule 100 mg (has no administration in time range)  senna (SENOKOT) tablet 8.6 mg (has no administration in time range)  ondansetron (ZOFRAN) tablet 4 mg (has no administration in time range)    Or  ondansetron (ZOFRAN) injection 4 mg (has no administration in time range)  folic acid (FOLVITE) tablet 1 mg (has no administration in time range)  thiamine (VITAMIN B1) tablet 100 mg (has no administration in time range)  morphine (PF) 4 MG/ML injection 4 mg (4 mg Intravenous Given 05/11/22 1110)  ondansetron (ZOFRAN) injection 4 mg (4 mg Intravenous Given 05/11/22 1109)  diazepam (VALIUM) injection 10 mg (10 mg Intravenous Given 05/11/22 1110)  cefTRIAXone (ROCEPHIN) 1 g in sodium chloride 0.9 % 100 mL IVPB (0 g Intravenous Stopped 05/11/22 1220)  potassium chloride SA (KLOR-CON M) CR tablet 40 mEq (40 mEq Oral Given 05/11/22 1110)  potassium chloride 10 mEq in 100 mL IVPB (0 mEq Intravenous Stopped 05/11/22 1421)  iohexol (OMNIPAQUE) 300 MG/ML solution 100 mL (100 mLs Intravenous Contrast Given 05/11/22 1145)  morphine (PF) 4 MG/ML injection 4 mg (4 mg Intravenous Given 05/11/22 1231)    External physician / consultants:  I spoke with hospitalist for admission and regarding care plan for this patient.   IMPRESSION / MDM / ASSESSMENT AND PLAN / ED COURSE  I reviewed the triage vital signs and the nursing notes.                                Patient's presentation is most consistent with acute presentation with potential threat to life  or bodily function.  Differential diagnosis includes, but is not limited to, SBP, electrolyte disturbance, decompensated alcoholic cirrhosis, ascites, anasarca, etoh wd   The patient is on the cardiac monitor to evaluate for evidence of arrhythmia and/or significant heart rate changes.  MDM:  Is a patient with alcoholic cirrhosis and ongoing alcohol use last yesterday with some mild withdrawal symptoms will give IV Valium and placed on CIWA.  Has diffuse swelling and abdominal distention along with pain and subjective fevers tenderness to palpation will empirically treat for SBP obtain imaging and bedside ultrasound for safe pocket for diagnostic paracentesis.  Profound hypokalemia 2.5 with QTc prolongation will replete.  Check magnesium.  Hyperbilirubinemia noted on lab testing, imaging pending.  Mentation has been good about hepatic encephalopathy  Given the electrolyte disturbance and concern for SBP, will require admission.  Bedside ultrasound shows ascites but bowel loops float into the small pocket of fluid in the safe areas for diagnostic paracentesis, and despite patient positioning I cannot confidently find a large enough pocket to safely perform this procedure so we will defer at this time.  He has gotten IV Rocephin for empiric treatment of SBP given ascites abdominal pain subjective fevers and white blood cell count.  Admission is pending the results of his right upper quadrant ultrasound performed by ultrasonographer, awaiting formal radiologist read for any surgical pathology or GI procedural pathologies prior to admission to hospitalist service.  Ultrasound shows equivocal findings with mild wall thickening questionable cholecystitis, I contacted Dr. Lysle Pearl of general surgery who will assess the patient further.  I added IV Flagyl to cover for intra-abdominal infection. Admission.       FINAL CLINICAL IMPRESSION(S) / ED DIAGNOSES   Final diagnoses:  Generalized abdominal  pain  Alcoholic cirrhosis, unspecified whether ascites present (HCC)  Subjective fever  Swelling  Hypokalemia     Rx / DC Orders   ED Discharge Orders     None        Note:  This document was prepared using Dragon voice recognition software and may include unintentional dictation errors.    Lucillie Garfinkel, MD 05/11/22 1404    Lucillie Garfinkel, MD 05/11/22 [phone]

## 2022-05-11 NOTE — Consult Note (Signed)
Subjective:   CC: Abdominal pain and jaundice  HPI:  Cameron Santiago is a 27 y.o. male who is consulted by Jacelyn Grip for evaluation of above cc.  Symptoms were first noted a few days ago. Pain is sharp, mostly epigastric and right upper quadrant but also notes lower abdomen as well.  Associated with extremity swelling and abdominal swelling, exacerbated by nothing specific     Past Medical History:  has a past medical history of Alcohol abuse, Alcoholic pancreatitis, and GERD (gastroesophageal reflux disease).  Past Surgical History:  has a past surgical history that includes Tonsillectomy; laparoscopic appendectomy (N/A, 02/15/2015); and Appendectomy.  Family History: family history includes Arthritis in his mother.  Social History:  reports that he has never smoked. He has never used smokeless tobacco. He reports current alcohol use. He reports that he does not use drugs.  Current Medications:  Prior to Admission medications   Medication Sig Start Date End Date Taking? Authorizing Provider  albuterol (VENTOLIN HFA) 108 (90 Base) MCG/ACT inhaler Inhale 2 puffs into the lungs every 6 (six) hours as needed for wheezing or shortness of breath. 11/22/19   Marlana Salvage, PA  amLODipine (NORVASC) 5 MG tablet Take 1 tablet (5 mg total) by mouth daily. 07/17/21 08/16/21  Antonieta Pert, MD  folic acid (FOLVITE) 1 MG tablet Take 1 tablet (1 mg total) by mouth daily. 01/01/22   Wouk, Ailene Rud, MD  furosemide (LASIX) 40 MG tablet Take 1 tablet (40 mg total) by mouth daily. 01/01/22   Wouk, Ailene Rud, MD  Multiple Vitamin (MULTIVITAMIN WITH MINERALS) TABS tablet Take 1 tablet by mouth daily. 01/01/22   Wouk, Ailene Rud, MD  spironolactone (ALDACTONE) 100 MG tablet Take 1 tablet (100 mg total) by mouth daily. 01/01/22   Wouk, Ailene Rud, MD    Allergies:  Allergies as of 05/11/2022   (No Known Allergies)    ROS:  General: Denies weight loss, weight gain, fatigue, fevers, chills, and night  sweats. Eyes: Denies blurry vision, double vision, eye pain, itchy eyes, and tearing. Ears: Denies hearing loss, earache, and ringing in ears. Nose: Denies sinus pain, congestion, infections, runny nose, and nosebleeds. Mouth/throat: Denies hoarseness, sore throat, bleeding gums, and difficulty swallowing. Heart: Denies chest pain, palpitations, racing heart, irregular heartbeat, leg pain or swelling, and decreased activity tolerance. Respiratory: Denies breathing difficulty, shortness of breath, wheezing, cough, and sputum. GI: Denies change in appetite, heartburn, nausea, vomiting, constipation, diarrhea, and blood in stool. GU: Denies difficulty urinating, pain with urinating, urgency, frequency, blood in urine. Musculoskeletal: Denies joint stiffness, pain, swelling, muscle weakness. Skin: Denies rash, itching, mass, tumors, sores, and boils Neurologic: Denies headache, fainting, dizziness, seizures, numbness, and tingling. Psychiatric: Denies depression, anxiety, difficulty sleeping, and memory loss. Endocrine: Denies heat or cold intolerance, and increased thirst or urination. Blood/lymph: Denies easy bruising, and swollen glands     Objective:     BP 135/72   Pulse 96   Temp 98.4 F (36.9 C) (Oral)   Resp (!) 27   Ht 5\' 11"  (1.803 m)   Wt 122.9 kg   SpO2 96%   BMI 37.80 kg/m    Constitutional :  alert, cooperative, and icteric  Lymphatics/Throat:  no asymmetry, masses, or scars  Respiratory:  clear to auscultation bilaterally  Cardiovascular:  regular rate and rhythm  Gastrointestinal: Soft, no guarding, but distended with fluid wave, tenderness to palpation all 4 quadrants, reducible small umbilical hernia .   Musculoskeletal: Steady movement  Skin: Cool and  moist, lap surgical scars from appendectomy per verbal report  Psychiatric: Normal affect, non-agitated, not confused       LABS:     Latest Ref Rng & Units 05/11/2022    9:39 AM 03/12/2022   10:55 AM  01/01/2022    5:09 AM  CMP  Glucose 70 - 99 mg/dL 111  119  94   BUN 6 - 20 mg/dL <5  <5  <5   Creatinine 0.61 - 1.24 mg/dL 0.33  0.58  0.52   Sodium 135 - 145 mmol/L 131  133  137   Potassium 3.5 - 5.1 mmol/L 2.5  3.0  3.6   Chloride 98 - 111 mmol/L 90  91  100   CO2 22 - 32 mmol/L 28  28  29    Calcium 8.9 - 10.3 mg/dL 7.9  8.7  8.6   Total Protein 6.5 - 8.1 g/dL 9.4  9.1  7.6   Total Bilirubin 0.3 - 1.2 mg/dL 28.4  4.2  13.7   Alkaline Phos 38 - 126 U/L 107  127  108   AST 15 - 41 U/L 169  144  89   ALT 0 - 44 U/L 21  28  20        Latest Ref Rng & Units 05/11/2022    9:39 AM 03/12/2022   10:55 AM 12/30/2021    4:29 AM  CBC  WBC 4.0 - 10.5 K/uL 14.4  11.4  11.8   Hemoglobin 13.0 - 17.0 g/dL 10.9  12.6  13.0   Hematocrit 39.0 - 52.0 % 31.2  37.3  37.2   Platelets 150 - 400 K/uL 192  155  280      RADS: CLINICAL DATA: Abdominal pain  EXAM: ULTRASOUND ABDOMEN LIMITED RIGHT UPPER QUADRANT  COMPARISON: No prior right upper quadrant ultrasound available, correlation is made with 05/11/2022 CT abdomen pelvis  FINDINGS: Gallbladder:  Level sludge is noted within the gallbladder. No gallstones. The gallbladder is somewhat distended, with the wall measuring up to 4 mm, just above the upper limit of normal. No pericholecystic fluid. No sonographic Murphy sign noted by sonographer.  Common bile duct:  Diameter: 6 mm, within normal limits. No intrahepatic biliary ductal dilatation.  Liver:  No focal lesion identified. Increased parenchymal echogenicity, which limits sonographic penetration. Portal vein is patent on color Doppler imaging with likely reversal of blood flow, away from the liver.  Other: None.  IMPRESSION: 1. Gallbladder sludge and mild wall thickening, just above upper limit of normal. No pericholecystic fluid or sonographic Murphy sign. Findings are equivocal for acute cholecystitis and may be related to the patient's history of cirrhosis. 2. Portal  vein is patent with likely reversal of blood flow.   Electronically Signed By: Merilyn Baba M.D. On: 05/11/2022 14:17   CLINICAL DATA:  Abdominal pain   EXAM: CT ABDOMEN AND PELVIS WITH CONTRAST   TECHNIQUE: Multidetector CT imaging of the abdomen and pelvis was performed using the standard protocol following bolus administration of intravenous contrast.   RADIATION DOSE REDUCTION: This exam was performed according to the departmental dose-optimization program which includes automated exposure control, adjustment of the mA and/or kV according to patient size and/or use of iterative reconstruction technique.   CONTRAST:  100 mL OMNIPAQUE IOHEXOL 300 MG/ML  SOLN   COMPARISON:  03/12/2022   FINDINGS: Lower chest: Bibasilar linear subsegmental atelectasis. No pleural or pericardial effusion identified. Perigastric and perisplenic varices.   Hepatobiliary: Distended gallbladder. Enlarged liver with a slightly nodular contour.  No focal hepatic parenchymal lesions or biliary ductal dilatation identified.   Pancreas: Unremarkable. No pancreatic ductal dilatation or surrounding inflammatory changes.   Spleen: Grossly enlarged, 19 cm   Adrenals/Urinary Tract: Adrenal glands are unremarkable. Kidneys are normal, without renal calculi, focal lesion, or hydronephrosis. Bladder is unremarkable.   Stomach/Bowel: Stomach is within normal limits. Appendix appears normal. No evidence of bowel wall thickening, distention, or inflammatory changes.   Vascular/Lymphatic: No suspicious adenopathy identified. No aortic aneurysm.   Reproductive: Prostate is unremarkable.   Other: No abdominal wall hernia or abnormality. Small amounts of ascites noted along the pericolic gutters And in the pelvis.   Musculoskeletal: No acute or significant osseous findings.   IMPRESSION: 1. Hepatosplenomegaly. 2. Hepatic cirrhosis. 3. Distended gallbladder. 4. Perigastric and perisplenic  varices. 5. Small ascites.     Electronically Signed   By: Sammie Bench M.D.   On: 05/11/2022 12:05 Assessment:      Abdominal pain and jaundice, history of alcoholic cirrhosis  Plan:     Symptomatology likely from worsening liver cirrhosis, possible SBP, rather than cholecystitis.he would be a poor candidate for any surgical intervention due to his known cirrhosis and ascites fluid noted on recent CT scan.  Recommend continuing medical workup for his greatly elevated bilirubin level.  With no common bile duct dilation and only sludge in the gallbladder, obstructive etiology for his increased total bilirubin will be unlikely but he is pending a direct bilirubin level.  Further care per hospitalist team.  If direct bilirubin level is not elevated, surgery will sign off   labs/images/medications/previous chart entries reviewed personally and relevant changes/updates noted above.

## 2022-05-11 NOTE — ED Notes (Signed)
Lab called stating that fluid they were sent from pt paracentesis earlier today is not usable as there is not enough fluid in the provided specimen. Lab states they can send it to [facility] to be put onto a slide and possible get a result. RN to notify care team of lab update.

## 2022-05-11 NOTE — ED Triage Notes (Addendum)
Pt to ED via POV from home. Pt reports hx of liver cirrhosis due to daily etoh use. Pt reports abdominal distention, swelling in all extremities and pain. Pt reports last etoh consumed today. Pt jaundiced.

## 2022-05-11 NOTE — ED Notes (Signed)
Patient provided with ice water and fan per request.

## 2022-05-11 NOTE — ED Notes (Signed)
Patient's O2 dropped down into low 80's after receiving medication. Patient placed on 2L [place] with O2 at 96%.

## 2022-05-11 NOTE — Progress Notes (Signed)
PHARMACIST - PHYSICIAN COMMUNICATION  CONCERNING:  Enoxaparin (Lovenox) for DVT Prophylaxis   RECOMMENDATION: Patient was prescribed enoxaparin 40mg  q24 hours for VTE prophylaxis.   Filed Weights   05/11/22 0935  Weight: 122.9 kg (271 lb)    Body mass index is 37.8 kg/m.  Estimated Creatinine Clearance: 188.3 mL/min (A) (by C-G formula based on SCr of 0.33 mg/dL (L)).  Based on [place] patient is candidate for enoxaparin 0.5mg /kg TBW SQ every 24 hours based on BMI being >30.  DESCRIPTION: Pharmacy has adjusted enoxaparin dose per [facility] policy.  Patient is now receiving enoxaparin 62.5 mg every 24 hours   Benita Gutter 05/11/2022 2:48 PM

## 2022-05-11 NOTE — ED Notes (Signed)
Patient provided with water per request. Pt resting in bed. Pt breathing unlabored with symmetric chest rise and fall. Bed low and locked with side rails raised x2. Call bell in reach and monitor in place.

## 2022-05-11 NOTE — ED Notes (Signed)
Jacelyn Grip, MD made aware of critical result: potassium 2.5 and bilirubin 28.4

## 2022-05-11 NOTE — H&P (Addendum)
History and Physical    Cameron Santiago [id number] DOB: 1995-03-09 DOA: 05/11/2022  PCP: Pcp, No  Patient coming from: Home  I have personally briefly reviewed patient's old medical records in [place]  Chief Complaint: Abdominal distention associated with pain  HPI: Cameron Santiago is a 27 y.o. male with PMH significant for EtOH liver cirrhosis, ongoing EtOH use, Essential hypertension, presented in the ED with abdominal distention associated with abdominal pain for last few days.  Patient drinks  ETOH regularly, 1 pint of hard liquor every day.  Patient denies any nausea,  vomiting.  He describes abdominal pain as constant, 6/10 on pain scale associated with generalized body swelling. Patient also reports subjective fever at night with chills, He denies any GI bleeding, dizziness, weakness, urinary symptoms, denies any recent travel or  URI symptoms.  ED Course: He is hemodynamically stable except tachypnea. Temp 98.4, HR 96, RR 27, BP 137/72, SpO2 93% on room air Labs Include: Sodium 131, potassium 2.5, chloride 90,  bicarb 28, BUN 5, creatinine 0.33, glucose 111, calcium 7.9, anion gap 13, magnesium 1.8, alkaline phosphatase 107, albumin 2.1, lipase 75, AST 169, ALT 21, ALT 9.4, total bilirubin 28.4, total protein 9.4, WBC 14.4, hemoglobin 10.9, hematocrit 31.2, MCV 104.7, platelet 192, influenza negative, COVID-negative, RSV negative, UA: Ketones negative, LE negative, nitrate negative.  CTA/P:  Hepatosplenomegaly, Hepatic cirrhosis, Distended gallbladder, Perigastric and perisplenic varices.Small ascites. Abdominal ultrasound pending   Review of Systems:  Review of Systems  Constitutional:  Positive for chills, fever and malaise/fatigue.  HENT: Negative.    Eyes: Negative.   Respiratory: Negative.    Cardiovascular: Negative.   Gastrointestinal:  Positive for abdominal pain and heartburn.  Genitourinary: Negative.   Musculoskeletal: Negative.   Skin: Negative.    Neurological: Negative.   Endo/Heme/Allergies: Negative.   Psychiatric/Behavioral: Negative.       Past Medical History:  Diagnosis Date   Alcohol abuse    Alcoholic pancreatitis    GERD (gastroesophageal reflux disease)     Past Surgical History:  Procedure Laterality Date   APPENDECTOMY     LAPAROSCOPIC APPENDECTOMY N/A 02/15/2015   Procedure: APPENDECTOMY LAPAROSCOPIC;  Surgeon: Sherri Rad, MD;  Location: [facility] ORS;  Service: General;  Laterality: N/A;   TONSILLECTOMY       reports that he has never smoked. He has never used smokeless tobacco. He reports current alcohol use. He reports that he does not use drugs.  No Known Allergies  Family History  Problem Relation Age of Onset   Arthritis Mother    Family history reviewed and not pertinent.  Prior to Admission medications   Medication Sig Start Date End Date Taking? Authorizing Provider  albuterol (VENTOLIN HFA) 108 (90 Base) MCG/ACT inhaler Inhale 2 puffs into the lungs every 6 (six) hours as needed for wheezing or shortness of breath. 11/22/19   Marlana Salvage, PA  amLODipine (NORVASC) 5 MG tablet Take 1 tablet (5 mg total) by mouth daily. 07/17/21 08/16/21  Antonieta Pert, MD  folic acid (FOLVITE) 1 MG tablet Take 1 tablet (1 mg total) by mouth daily. 01/01/22   Wouk, Ailene Rud, MD  furosemide (LASIX) 40 MG tablet Take 1 tablet (40 mg total) by mouth daily. 01/01/22   Wouk, Ailene Rud, MD  Multiple Vitamin (MULTIVITAMIN WITH MINERALS) TABS tablet Take 1 tablet by mouth daily. 01/01/22   Wouk, Ailene Rud, MD  spironolactone (ALDACTONE) 100 MG tablet Take 1 tablet (100 mg total) by mouth daily. 01/01/22  Gwynne Edinger, MD    Physical Exam: Vitals:   05/11/22 1330 05/11/22 1400 05/11/22 1421 05/11/22 1430  BP: (!) 150/73 (!) 145/72  135/72  Pulse: 92 91  96  Resp: (!) 22 20  (!) 27  Temp:   98.4 F (36.9 C)   TempSrc:   Oral   SpO2: 95% 96%  96%  Weight:      Height:        Constitutional: Appears  comfortable, not in any acute distress.  Deconditioned. Vitals:   05/11/22 1330 05/11/22 1400 05/11/22 1421 05/11/22 1430  BP: (!) 150/73 (!) 145/72  135/72  Pulse: 92 91  96  Resp: (!) 22 20  (!) 27  Temp:   98.4 F (36.9 C)   TempSrc:   Oral   SpO2: 95% 96%  96%  Weight:      Height:       Eyes: PERRL, lids and conjunctivae normal ENMT: Mucous membranes are moist.  Posterior pharynx without exudate. Neck: normal, supple, no masses, no thyromegaly Respiratory: CTA bilaterally, respiratory effort normal, no accessory muscle use, RR 15 Cardiovascular: S1-S2 heard, regular rate and rhythm, no murmur. Abdomen: Abdomen is soft, significantly distended, tender, bowel sounds present Musculoskeletal: No edema, no cyanosis, no clubbing.  Good range of motion. Skin: no rashes, lesions, ulcers. No induration Neurologic: CN 2-12 grossly intact. Sensation intact, DTR normal. Strength 5/5 in all 4.  Psychiatric: Normal judgment and insight. Alert and oriented x 3. Normal mood.    Labs on Admission: I have personally reviewed following labs and imaging studies  CBC: Recent Labs  Lab 05/11/22 0939  WBC 14.4*  HGB 10.9*  HCT 31.2*  MCV 104.7*  PLT 062   Basic Metabolic Panel: Recent Labs  Lab 05/11/22 0939 05/11/22 1130  NA 131*  --   K 2.5*  --   CL 90*  --   CO2 28  --   GLUCOSE 111*  --   BUN <5*  --   CREATININE 0.33*  --   CALCIUM 7.9*  --   MG  --  1.8   GFR: Estimated Creatinine Clearance: 188.3 mL/min (A) (by C-G formula based on SCr of 0.33 mg/dL (L)). Liver Function Tests: Recent Labs  Lab 05/11/22 0939  AST 169*  ALT 21  ALKPHOS 107  BILITOT 28.4*  PROT 9.4*  ALBUMIN 2.1*   Recent Labs  Lab 05/11/22 0939  LIPASE 75*   No results for input(s): "AMMONIA" in the last 168 hours. Coagulation Profile: No results for input(s): "INR", "PROTIME" in the last 168 hours. Cardiac Enzymes: No results for input(s): "CKTOTAL", "CKMB", "CKMBINDEX", "TROPONINI" in  the last 168 hours. BNP (last 3 results) No results for input(s): "PROBNP" in the last 8760 hours. HbA1C: No results for input(s): "HGBA1C" in the last 72 hours. CBG: No results for input(s): "GLUCAP" in the last 168 hours. Lipid Profile: No results for input(s): "CHOL", "HDL", "LDLCALC", "TRIG", "CHOLHDL", "LDLDIRECT" in the last 72 hours. Thyroid Function Tests: No results for input(s): "TSH", "T4TOTAL", "FREET4", "T3FREE", "THYROIDAB" in the last 72 hours. Anemia Panel: No results for input(s): "VITAMINB12", "FOLATE", "FERRITIN", "TIBC", "IRON", "RETICCTPCT" in the last 72 hours. Urine analysis:    Component Value Date/Time   COLORURINE AMBER (A) 05/11/2022 1447   APPEARANCEUR CLEAR (A) 05/11/2022 1447   LABSPEC 1.033 (H) 05/11/2022 1447   PHURINE 7.0 05/11/2022 1447   GLUCOSEU NEGATIVE 05/11/2022 1447   HGBUR SMALL (A) 05/11/2022 1447   BILIRUBINUR MODERATE (A)  05/11/2022 [place] 05/11/2022 1447   PROTEINUR NEGATIVE 05/11/2022 1447   NITRITE NEGATIVE 05/11/2022 1447   LEUKOCYTESUR NEGATIVE 05/11/2022 1447    Radiological Exams on Admission: CT Abdomen Pelvis W Contrast  Result Date: 05/11/2022 CLINICAL DATA:  Abdominal pain EXAM: CT ABDOMEN AND PELVIS WITH CONTRAST TECHNIQUE: Multidetector CT imaging of the abdomen and pelvis was performed using the standard protocol following bolus administration of intravenous contrast. RADIATION DOSE REDUCTION: This exam was performed according to the departmental dose-optimization program which includes automated exposure control, adjustment of the mA and/or kV according to patient size and/or use of iterative reconstruction technique. CONTRAST:  100 mL OMNIPAQUE IOHEXOL 300 MG/ML  SOLN COMPARISON:  03/12/2022 FINDINGS: Lower chest: Bibasilar linear subsegmental atelectasis. No pleural or pericardial effusion identified. Perigastric and perisplenic varices. Hepatobiliary: Distended gallbladder. Enlarged liver with a slightly  nodular contour. No focal hepatic parenchymal lesions or biliary ductal dilatation identified. Pancreas: Unremarkable. No pancreatic ductal dilatation or surrounding inflammatory changes. Spleen: Grossly enlarged, 19 cm Adrenals/Urinary Tract: Adrenal glands are unremarkable. Kidneys are normal, without renal calculi, focal lesion, or hydronephrosis. Bladder is unremarkable. Stomach/Bowel: Stomach is within normal limits. Appendix appears normal. No evidence of bowel wall thickening, distention, or inflammatory changes. Vascular/Lymphatic: No suspicious adenopathy identified. No aortic aneurysm. Reproductive: Prostate is unremarkable. Other: No abdominal wall hernia or abnormality. Small amounts of ascites noted along the pericolic gutters And in the pelvis. Musculoskeletal: No acute or significant osseous findings. IMPRESSION: 1. Hepatosplenomegaly. 2. Hepatic cirrhosis. 3. Distended gallbladder. 4. Perigastric and perisplenic varices. 5. Small ascites. Electronically Signed   By: Sammie Bench M.D.   On: 05/11/2022 12:05    EKG: Independently reviewed.  EKG sinus rhythm.  Prolonged QT interval  Assessment/Plan Principal Problem:   Abdominal distention, non-gaseous Active Problems:   Alcoholic cirrhosis of liver with ascites (HCC)   Alcohol dependence with withdrawal (HCC)   Hypokalemia   GERD (gastroesophageal reflux disease)   Obesity (BMI 30-39.9)   Elevated bilirubin   Abdominal distention: Suspected spontaneous bacterial peritonitis: Patient presented with abdominal pain, subjective fever, abdominal distention. Likely spontaneous bacterial peritonitis. Empirically started on ceftriaxone 2 g IV daily and Flagyl 500 mg every 12 hours. IR consulted for ultrasound-guided paracentesis. Follow-up labs.  De-escalate antibiotics as needed. General surgery is consulted. Gastroenterology is consulted awaiting recommendation.  EtOH abuse with withdrawal: Patient has been drinking alcohol  regularly heavily. Patient has tremulousness.  He is fully awake and oriented following commands. Continue CIWA protocol. Continue to monitor clinical status  Hypokalemia: Replaced.  Continue to monitor  Hyperbilirubinemia: Likely secondary to EtOH abuse. Continue to monitor.  LFTs are mildly elevated. Follow-up abdominal ultrasound.  Essential hypertension: Continue amlodipine 5 mg daily Continue Lasix 40 mg daily Continue spironolactone 25 mg daily  Obesity: Diet and exercise discussed in detail.  Prolonged QT interval: Could be secondary to hypokalemia. Hold medication which increases QT interval. Repeat EKG in the morning  DVT prophylaxis: Lovenox Code Status: Full code Family Communication: No family at bed side. Disposition :  Admitted for EtOH abuse with withdrawal and abdominal distention suspected of SBP started on IV antibiotic IR is consulted for paracentesis.   Consults called: GI, general Surgery Admission status: Inpatient   Duard Brady MD [facility]  If 7PM-7AM, please contact night-coverage   05/11/2022, 3:39 PM

## 2022-05-12 DIAGNOSIS — R1084 Generalized abdominal pain: Principal | ICD-10-CM

## 2022-05-12 DIAGNOSIS — K7030 Alcoholic cirrhosis of liver without ascites: Secondary | ICD-10-CM

## 2022-05-12 LAB — COMPREHENSIVE METABOLIC PANEL
ALT: 22 U/L (ref 0–44)
AST: 154 U/L — ABNORMAL HIGH (ref 15–41)
Albumin: 1.8 g/dL — ABNORMAL LOW (ref 3.5–5.0)
Alkaline Phosphatase: 88 U/L (ref 38–126)
Anion gap: 9 (ref 5–15)
BUN: 7 mg/dL (ref 6–20)
CO2: 30 mmol/L (ref 22–32)
Calcium: 7.4 mg/dL — ABNORMAL LOW (ref 8.9–10.3)
Chloride: 90 mmol/L — ABNORMAL LOW (ref 98–111)
Creatinine, Ser: 0.35 mg/dL — ABNORMAL LOW (ref 0.61–1.24)
GFR, Estimated: >60 mL/min (ref >60)
Glucose, Bld: 96 mg/dL (ref 70–99)
Potassium: 3.1 mmol/L — ABNORMAL LOW (ref 3.5–5.1)
Sodium: 129 mmol/L — ABNORMAL LOW (ref 135–145)
Total Bilirubin: 26.4 mg/dL (ref 0.3–1.2)
Total Protein: 8.2 g/dL — ABNORMAL HIGH (ref 6.5–8.1)

## 2022-05-12 LAB — PROTIME-INR
INR: 2.7 — ABNORMAL HIGH (ref 0.8–1.2)
Prothrombin Time: 28.4 seconds — ABNORMAL HIGH (ref 11.4–15.2)

## 2022-05-12 LAB — PHOSPHORUS: Phosphorus: 4.3 mg/dL (ref 2.5–4.6)

## 2022-05-12 LAB — CBC
HCT: 27.2 % — ABNORMAL LOW (ref 39.0–52.0)
Hemoglobin: 9.5 g/dL — ABNORMAL LOW (ref 13.0–17.0)
MCH: 37.1 pg — ABNORMAL HIGH (ref 26.0–34.0)
MCHC: 34.9 g/dL (ref 30.0–36.0)
MCV: 106.3 fL — ABNORMAL HIGH (ref 80.0–100.0)
Platelets: 161 10*3/uL (ref 150–400)
RBC: 2.56 MIL/uL — ABNORMAL LOW (ref 4.22–5.81)
RDW: 21.6 % — ABNORMAL HIGH (ref 11.5–15.5)
WBC: 16.2 10*3/uL — ABNORMAL HIGH (ref 4.0–10.5)
nRBC: 0.1 % (ref 0.0–0.2)

## 2022-05-12 LAB — MAGNESIUM: Magnesium: 1.5 mg/dL — ABNORMAL LOW (ref 1.7–2.4)

## 2022-05-12 MED ORDER — POTASSIUM CHLORIDE 20 MEQ PO PACK
40.0000 meq | PACK | Freq: Once | ORAL | Status: AC
  Administered 2022-05-12: 40 meq via ORAL
  Filled 2022-05-12: qty 2

## 2022-05-12 MED ORDER — MAGNESIUM SULFATE 2 GM/50ML IV SOLN
2.0000 g | Freq: Once | INTRAVENOUS | Status: AC
  Administered 2022-05-12: 2 g via INTRAVENOUS
  Filled 2022-05-12: qty 50

## 2022-05-12 MED ORDER — LACTULOSE 10 GM/15ML PO SOLN
15.0000 g | Freq: Two times a day (BID) | ORAL | Status: DC
  Administered 2022-05-12 – 2022-06-02 (×43): 15 g via ORAL
  Filled 2022-05-12 (×43): qty 30

## 2022-05-12 NOTE — Progress Notes (Signed)
PROGRESS NOTE    Cameron Santiago  [id number] DOB: June 02, 1995 DOA: 05/11/2022 PCP: Pcp, No   Brief Narrative: This 27 y.o. male with PMH significant for EtOH liver cirrhosis, ongoing EtOH use, Essential hypertension, presented in the ED with abdominal distention associated with abdominal pain for last few days.  Patient drinks  ETOH regularly, 1 pint of hard liquor every day.  Patient denies any nausea,  vomiting. Patient also reports subjective fever at night with chills, He denies any GI bleeding, dizziness, weakness, urinary symptoms, denies any recent travel or  URI symptoms.  ED workup: Sodium 131, potassium 2.5, anion gap 13, magnesium 1.8, alkaline phosphatase 107, albumin 2.1, lipase 75, AST 169, ALT 21, ALT 9.4, total bilirubin 28.4, total protein 9.4, WBC 14.4, hemoglobin 10.9, hematocrit 31.2, MCV 104.7, platelet 192, influenza negative, COVID-negative, RSV negative, UA: Ketones negative, LE negative, nitrate negative. CTA/P:  Hepatosplenomegaly, Hepatic cirrhosis, Distended gallbladder, Perigastric and perisplenic varices.Small ascites.  He is admitted for further evaluation.  Assessment & Plan:   Principal Problem:   Abdominal distention, non-gaseous Active Problems:   Alcoholic cirrhosis (HCC)   Alcohol dependence with withdrawal (HCC)   Hypokalemia   GERD (gastroesophageal reflux disease)   Obesity (BMI 30-39.9)   Elevated bilirubin   Generalized abdominal pain   Abdominal distention: Suspected spontaneous bacterial peritonitis: Patient presented with abdominal pain, subjective fever, abdominal distention. Likely spontaneous bacterial peritonitis. Empirically started on ceftriaxone 2 g IV daily and Flagyl 500 mg every 12 hours. S/p ultrasound-guided paracentesis.  Minimal amount of fluid drained. Follow-up labs.  De-escalate antibiotics as needed. General surgery is consulted.  Symptoms likely from worsening liver cirrhosis possible SBP rather than  cholecystitis. He is a poor surgical candidate due to known cirrhosis and ascites. Gastroenterology is consulted, follow-up ascites labs and treat for SBP present. He will require outpatient EGD to screen for esophageal varices. Continue lactulose 15 ml every 12 hours.   EtOH abuse with withdrawal: Patient has been drinking alcohol regularly . Patient has tremulousness.  He is fully awake and oriented, following commands. Continue CIWA protocol. Continue IV thiamine to prevent Wernicke's encephalopathy Continue to monitor clinical status   Hypokalemia: Replaced.  Continue to monitor   Hyperbilirubinemia: Likely secondary to EtOH abuse. Continue to monitor.  LFTs are mildly elevated. General surgery consulted.  No signs of any obstructive etiology for hyperbilirubinemia.  Essential hypertension: Continue amlodipine 5 mg daily. Continue Lasix 40 mg daily. Continue spironolactone 25 mg daily.   Obesity: Diet and exercise discussed in detail.   Prolonged QT interval: Could be secondary to hypokalemia. Hold medication which increases QT interval. Repeat EKG improving QT interval.   DVT prophylaxis: Lovenox Code Status: Full code Family Communication: No family at bed side. Disposition Plan:    Status is: Inpatient Remains inpatient appropriate because: Admitted for abdominal distention, subjective fever likely suspected SBP.  Started on empiric antibiotics patient underwent paracentesis follow-up labs   Consultants:  General surgery IR Gastroenterology  Procedures: Ultrasound-guided paracentesis Antimicrobials: Anti-infectives (From admission, onward)    Start     Dose/Rate Route Frequency Ordered Stop   05/12/22 1100  cefTRIAXone (ROCEPHIN) 2 g in sodium chloride 0.9 % 100 mL IVPB        2 g 200 mL/hr over 30 Minutes Intravenous Every 24 hours 05/11/22 1446     05/11/22 2200  metroNIDAZOLE (FLAGYL) IVPB 500 mg  Status:  Discontinued        500 mg 100 mL/hr over  60 Minutes Intravenous Every  8 hours 05/11/22 1446 05/11/22 1454   05/11/22 2200  metroNIDAZOLE (FLAGYL) IVPB 500 mg        500 mg 100 mL/hr over 60 Minutes Intravenous Every 12 hours 05/11/22 1454     05/11/22 1500  cefTRIAXone (ROCEPHIN) 1 g in sodium chloride 0.9 % 100 mL IVPB        1 g 200 mL/hr over 30 Minutes Intravenous  Once 05/11/22 1449     05/11/22 1430  metroNIDAZOLE (FLAGYL) IVPB 500 mg        500 mg 100 mL/hr over 60 Minutes Intravenous  Once 05/11/22 1421 05/11/22 1530   05/11/22 1030  cefTRIAXone (ROCEPHIN) 1 g in sodium chloride 0.9 % 100 mL IVPB        1 g 200 mL/hr over 30 Minutes Intravenous  Once 05/11/22 1024 05/11/22 1220      Subjective: Patient was seen and examined at bedside.  Overnight events noted. Patient appears comfortable.  He underwent ultrasound-guided paracentesis minimal amount of fluid was drained. He still has significant abdominal distention associated with pain.  Objective: Vitals:   05/12/22 1100 05/12/22 1144 05/12/22 1200 05/12/22 1230  BP: (!) 129/58  128/71 127/70  Pulse: 100  91 93  Resp: (!) 21  (!) 24 (!) 22  Temp:  98.7 F (37.1 C)    TempSrc:  Oral    SpO2: 93%  95% 99%  Weight:      Height:        Intake/Output Summary (Last 24 hours) at 05/12/2022 1304 Last data filed at 05/12/2022 1210 Gross per 24 hour  Intake 197.99 ml  Output 1800 ml  Net -1602.01 ml   Filed Weights   05/11/22 0935  Weight: 122.9 kg    Examination:  General exam: Appears calm and comfortable, not in any acute distress. Respiratory system: Clear to auscultation. Respiratory effort normal.  RR 16. Cardiovascular system: S1 & S2 heard, regular rate and rhythm, no murmur. Gastrointestinal system: Abdomen is soft, distended, mildly tender, BS+ Central nervous system: Alert and oriented x 2. No focal neurological deficits. Extremities: No edema, no cyanosis, no clubbing Skin: No rashes, lesions or ulcers Psychiatry: Judgement and insight appear  normal. Mood & affect appropriate.     Data Reviewed: I have personally reviewed following labs and imaging studies  CBC: Recent Labs  Lab 05/11/22 0939 05/11/22 1525 05/12/22 0530  WBC 14.4* 13.5* 16.2*  HGB 10.9* 9.8* 9.5*  HCT 31.2* 27.9* 27.2*  MCV 104.7* 104.5* 106.3*  PLT 192 173 [id number]   Basic Metabolic Panel: Recent Labs  Lab 05/11/22 0939 05/11/22 1130 05/11/22 1525 05/12/22 0530  NA 131*  --   --  129*  K 2.5*  --   --  3.1*  CL 90*  --   --  90*  CO2 28  --   --  30  GLUCOSE 111*  --   --  96  BUN <5*  --   --  7  CREATININE 0.33*  --  <0.30* 0.35*  CALCIUM 7.9*  --   --  7.4*  MG  --  1.8  --  1.5*  PHOS  --   --   --  4.3   GFR: Estimated Creatinine Clearance: 188.3 mL/min (A) (by C-G formula based on SCr of 0.35 mg/dL (L)). Liver Function Tests: Recent Labs  Lab 05/11/22 0939 05/12/22 0530  AST 169* 154*  ALT 21 22  ALKPHOS 107 88  BILITOT 28.4* 26.4*  PROT 9.4*  8.2*  ALBUMIN 2.1* 1.8*   Recent Labs  Lab 05/11/22 0939  LIPASE 75*   No results for input(s): "AMMONIA" in the last 168 hours. Coagulation Profile: Recent Labs  Lab 05/12/22 1155  INR 2.7*   Cardiac Enzymes: No results for input(s): "CKTOTAL", "CKMB", "CKMBINDEX", "TROPONINI" in the last 168 hours. BNP (last 3 results) No results for input(s): "PROBNP" in the last 8760 hours. HbA1C: No results for input(s): "HGBA1C" in the last 72 hours. CBG: No results for input(s): "GLUCAP" in the last 168 hours. Lipid Profile: No results for input(s): "CHOL", "HDL", "LDLCALC", "TRIG", "CHOLHDL", "LDLDIRECT" in the last 72 hours. Thyroid Function Tests: No results for input(s): "TSH", "T4TOTAL", "FREET4", "T3FREE", "THYROIDAB" in the last 72 hours. Anemia Panel: No results for input(s): "VITAMINB12", "FOLATE", "FERRITIN", "TIBC", "IRON", "RETICCTPCT" in the last 72 hours. Sepsis Labs: No results for input(s): "PROCALCITON", "LATICACIDVEN" in the last 168 hours.  No results found for  this or any previous visit (from the past 240 hour(s)).   Radiology Studies: US ABDOMEN LIMITED RUQ (LIVER/GB)  Result Date: 05/11/2022 CLINICAL DATA:  Abdominal pain EXAM: ULTRASOUND ABDOMEN LIMITED RIGHT UPPER QUADRANT COMPARISON:  No prior right upper quadrant ultrasound available, correlation is made with 05/11/2022 CT abdomen pelvis FINDINGS: Gallbladder: Level sludge is noted within the gallbladder. No gallstones. The gallbladder is somewhat distended, with the wall measuring up to 4 mm, just above the upper limit of normal. No pericholecystic fluid. No sonographic Murphy sign noted by sonographer. Common bile duct: Diameter: 6 mm, within normal limits. No intrahepatic biliary ductal dilatation. Liver: No focal lesion identified. Increased parenchymal echogenicity, which limits sonographic penetration. Portal vein is patent on color Doppler imaging with likely reversal of blood flow, away from the liver. Other: None. IMPRESSION: 1. Gallbladder sludge and mild wall thickening, just above upper limit of normal. No pericholecystic fluid or sonographic Murphy sign. Findings are equivocal for acute cholecystitis and may be related to the patient's history of cirrhosis. 2. Portal vein is patent with likely reversal of blood flow. Electronically Signed   By: Merilyn Baba M.D.   On: 05/11/2022 14:17   CT Abdomen Pelvis W Contrast  Result Date: 05/11/2022 CLINICAL DATA:  Abdominal pain EXAM: CT ABDOMEN AND PELVIS WITH CONTRAST TECHNIQUE: Multidetector CT imaging of the abdomen and pelvis was performed using the standard protocol following bolus administration of intravenous contrast. RADIATION DOSE REDUCTION: This exam was performed according to the departmental dose-optimization program which includes automated exposure control, adjustment of the mA and/or kV according to patient size and/or use of iterative reconstruction technique. CONTRAST:  100 mL OMNIPAQUE IOHEXOL 300 MG/ML  SOLN COMPARISON:  03/12/2022  FINDINGS: Lower chest: Bibasilar linear subsegmental atelectasis. No pleural or pericardial effusion identified. Perigastric and perisplenic varices. Hepatobiliary: Distended gallbladder. Enlarged liver with a slightly nodular contour. No focal hepatic parenchymal lesions or biliary ductal dilatation identified. Pancreas: Unremarkable. No pancreatic ductal dilatation or surrounding inflammatory changes. Spleen: Grossly enlarged, 19 cm Adrenals/Urinary Tract: Adrenal glands are unremarkable. Kidneys are normal, without renal calculi, focal lesion, or hydronephrosis. Bladder is unremarkable. Stomach/Bowel: Stomach is within normal limits. Appendix appears normal. No evidence of bowel wall thickening, distention, or inflammatory changes. Vascular/Lymphatic: No suspicious adenopathy identified. No aortic aneurysm. Reproductive: Prostate is unremarkable. Other: No abdominal wall hernia or abnormality. Small amounts of ascites noted along the pericolic gutters And in the pelvis. Musculoskeletal: No acute or significant osseous findings. IMPRESSION: 1. Hepatosplenomegaly. 2. Hepatic cirrhosis. 3. Distended gallbladder. 4. Perigastric and perisplenic varices. 5. Small  ascites. Electronically Signed   By: Sammie Bench M.D.   On: 05/11/2022 12:05    Scheduled Meds:  amLODipine  5 mg Oral Daily   docusate sodium  100 mg Oral BID   enoxaparin (LOVENOX) injection  0.5 mg/kg Subcutaneous [id number]   folic acid  1 mg Oral Daily   furosemide  40 mg Oral Daily   multivitamin with minerals  1 tablet Oral Daily   senna  1 tablet Oral BID   spironolactone  100 mg Oral Daily   thiamine  100 mg Oral Daily   Or   thiamine  100 mg Intravenous Daily   Continuous Infusions:  cefTRIAXone (ROCEPHIN)  IV     cefTRIAXone (ROCEPHIN)  IV Stopped (05/12/22 1210)   metronidazole Stopped (05/12/22 1142)     LOS: 1 day    Time spent: 35 mins    Duard Brady, MD [facility]   If 7PM-7AM, please contact  night-coverage

## 2022-05-12 NOTE — ED Notes (Signed)
Received report from Gwyndolyn Saxon, [place]. Pt to room 33. Pt on cardiac monitoring and oxygen via [place] at 2lpm. Pt sleeping at this time.

## 2022-05-12 NOTE — ED Notes (Signed)
Pt sleeping. NAD noted.

## 2022-05-12 NOTE — Consult Note (Addendum)
[name] , MD [address] [place], [place], [place], [address] [address], [place], [place], [place], [address] Phone: [phone]  Fax: [phone]  Consultation  Referring Provider:   Dr Shelly Coss Primary Care Physician:  Pcp, No Primary Gastroenterologist: None    Reason for Consultation:     SBP  Date of Admission:  05/11/2022 Date of Consultation:  05/12/2022         HPI:   Cameron Santiago is a 27 y.o. male whom I have been consulted to evaluate for SBP.  He carries a diagnosis of alcoholic liver cirrhosis with ongoing alcohol use present to the emergency room with abdominal distention and pain for few days.  I was called to see him for SBP the patient had not had any paracentesis at the time of consultation and I recommended to obtain paracentesis before we make recommendations for SBP.   05/11/2022 CT abdomen and pelvis with contrast demonstrated hepatosplenomegaly, cirrhosis distended gallbladder perigastric and perisplenic varices and small ascites.  05/11/2022 ultrasound right upper quadrant showed gallbladder sludge with minimal wall thickening no pericholecystic fluid or sonographic Percell Miller sign findings are equivocal for acute cholecystitis.  Portal vein is patent.   03/12/2022 CT abdomen pelvis with contrast shows cirrhosis of the liver splenomegaly.  12/27/2021 MRI of the liver shows features suggestive of [place] cirrhosis.  Hemoglobin was 13 g 4 months back presently is 9.5 g with an MCV of 106.  Magnesium low at 1.5 creatinine 0.35.  Fluid was collected for paracentesis results elevated.   Patient was seen by general surgery and not felt to have any surgical issues at this point of time.   Patient states that he has had abdominal pain for a few weeks if not longer complains of pain all over his abdomen has not had a good bowel meant today but normally does have.  Denies any hematemesis or melena actively consumes alcohol last drink was 1 day before the  admission.  Not evaluated for his liver disease awaiting Medicaid.  No other complaints. Past Medical History:  Diagnosis Date   Alcohol abuse    Alcoholic pancreatitis    GERD (gastroesophageal reflux disease)     Past Surgical History:  Procedure Laterality Date   APPENDECTOMY     LAPAROSCOPIC APPENDECTOMY N/A 02/15/2015   Procedure: APPENDECTOMY LAPAROSCOPIC;  Surgeon: Sherri Rad, MD;  Location: [facility] ORS;  Service: General;  Laterality: N/A;   TONSILLECTOMY      Prior to Admission medications   Medication Sig Start Date End Date Taking? Authorizing Provider  albuterol (VENTOLIN HFA) 108 (90 Base) MCG/ACT inhaler Inhale 2 puffs into the lungs every 6 (six) hours as needed for wheezing or shortness of breath. Patient not taking: Reported on 05/11/2022 11/22/19   Marlana Salvage, PA  amLODipine (NORVASC) 5 MG tablet Take 1 tablet (5 mg total) by mouth daily. 07/17/21 08/16/21  Antonieta Pert, MD  folic acid (FOLVITE) 1 MG tablet Take 1 tablet (1 mg total) by mouth daily. Patient not taking: Reported on 05/11/2022 01/01/22   Wouk, Ailene Rud, MD  furosemide (LASIX) 40 MG tablet Take 1 tablet (40 mg total) by mouth daily. Patient not taking: Reported on 05/11/2022 01/01/22   Wouk, Ailene Rud, MD  Multiple Vitamin (MULTIVITAMIN WITH MINERALS) TABS tablet Take 1 tablet by mouth daily. Patient not taking: Reported on 05/11/2022 01/01/22   Wouk, Ailene Rud, MD  spironolactone (ALDACTONE) 100 MG tablet Take 1 tablet (100 mg total) by mouth daily.  Patient not taking: Reported on 05/11/2022 01/01/22   Wouk, Ailene Rud, MD    Family History  Problem Relation Age of Onset   Arthritis Mother      Social History   Tobacco Use   Smoking status: Never   Smokeless tobacco: Never  Substance Use Topics   Alcohol use: Yes    Comment: occassional   Drug use: Never    Allergies as of 05/11/2022   (No Known Allergies)    Review of Systems:    All systems reviewed and negative except where  noted in HPI.   Physical Exam:  Vital signs in last 24 hours: Temp:  [98.1 F (36.7 C)-100.2 F (37.9 C)] 98.9 F (37.2 C) (03/19 0541) Pulse Rate:  [90-195] 95 (03/19 0716) Resp:  [18-33] 18 (03/19 0716) BP: (121-168)/(57-89) 124/73 (03/19 0716) SpO2:  [93 %-100 %] 98 % (03/19 0716) Weight:  [122.9 kg] 122.9 kg (03/18 0935)   General: Drowsy but arousable easily and able to have a conversation tremors on extended hands noted Head:  Normocephalic and atraumatic. Eyes:   No icterus.   Conjunctiva pink. PERRLA. Ears:  Normal auditory acuity. Neck:  Supple; no masses or thyroidomegaly Lungs: Respirations even and unlabored. Lungs clear to auscultation bilaterally.   No wheezes, crackles, or rhonchi.  Heart:  Regular rate and rhythm;  Without murmur, clicks, rubs or gallops Abdomen:  Soft, nondistended, mild tenderness in right lower quadrant normal bowel sounds. No appreciable masses or hepatomegaly.  No rebound or guarding.  Neurologic:  Alert and oriented x3;  grossly normal neurologically. Skin:  Intact without significant lesions or rashes. Cervical Nodes:  No significant cervical adenopathy. Psych:  Alert and cooperative. Normal affect.  LAB RESULTS: Recent Labs    05/11/22 0939 05/11/22 1525 05/12/22 0530  WBC 14.4* 13.5* 16.2*  HGB 10.9* 9.8* 9.5*  HCT 31.2* 27.9* 27.2*  PLT 192 173 161   BMET Recent Labs    05/11/22 0939 05/11/22 1525 05/12/22 0530  NA 131*  --  129*  K 2.5*  --  3.1*  CL 90*  --  90*  CO2 28  --  30  GLUCOSE 111*  --  96  BUN <5*  --  7  CREATININE 0.33* <0.30* 0.35*  CALCIUM 7.9*  --  7.4*   LFT Recent Labs    05/11/22 1525 05/12/22 0530  PROT  --  8.2*  ALBUMIN  --  1.8*  AST  --  154*  ALT  --  22  ALKPHOS  --  88  BILITOT  --  26.4*  BILIDIR 13.4*  --    PT/INR No results for input(s): "LABPROT", "INR" in the last 72 hours.  STUDIES: US ABDOMEN LIMITED RUQ (LIVER/GB)  Result Date: 05/11/2022 CLINICAL DATA:  Abdominal  pain EXAM: ULTRASOUND ABDOMEN LIMITED RIGHT UPPER QUADRANT COMPARISON:  No prior right upper quadrant ultrasound available, correlation is made with 05/11/2022 CT abdomen pelvis FINDINGS: Gallbladder: Level sludge is noted within the gallbladder. No gallstones. The gallbladder is somewhat distended, with the wall measuring up to 4 mm, just above the upper limit of normal. No pericholecystic fluid. No sonographic Murphy sign noted by sonographer. Common bile duct: Diameter: 6 mm, within normal limits. No intrahepatic biliary ductal dilatation. Liver: No focal lesion identified. Increased parenchymal echogenicity, which limits sonographic penetration. Portal vein is patent on color Doppler imaging with likely reversal of blood flow, away from the liver. Other: None. IMPRESSION: 1. Gallbladder sludge and mild wall thickening, just  above upper limit of normal. No pericholecystic fluid or sonographic Murphy sign. Findings are equivocal for acute cholecystitis and may be related to the patient's history of cirrhosis. 2. Portal vein is patent with likely reversal of blood flow. Electronically Signed   By: Merilyn Baba M.D.   On: 05/11/2022 14:17   CT Abdomen Pelvis W Contrast  Result Date: 05/11/2022 CLINICAL DATA:  Abdominal pain EXAM: CT ABDOMEN AND PELVIS WITH CONTRAST TECHNIQUE: Multidetector CT imaging of the abdomen and pelvis was performed using the standard protocol following bolus administration of intravenous contrast. RADIATION DOSE REDUCTION: This exam was performed according to the departmental dose-optimization program which includes automated exposure control, adjustment of the mA and/or kV according to patient size and/or use of iterative reconstruction technique. CONTRAST:  100 mL OMNIPAQUE IOHEXOL 300 MG/ML  SOLN COMPARISON:  03/12/2022 FINDINGS: Lower chest: Bibasilar linear subsegmental atelectasis. No pleural or pericardial effusion identified. Perigastric and perisplenic varices. Hepatobiliary:  Distended gallbladder. Enlarged liver with a slightly nodular contour. No focal hepatic parenchymal lesions or biliary ductal dilatation identified. Pancreas: Unremarkable. No pancreatic ductal dilatation or surrounding inflammatory changes. Spleen: Grossly enlarged, 19 cm Adrenals/Urinary Tract: Adrenal glands are unremarkable. Kidneys are normal, without renal calculi, focal lesion, or hydronephrosis. Bladder is unremarkable. Stomach/Bowel: Stomach is within normal limits. Appendix appears normal. No evidence of bowel wall thickening, distention, or inflammatory changes. Vascular/Lymphatic: No suspicious adenopathy identified. No aortic aneurysm. Reproductive: Prostate is unremarkable. Other: No abdominal wall hernia or abnormality. Small amounts of ascites noted along the pericolic gutters And in the pelvis. Musculoskeletal: No acute or significant osseous findings. IMPRESSION: 1. Hepatosplenomegaly. 2. Hepatic cirrhosis. 3. Distended gallbladder. 4. Perigastric and perisplenic varices. 5. Small ascites. Electronically Signed   By: Sammie Bench M.D.   On: 05/11/2022 12:05      Impression / Plan:   Cameron Santiago is a 27 y.o. 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  Presents to the emergency room with abdominal distention and pain I was consulted for evaluation SBP, acetic fluid has been taken last evening but results are not yet available.  The patient has been seen by general surgery and felt to not have any acute surgical issues at this point of time.  Already started on antibiotics for SBP   Plan 1.  Follow-up on ascetic fluid to evaluate for SBP and if present continue antibiotics for 5 days and reassess for clinical improvement 2.  IV thiamine to prevent Wernicke's encephalopathy 3.  Stop all alcohol use  advised associated with very high mortality 4.  As an outpatient will require an EGD to screen for esophageal versus and full autoimmune and viral hepatitis workup to determine why he has cirrhosis at the age of 13 which could obviously be from alcohol but other conditions such as Wilson's disease to be ruled out 5.  Will require right upper quadrant ultrasound every 6 months to screen for [facility] and outpatient referral for liver transplant evaluation 6.  Serial abdominal exam and if it changes obtain CT scan of the abdomen probably better to obtain a CT angiogram to rule out mesenteric ischemia. 7.  Treat for any alcohol withdrawal by placing on protocol 8.  No INR checked since admission I will order it this will help [place] to calculate his [place] disc of infarction that he  may also have alcoholic hepatitis Thank you for involving me in the care of this patient.      LOS: 1 day   [name], MD  05/12/2022, 8:00 AM

## 2022-05-12 NOTE — ED Notes (Signed)
Patient transitioned to hospital bed to promote comfort. VS updated and morning labs collected. Cardiac monitor remains in place. Pt alert and oriented. Bed low and locked with side rails raised. Call bell in reach.

## 2022-05-12 NOTE — ED Notes (Signed)
Abd distended, soft. Paracentesis site dry, intact without s/s of infection. Pt reports abd pain 8/10. See MAR. Bilateral lower extremity swelling, pulses +2 bilat.

## 2022-05-12 NOTE — Progress Notes (Signed)
       CROSS COVER NOTE  NAME: LAYN PLETT MRN: [id number] DOB : [birth year] ATTENDING PHYSICIAN: Duard Brady, MD    Date of Service   05/12/2022   HPI/Events of Note   Message received from RN requesting clarification of diet order. Diet order is NPO and patient has PO meds ordered. On review of MAR patient has been receiving PO meds for the duration of the time NPO order has been in place since yesterday at 1444.   Interventions   Assessment/Plan: Diet order updated to NPO Sips with Meds      To reach the provider On-Call:   7AM- 7PM see care teams to locate the attending and reach out to them via [email or web address] Password: [email or web address] 7PM-7AM contact night-coverage If you still have difficulty reaching the appropriate provider, please page the [facility] (Director on Call) for [facility] on amion for assistance  This document was prepared using Systems analyst and may include unintentional dictation errors.  Neomia Glass DNP, MBA, FNP-BC, PMHNP-BC Nurse Practitioner [facility] Pager [phone]

## 2022-05-12 NOTE — ED Notes (Signed)
Per orders found in Signed and Held, RN contacted [facility] Radiology due to pt tachycardia. Message left with nurse on call line at provided number. RN awaiting response.

## 2022-05-13 DIAGNOSIS — R1084 Generalized abdominal pain: Secondary | ICD-10-CM

## 2022-05-13 DIAGNOSIS — K7011 Alcoholic hepatitis with ascites: Secondary | ICD-10-CM

## 2022-05-13 DIAGNOSIS — R17 Unspecified jaundice: Secondary | ICD-10-CM

## 2022-05-13 DIAGNOSIS — K219 Gastro-esophageal reflux disease without esophagitis: Secondary | ICD-10-CM

## 2022-05-13 DIAGNOSIS — F10230 Alcohol dependence with withdrawal, uncomplicated: Secondary | ICD-10-CM

## 2022-05-13 DIAGNOSIS — E669 Obesity, unspecified: Secondary | ICD-10-CM

## 2022-05-13 DIAGNOSIS — E876 Hypokalemia: Secondary | ICD-10-CM

## 2022-05-13 DIAGNOSIS — K7030 Alcoholic cirrhosis of liver without ascites: Secondary | ICD-10-CM

## 2022-05-13 LAB — COMPREHENSIVE METABOLIC PANEL
ALT: 21 U/L (ref 0–44)
AST: 123 U/L — ABNORMAL HIGH (ref 15–41)
Albumin: 1.8 g/dL — ABNORMAL LOW (ref 3.5–5.0)
Alkaline Phosphatase: 82 U/L (ref 38–126)
Anion gap: 10 (ref 5–15)
BUN: 13 mg/dL (ref 6–20)
CO2: 28 mmol/L (ref 22–32)
Calcium: 7.3 mg/dL — ABNORMAL LOW (ref 8.9–10.3)
Chloride: 90 mmol/L — ABNORMAL LOW (ref 98–111)
Creatinine, Ser: 0.64 mg/dL (ref 0.61–1.24)
GFR, Estimated: >60 mL/min (ref >60)
Glucose, Bld: 95 mg/dL (ref 70–99)
Potassium: 2.6 mmol/L — CL (ref 3.5–5.1)
Sodium: 128 mmol/L — ABNORMAL LOW (ref 135–145)
Total Bilirubin: 26.7 mg/dL (ref 0.3–1.2)
Total Protein: 8.2 g/dL — ABNORMAL HIGH (ref 6.5–8.1)

## 2022-05-13 LAB — CBC
HCT: 27.5 % — ABNORMAL LOW (ref 39.0–52.0)
Hemoglobin: 9.7 g/dL — ABNORMAL LOW (ref 13.0–17.0)
MCH: 36.9 pg — ABNORMAL HIGH (ref 26.0–34.0)
MCHC: 35.3 g/dL (ref 30.0–36.0)
MCV: 104.6 fL — ABNORMAL HIGH (ref 80.0–100.0)
Platelets: 163 10*3/uL (ref 150–400)
RBC: 2.63 MIL/uL — ABNORMAL LOW (ref 4.22–5.81)
RDW: 20.4 % — ABNORMAL HIGH (ref 11.5–15.5)
WBC: 15.7 10*3/uL — ABNORMAL HIGH (ref 4.0–10.5)
nRBC: 0.3 % — ABNORMAL HIGH (ref 0.0–0.2)

## 2022-05-13 LAB — PHOSPHORUS: Phosphorus: 3.7 mg/dL (ref 2.5–4.6)

## 2022-05-13 LAB — MAGNESIUM: Magnesium: 1.6 mg/dL — ABNORMAL LOW (ref 1.7–2.4)

## 2022-05-13 LAB — POTASSIUM: Potassium: 3.2 mmol/L — ABNORMAL LOW (ref 3.5–5.1)

## 2022-05-13 LAB — CYTOLOGY - NON PAP

## 2022-05-13 MED ORDER — POTASSIUM CHLORIDE 10 MEQ/100ML IV SOLN
10.0000 meq | INTRAVENOUS | Status: AC
  Administered 2022-05-13 (×4): 10 meq via INTRAVENOUS
  Filled 2022-05-13 (×4): qty 100

## 2022-05-13 MED ORDER — ALBUMIN HUMAN 25 % IV SOLN
12.5000 g | Freq: Once | INTRAVENOUS | Status: AC
  Administered 2022-05-13: 12.5 g via INTRAVENOUS
  Filled 2022-05-13: qty 50

## 2022-05-13 MED ORDER — POTASSIUM CHLORIDE CRYS ER 20 MEQ PO TBCR
40.0000 meq | EXTENDED_RELEASE_TABLET | Freq: Once | ORAL | Status: AC
  Administered 2022-05-13: 40 meq via ORAL
  Filled 2022-05-13: qty 2

## 2022-05-13 NOTE — Progress Notes (Signed)
Oncoming RN and MD Sheppard Coil notified of pt's critical potassium level of 3.1 at 06:59am from lab by Probation officer.

## 2022-05-13 NOTE — Progress Notes (Signed)
PROGRESS NOTE    Cameron Santiago   [id number] DOB: January 06, 1996  DOA: 05/11/2022 Date of Service: 05/13/22 PCP: Merryl Hacker, No     Brief Narrative / Hospital Course:  This 27 y.o. male with PMH significant for EtOH liver cirrhosis, ongoing EtOH use, Essential hypertension, presented in the ED with abdominal distention associated with abdominal pain for last few days.  Patient drinks  ETOH regularly, 1 pint of hard liquor every day.  Patient denies any nausea,  vomiting. Patient also reports subjective fever at night with chills, He denies any GI bleeding, dizziness, weakness, urinary symptoms, denies any recent travel or  URI symptoms.  03/18: ED workup: Sodium 131, potassium 2.5, anion gap 13, magnesium 1.8, alkaline phosphatase 107, albumin 2.1, lipase 75, AST 169, ALT 21, ALT 9.4, total bilirubin 28.4, total protein 9.4, WBC 14.4, hemoglobin 10.9, hematocrit 31.2, MCV 104.7, platelet 192, influenza negative, COVID-negative, RSV negative, UA: Ketones negative, LE negative, nitrate negative. CTA/P:  Hepatosplenomegaly, Hepatic cirrhosis, Distended gallbladder, Perigastric and perisplenic varices.Small ascites. Admitted for treatment and monitoring EtOH withdrawal  03/19:  GI saw patient. NPO 03/20: Advance to [facility]. Pt is concerned about EtOH w/drawal, remains on CIWA protocol   Consultants:  General surgery  Gastroenterology   Procedures: 05/11/22 Paracentesis       ASSESSMENT & PLAN:   Principal Problem:   Ascites d/e alcoholic hepatitis/cirrhosis vs other or comorbid hepatic abnormality such as WIlson's Active Problems:   Alcoholic cirrhosis ([facility])   Alcohol dependence with withdrawal (HCC)   Hypokalemia   GERD (gastroesophageal reflux disease)   Obesity (BMI 30-39.9)   Elevated bilirubin   Generalized abdominal pain   Ascites and abdominal distension d/t alcoholic hepatitis/cirrhosis vs other or comorbid hepatic abnormality such as WIlson's Alcoholic cirrhosis Concern for  SBP Follow-up on ascites fluid analysis to evaluate for SBP - if present continue antibiotics for 5 days and reassess for clinical improvement. There was minimal fluid collected so analysis may be incomplete - continue empiric SBP tx  Serial abdominal exam - of change/worse obtain CT abdomen or preferably CT angiogram to rule out mesenteric ischemia  Abstain EtOH As an outpatient will require an EGD to screen for esophageal versus and full autoimmune and viral hepatitis workup to determine why he has cirrhosis at the age of 6 which could obviously be from alcohol but other conditions such as Wilson's disease to be ruled out With concern for SBP , steroids for alcoholic hepatitis is contraindicated  Serum creatinine has doubled - albumin infusion ordered Correct electrolytes   Alcohol dependence with withdrawal  IV thiamine to prevent Wernicke's encephalopathy CIWA protocol   Hypokalemia Replace as needed Monitor BMP    DVT prophylaxis: SCD Pertinent IV fluids/nutrition: no continuous IF fluids  Central lines / invasive devices: none   Code Status: FULL CODE  Current Admission Status: inpatient  TOC needs / Dispo plan: none at this time Barriers to discharge / significant pending items: clinical improvement              Subjective / Brief ROS:  Patient reports abdominal pain Denies CP/SOB.  Denies new weakness.  Requesting diet or at least able to drink Reports no concerns w/ urination/defecation.   Family Communication: support person at bedside on rounds     Objective Findings:  Vitals:   05/12/22 2246 05/13/22 0359 05/13/22 0852 05/13/22 1241  BP: (!) 142/70 (!) 112/48 122/64 128/69  Pulse: 97 92 99 95  Resp: 18 20 18 18   Temp: 99.3  F (37.4 C) 98 F (36.7 C) 98.3 F (36.8 C) 98.3 F (36.8 C)  TempSrc: Oral  Oral Oral  SpO2: 100% 97% 97% 95%  Weight:      Height:        Intake/Output Summary (Last 24 hours) at 05/13/2022 1542 Last data filed at  05/13/2022 1530 Gross per 24 hour  Intake 240 ml  Output --  Net 240 ml   Filed Weights   05/11/22 0935  Weight: 122.9 kg    Examination:  Physical Exam Constitutional:      General: He is not in acute distress.    Appearance: He is obese. He is ill-appearing. He is not toxic-appearing.  Eyes:     General: Scleral icterus present.     Extraocular Movements: Extraocular movements intact.  Cardiovascular:     Rate and Rhythm: Normal rate and regular rhythm.  Pulmonary:     Effort: Pulmonary effort is normal.     Breath sounds: Normal breath sounds.  Abdominal:     General: Abdomen is protuberant. Bowel sounds are normal. There is no distension.     Palpations: Abdomen is soft.     Tenderness: There is abdominal tenderness in the epigastric area.  Skin:    General: Skin is warm and dry.     Coloration: Skin is jaundiced.  Neurological:     General: No focal deficit present.     Mental Status: He is alert.  Psychiatric:        Behavior: Behavior normal.          Scheduled Medications:   amLODipine  5 mg Oral Daily   docusate sodium  100 mg Oral BID   enoxaparin (LOVENOX) injection  0.5 mg/kg Subcutaneous [id number]   folic acid  1 mg Oral Daily   furosemide  40 mg Oral Daily   lactulose  15 g Oral BID   multivitamin with minerals  1 tablet Oral Daily   senna  1 tablet Oral BID   spironolactone  100 mg Oral Daily   thiamine  100 mg Oral Daily   Or   thiamine  100 mg Intravenous Daily    Continuous Infusions:  cefTRIAXone (ROCEPHIN)  IV 2 g (05/13/22 1137)   metronidazole Stopped (05/13/22 1138)    PRN Medications:  acetaminophen **OR** acetaminophen, LORazepam, ondansetron **OR** ondansetron (ZOFRAN) IV, oxyCODONE  Antimicrobials from admission:  Anti-infectives (From admission, onward)    Start     Dose/Rate Route Frequency Ordered Stop   05/12/22 1100  cefTRIAXone (ROCEPHIN) 2 g in sodium chloride 0.9 % 100 mL IVPB        2 g 200 mL/hr over 30 Minutes  Intravenous Every 24 hours 05/11/22 1446     05/11/22 2200  metroNIDAZOLE (FLAGYL) IVPB 500 mg  Status:  Discontinued        500 mg 100 mL/hr over 60 Minutes Intravenous Every 8 hours 05/11/22 1446 05/11/22 1454   05/11/22 2200  metroNIDAZOLE (FLAGYL) IVPB 500 mg        500 mg 100 mL/hr over 60 Minutes Intravenous Every 12 hours 05/11/22 1454     05/11/22 1500  cefTRIAXone (ROCEPHIN) 1 g in sodium chloride 0.9 % 100 mL IVPB  Status:  Discontinued        1 g 200 mL/hr over 30 Minutes Intravenous  Once 05/11/22 1449 05/12/22 1607   05/11/22 1430  metroNIDAZOLE (FLAGYL) IVPB 500 mg        500 mg 100 mL/hr over 60  Minutes Intravenous  Once 05/11/22 1421 05/11/22 1530   05/11/22 1030  cefTRIAXone (ROCEPHIN) 1 g in sodium chloride 0.9 % 100 mL IVPB        1 g 200 mL/hr over 30 Minutes Intravenous  Once 05/11/22 1024 05/11/22 1220           Data Reviewed:  I have personally reviewed the following...  CBC: Recent Labs  Lab 05/11/22 0939 05/11/22 1525 05/12/22 0530 05/13/22 0543  WBC 14.4* 13.5* 16.2* 15.7*  HGB 10.9* 9.8* 9.5* 9.7*  HCT 31.2* 27.9* 27.2* 27.5*  MCV 104.7* 104.5* 106.3* 104.6*  PLT 192 173 161 [id number]   Basic Metabolic Panel: Recent Labs  Lab 05/11/22 0939 05/11/22 1130 05/11/22 1525 05/12/22 0530 05/13/22 0543  NA 131*  --   --  129* 128*  K 2.5*  --   --  3.1* 2.6*  CL 90*  --   --  90* 90*  CO2 28  --   --  30 28  GLUCOSE 111*  --   --  96 95  BUN <5*  --   --  7 13  CREATININE 0.33*  --  <0.30* 0.35* 0.64  CALCIUM 7.9*  --   --  7.4* 7.3*  MG  --  1.8  --  1.5* 1.6*  PHOS  --   --   --  4.3 3.7   GFR: Estimated Creatinine Clearance: 188.3 mL/min (by C-G formula based on SCr of 0.64 mg/dL). Liver Function Tests: Recent Labs  Lab 05/11/22 0939 05/12/22 0530 05/13/22 0543  AST 169* 154* 123*  ALT 21 22 21   ALKPHOS 107 88 82  BILITOT 28.4* 26.4* 26.7*  PROT 9.4* 8.2* 8.2*  ALBUMIN 2.1* 1.8* 1.8*   Recent Labs  Lab 05/11/22 0939  LIPASE  75*   No results for input(s): "AMMONIA" in the last 168 hours. Coagulation Profile: Recent Labs  Lab 05/12/22 1155  INR 2.7*   Cardiac Enzymes: No results for input(s): "CKTOTAL", "CKMB", "CKMBINDEX", "TROPONINI" in the last 168 hours. BNP (last 3 results) No results for input(s): "PROBNP" in the last 8760 hours. HbA1C: No results for input(s): "HGBA1C" in the last 72 hours. CBG: No results for input(s): "GLUCAP" in the last 168 hours. Lipid Profile: No results for input(s): "CHOL", "HDL", "LDLCALC", "TRIG", "CHOLHDL", "LDLDIRECT" in the last 72 hours. Thyroid Function Tests: No results for input(s): "TSH", "T4TOTAL", "FREET4", "T3FREE", "THYROIDAB" in the last 72 hours. Anemia Panel: No results for input(s): "VITAMINB12", "FOLATE", "FERRITIN", "TIBC", "IRON", "RETICCTPCT" in the last 72 hours. Most Recent Urinalysis On File:     Component Value Date/Time   COLORURINE AMBER (A) 05/11/2022 1447   APPEARANCEUR CLEAR (A) 05/11/2022 1447   LABSPEC 1.033 (H) 05/11/2022 1447   PHURINE 7.0 05/11/2022 1447   GLUCOSEU NEGATIVE 05/11/2022 1447   HGBUR SMALL (A) 05/11/2022 1447   BILIRUBINUR MODERATE (A) 05/11/2022 1447   KETONESUR NEGATIVE 05/11/2022 1447   PROTEINUR NEGATIVE 05/11/2022 1447   NITRITE NEGATIVE 05/11/2022 1447   LEUKOCYTESUR NEGATIVE 05/11/2022 1447   Sepsis Labs: @LABRCNTIP (procalcitonin:4,lacticidven:4) Microbiology: No results found for this or any previous visit (from the past 240 hour(s)).    Radiology Studies last 3 days: US Paracentesis  Result Date: 05/12/2022 INDICATION: Ascites, possible spontaneous bacterial peritonitis EXAM: ULTRASOUND GUIDED  PARACENTESIS MEDICATIONS: Lidocaine 1% subcutaneous COMPLICATIONS: None immediate. PROCEDURE: Informed written consent was obtained from the patient after a discussion of the risks, benefits and alternatives to treatment. A timeout was performed prior to the initiation  of the procedure. Initial ultrasound  scanning demonstrates a small amount of infrahepatic ascites within the right lower abdominal quadrant. The right lower abdomen was prepped and draped in the usual sterile fashion. 1% lidocaine was used for local anesthesia. Following this, under real-time ultrasound guidance a Safe-T-Centesis catheter was introduced. An ultrasound image was saved for documentation purposes. The paracentesis was performed. Only scant volume obtained. The catheter was removed and a dressing was applied. The patient tolerated the procedure well without immediate post procedural complication. FINDINGS: A total of less than 5 mL clear yellow fluid was removed. Samples were sent to the laboratory as requested by the clinical team. IMPRESSION: Successful ultrasound-guided paracentesis yielding less than 5 mL of peritoneal fluid. Electronically Signed   By: Lucrezia Europe M.D.   On: 05/12/2022 15:11   US ABDOMEN LIMITED RUQ (LIVER/GB)  Result Date: 05/11/2022 CLINICAL DATA:  Abdominal pain EXAM: ULTRASOUND ABDOMEN LIMITED RIGHT UPPER QUADRANT COMPARISON:  No prior right upper quadrant ultrasound available, correlation is made with 05/11/2022 CT abdomen pelvis FINDINGS: Gallbladder: Level sludge is noted within the gallbladder. No gallstones. The gallbladder is somewhat distended, with the wall measuring up to 4 mm, just above the upper limit of normal. No pericholecystic fluid. No sonographic Murphy sign noted by sonographer. Common bile duct: Diameter: 6 mm, within normal limits. No intrahepatic biliary ductal dilatation. Liver: No focal lesion identified. Increased parenchymal echogenicity, which limits sonographic penetration. Portal vein is patent on color Doppler imaging with likely reversal of blood flow, away from the liver. Other: None. IMPRESSION: 1. Gallbladder sludge and mild wall thickening, just above upper limit of normal. No pericholecystic fluid or sonographic Murphy sign. Findings are equivocal for acute cholecystitis and  may be related to the patient's history of cirrhosis. 2. Portal vein is patent with likely reversal of blood flow. Electronically Signed   By: Merilyn Baba M.D.   On: 05/11/2022 14:17   CT Abdomen Pelvis W Contrast  Result Date: 05/11/2022 CLINICAL DATA:  Abdominal pain EXAM: CT ABDOMEN AND PELVIS WITH CONTRAST TECHNIQUE: Multidetector CT imaging of the abdomen and pelvis was performed using the standard protocol following bolus administration of intravenous contrast. RADIATION DOSE REDUCTION: This exam was performed according to the departmental dose-optimization program which includes automated exposure control, adjustment of the mA and/or kV according to patient size and/or use of iterative reconstruction technique. CONTRAST:  100 mL OMNIPAQUE IOHEXOL 300 MG/ML  SOLN COMPARISON:  03/12/2022 FINDINGS: Lower chest: Bibasilar linear subsegmental atelectasis. No pleural or pericardial effusion identified. Perigastric and perisplenic varices. Hepatobiliary: Distended gallbladder. Enlarged liver with a slightly nodular contour. No focal hepatic parenchymal lesions or biliary ductal dilatation identified. Pancreas: Unremarkable. No pancreatic ductal dilatation or surrounding inflammatory changes. Spleen: Grossly enlarged, 19 cm Adrenals/Urinary Tract: Adrenal glands are unremarkable. Kidneys are normal, without renal calculi, focal lesion, or hydronephrosis. Bladder is unremarkable. Stomach/Bowel: Stomach is within normal limits. Appendix appears normal. No evidence of bowel wall thickening, distention, or inflammatory changes. Vascular/Lymphatic: No suspicious adenopathy identified. No aortic aneurysm. Reproductive: Prostate is unremarkable. Other: No abdominal wall hernia or abnormality. Small amounts of ascites noted along the pericolic gutters And in the pelvis. Musculoskeletal: No acute or significant osseous findings. IMPRESSION: 1. Hepatosplenomegaly. 2. Hepatic cirrhosis. 3. Distended gallbladder. 4.  Perigastric and perisplenic varices. 5. Small ascites. Electronically Signed   By: Sammie Bench M.D.   On: 05/11/2022 12:05             LOS: 2 days  Emeterio Reeve, DO [facility] 05/13/2022, 3:42 PM    Dictation software may have been used to generate the above note. Typos may occur and escape review in typed/dictated notes. Please contact Dr Sheppard Coil directly for clarity if needed.  Staff may message me via secure chat in [place]  but this may not receive an immediate response,  please page me for urgent matters!  If 7PM-7AM, please contact night coverage [email or web address]

## 2022-05-13 NOTE — Progress Notes (Signed)
[name] , MD [address] [place], [place], [place], [address] 3940 [address] [place], [place], [place], [address] Phone: [phone]  Fax: [phone]   Cameron Santiago is being followed for possible alocholic hepatitis  Day 1 of follow up   Subjective: Has had 2 bowel movements today , still complains of generalized abdominal pains.    Objective: Vital signs in last 24 hours: Vitals:   05/12/22 2046 05/12/22 2246 05/13/22 0359 05/13/22 0852  BP: (!) 127/53 (!) 142/70 (!) 112/48 122/64  Pulse: (!) 104 97 92 99  Resp: 17 18 20 18   Temp: 99.2 F (37.3 C) 99.3 F (37.4 C) 98 F (36.7 C) 98.3 F (36.8 C)  TempSrc: Oral Oral  Oral  SpO2: 95% 100% 97% 97%  Weight:      Height:       Weight change:   Intake/Output Summary (Last 24 hours) at 05/13/2022 1113 Last data filed at 05/12/2022 1508 Gross per 24 hour  Intake 235.43 ml  Output --  Net 235.43 ml     Exam: Heart:: Regular rate and rhythm Lungs: normal Abdomen: generalized distension , mild generalized tenderness, bs +, no guarding or rigidity   Lab Results: @LABTEST2 @ Micro Results: No results found for this or any previous visit (from the past 240 hour(s)). Studies/Results: US Paracentesis  Result Date: 05/12/2022 INDICATION: Ascites, possible spontaneous bacterial peritonitis EXAM: ULTRASOUND GUIDED  PARACENTESIS MEDICATIONS: Lidocaine 1% subcutaneous COMPLICATIONS: None immediate. PROCEDURE: Informed written consent was obtained from the patient after a discussion of the risks, benefits and alternatives to treatment. A timeout was performed prior to the initiation of the procedure. Initial ultrasound scanning demonstrates a small amount of infrahepatic ascites within the right lower abdominal quadrant. The right lower abdomen was prepped and draped in the usual sterile fashion. 1% lidocaine was used for local anesthesia. Following this, under real-time ultrasound guidance a Safe-T-Centesis catheter  was introduced. An ultrasound image was saved for documentation purposes. The paracentesis was performed. Only scant volume obtained. The catheter was removed and a dressing was applied. The patient tolerated the procedure well without immediate post procedural complication. FINDINGS: A total of less than 5 mL clear yellow fluid was removed. Samples were sent to the laboratory as requested by the clinical team. IMPRESSION: Successful ultrasound-guided paracentesis yielding less than 5 mL of peritoneal fluid. Electronically Signed   By: Lucrezia Europe M.D.   On: 05/12/2022 15:11   US ABDOMEN LIMITED RUQ (LIVER/GB)  Result Date: 05/11/2022 CLINICAL DATA:  Abdominal pain EXAM: ULTRASOUND ABDOMEN LIMITED RIGHT UPPER QUADRANT COMPARISON:  No prior right upper quadrant ultrasound available, correlation is made with 05/11/2022 CT abdomen pelvis FINDINGS: Gallbladder: Level sludge is noted within the gallbladder. No gallstones. The gallbladder is somewhat distended, with the wall measuring up to 4 mm, just above the upper limit of normal. No pericholecystic fluid. No sonographic Murphy sign noted by sonographer. Common bile duct: Diameter: 6 mm, within normal limits. No intrahepatic biliary ductal dilatation. Liver: No focal lesion identified. Increased parenchymal echogenicity, which limits sonographic penetration. Portal vein is patent on color Doppler imaging with likely reversal of blood flow, away from the liver. Other: None. IMPRESSION: 1. Gallbladder sludge and mild wall thickening, just above upper limit of normal. No pericholecystic fluid or sonographic Murphy sign. Findings are equivocal for acute cholecystitis and may be related to the patient's history of cirrhosis. 2. Portal vein is patent with likely reversal of blood flow. Electronically Signed   By: Bryson Ha  Vasan M.D.   On: 05/11/2022 14:17   CT Abdomen Pelvis W Contrast  Result Date: 05/11/2022 CLINICAL DATA:  Abdominal pain EXAM: CT ABDOMEN AND PELVIS  WITH CONTRAST TECHNIQUE: Multidetector CT imaging of the abdomen and pelvis was performed using the standard protocol following bolus administration of intravenous contrast. RADIATION DOSE REDUCTION: This exam was performed according to the departmental dose-optimization program which includes automated exposure control, adjustment of the mA and/or kV according to patient size and/or use of iterative reconstruction technique. CONTRAST:  100 mL OMNIPAQUE IOHEXOL 300 MG/ML  SOLN COMPARISON:  03/12/2022 FINDINGS: Lower chest: Bibasilar linear subsegmental atelectasis. No pleural or pericardial effusion identified. Perigastric and perisplenic varices. Hepatobiliary: Distended gallbladder. Enlarged liver with a slightly nodular contour. No focal hepatic parenchymal lesions or biliary ductal dilatation identified. Pancreas: Unremarkable. No pancreatic ductal dilatation or surrounding inflammatory changes. Spleen: Grossly enlarged, 19 cm Adrenals/Urinary Tract: Adrenal glands are unremarkable. Kidneys are normal, without renal calculi, focal lesion, or hydronephrosis. Bladder is unremarkable. Stomach/Bowel: Stomach is within normal limits. Appendix appears normal. No evidence of bowel wall thickening, distention, or inflammatory changes. Vascular/Lymphatic: No suspicious adenopathy identified. No aortic aneurysm. Reproductive: Prostate is unremarkable. Other: No abdominal wall hernia or abnormality. Small amounts of ascites noted along the pericolic gutters And in the pelvis. Musculoskeletal: No acute or significant osseous findings. IMPRESSION: 1. Hepatosplenomegaly. 2. Hepatic cirrhosis. 3. Distended gallbladder. 4. Perigastric and perisplenic varices. 5. Small ascites. Electronically Signed   By: Sammie Bench M.D.   On: 05/11/2022 12:05   Medications: I have reviewed the patient's current medications. Scheduled Meds:  amLODipine  5 mg Oral Daily   docusate sodium  100 mg Oral BID   enoxaparin (LOVENOX)  injection  0.5 mg/kg Subcutaneous [id number]   folic acid  1 mg Oral Daily   furosemide  40 mg Oral Daily   lactulose  15 g Oral BID   multivitamin with minerals  1 tablet Oral Daily   senna  1 tablet Oral BID   spironolactone  100 mg Oral Daily   thiamine  100 mg Oral Daily   Or   thiamine  100 mg Intravenous Daily   Continuous Infusions:  cefTRIAXone (ROCEPHIN)  IV Stopped (05/12/22 1210)   metronidazole 500 mg (05/13/22 1036)   potassium chloride 10 mEq (05/13/22 1028)   PRN Meds:.acetaminophen **OR** acetaminophen, LORazepam, ondansetron **OR** ondansetron (ZOFRAN) IV, oxyCODONE   Assessment: Principal Problem:   Abdominal distention, non-gaseous Active Problems:   Alcohol dependence with withdrawal (HCC)   GERD (gastroesophageal reflux disease)   Hypokalemia   Alcoholic cirrhosis (HCC)   Obesity (BMI 30-39.9)   Elevated bilirubin   Generalized abdominal pain   Cameron Santiago is a 27 y.o. 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  Presents to the emergency room with abdominal distention and pain I was consulted for evaluation SBP, acetic fluid has been taken last evening but results are not yet available.  The patient has been seen by general surgery and felt to not have any acute surgical issues at this point of time.  Already started on antibiotics for SBP, ascitic fluid was minimum       Plan 1.  Follow-up on ascetic fluid to evaluate for SBP and if present continue antibiotics for 5 days and reassess for clinical improvement. Serial abdominal exams.  2.  IV thiamine to prevent Wernicke's encephalopathy 3.  Stop all alcohol use advised associated with very high mortality 4.  As an outpatient will require an EGD to screen for esophageal versus and full autoimmune and viral hepatitis workup to  determine why he has cirrhosis at the age of 39 which could obviously be from alcohol but other conditions such as Wilson's disease to be ruled out 5.  Treat for any alcohol withdrawal by placing on protocol 6. With concern for SBP , steroids for alcoholic hepatitis is contraindicated  7. He would absolutely have to stop all alcohol use.  8. Good nutrition is treatment for alcoholic hepatitis  9. Serum creatinine has doubled - consider albumin infusions  10. Correct electrolytes     LOS: 2 days   [name], MD 05/13/2022, 11:13 AM

## 2022-05-13 NOTE — Hospital Course (Addendum)
This 27 y.o. male with PMH significant for EtOH liver cirrhosis, ongoing EtOH use, Essential hypertension, presented in the ED with abdominal distention associated with abdominal pain for last few days.  Patient drinks  ETOH regularly, 1 pint of hard liquor every day.  Patient denies any nausea,  vomiting. Patient also reports subjective fever at night with chills, He denies any GI bleeding, dizziness, weakness, urinary symptoms, denies any recent travel or  URI symptoms.  03/18: ED workup: Sodium 131, potassium 2.5, anion gap 13, magnesium 1.8, alkaline phosphatase 107, albumin 2.1, lipase 75, AST 169, ALT 21, ALT 9.4, total bilirubin 28.4, total protein 9.4, WBC 14.4, hemoglobin 10.9, hematocrit 31.2, MCV 104.7, platelet 192, influenza negative, COVID-negative, RSV negative, UA: Ketones negative, LE negative, nitrate negative. CTA/P:  Hepatosplenomegaly, Hepatic cirrhosis, Distended gallbladder, Perigastric and perisplenic varices. Small ascites. Admitted for treatment and monitoring EtOH withdrawal  03/19:  GI saw patient. NPO 03/20: Advance to CLD. Pt is concerned about EtOH w/drawal, remains on CIWA protocol  03/21: liver doppler (+)cirrhosis and portal HTN, advancing to full liquids.  03/22: advancing diet. (+)cough and rales, CXR showing cardiomegaly and vascular congestion --> lasix + albumin, Echo to eval further.  03/23: no concerns on Echocardiogram w/ EF 60-65% no RWMA and no diastolic df, diuresed well yesterday will repeat Lasix today. Dr Allen Norris w/ GI checked up on previous paracentesis labs and no SBP, ok to start steroids, he will be placing these orders. Given deterioration on labs / increased MELD score, Dr Allen Norris also spoke w/ [facility] transplant team - given previous pancreatitis treatment, patient is not a transplant candidate.  03/24: hyponatremia worse to 123, reduced spironolactone. GI following. D/c abx. Bili slightly improved on steroids. GI and hospitalist teams have discussed that he is not  a transplant candidate - see GI note from today.  03/25: sodium improved to 126. Bili slightly improved as well to 25. Continuing steroids. Per GI, "advanced liver cirrhosis likely from alcohol abuse. His other labs did not show any cause for his cirrhosis. The patient should continue with supportive care. Nothing further to do from a GI point of view at this time." 03/26: bilirubin down to 24, sodium 128, WBC increased to 18 w/ neutrophils, likely d/t steroids however pt reports subjective fever/chills, no cough, abdominal pain is stable. Getting CXR given some concern for infiltrate on previous but no obvious pneumonia, low threshold for restarting ceftriaxone, he declined paracentesis but may need to revisit this. Will leave on steroids for now, low suspicion for infection. Increased lasix. 3/27.  Patient hesitant about going home.  I will uptitrate spironolactone so hopefully we can get off potassium supplementation.  Declined another paracentesis. 3/28.  Patient does not feel well today.  Did not sleep well.  Declined paracentesis. 3/29.  Patient feels worse today with nausea vomiting, abdominal pain.  Patient also has left leg pain left lateral thigh.  Patient declined paracentesis.  Patient declined MRI of his abdomen and or his thigh.  Will continue to monitor.  With temperature dropping to 95.5 will start empiric antibiotics. 3/30.  White blood cell count down slightly to 24.8.  Patient still having pain in the left leg.  Ultrasound left lower extremity negative for DVT.  Wondering if pain is secondary to high-dose steroids.  Will start to taper prednisone for tomorrow down to 30 mg daily.  CK normal range.  Patient declined MRI abdomen or MRI left leg. 3/31.  Patient still not feeling well.  Asking about prognosis.  Left leg pain persists.  MRI of the leg showed a large fluid collection likely hematoma 4/1.  Spoke with interventional radiology team.  They got an ultrasound and did a fluid  aspiration and they think it is hematoma.  Red blood cells 11,413.  White blood cell count 2817 with 72% lymphocytes.  On empiric Rocephin.  Await Gram stain and culture. 4/2.  Gram stain still pending but culture shows no growth after 1 day.  Patient still having a lot of pain and increased oxycodone to 10 mg as needed.  Procedures: 05/11/22 Paracentesis  05/25/2022.  Leg aspiration.

## 2022-05-13 NOTE — TOC Progression Note (Signed)
Transition of Care [facility]) - Progression Note    Patient Details  Name: Cameron Santiago MRN: [id number] Date of Birth: September 05, 1995  Transition of Care [facility]) CM/SW Contact  Laurena Slimmer, RN Phone Number: 05/13/2022, 4:31 PM  Clinical Narrative:    Case reviewed for DME needs and changes in discharge disposition.         Expected Discharge Plan and Services                                               Social Determinants of Health (SDOH) Interventions SDOH Screenings   Food Insecurity: No Food Insecurity (05/11/2022)  Housing: Low Risk  (05/11/2022)  Transportation Needs: Unmet Transportation Needs (05/11/2022)  Utilities: Not At Risk (05/11/2022)  Tobacco Use: Low Risk  (05/11/2022)    Readmission Risk Interventions     No data to display

## 2022-05-14 ENCOUNTER — Inpatient Hospital Stay: Payer: Medicaid Other

## 2022-05-14 DIAGNOSIS — K7030 Alcoholic cirrhosis of liver without ascites: Secondary | ICD-10-CM

## 2022-05-14 DIAGNOSIS — F10230 Alcohol dependence with withdrawal, uncomplicated: Secondary | ICD-10-CM

## 2022-05-14 DIAGNOSIS — E669 Obesity, unspecified: Secondary | ICD-10-CM

## 2022-05-14 DIAGNOSIS — K7011 Alcoholic hepatitis with ascites: Principal | ICD-10-CM

## 2022-05-14 DIAGNOSIS — K219 Gastro-esophageal reflux disease without esophagitis: Secondary | ICD-10-CM

## 2022-05-14 DIAGNOSIS — R17 Unspecified jaundice: Secondary | ICD-10-CM

## 2022-05-14 DIAGNOSIS — E876 Hypokalemia: Secondary | ICD-10-CM

## 2022-05-14 DIAGNOSIS — R1084 Generalized abdominal pain: Secondary | ICD-10-CM

## 2022-05-14 LAB — COMPREHENSIVE METABOLIC PANEL
ALT: 20 U/L (ref 0–44)
AST: 116 U/L — ABNORMAL HIGH (ref 15–41)
Albumin: 2 g/dL — ABNORMAL LOW (ref 3.5–5.0)
Alkaline Phosphatase: 92 U/L (ref 38–126)
Anion gap: 9 (ref 5–15)
BUN: 14 mg/dL (ref 6–20)
CO2: 28 mmol/L (ref 22–32)
Calcium: 7.5 mg/dL — ABNORMAL LOW (ref 8.9–10.3)
Chloride: 90 mmol/L — ABNORMAL LOW (ref 98–111)
Creatinine, Ser: 0.6 mg/dL — ABNORMAL LOW (ref 0.61–1.24)
GFR, Estimated: >60 mL/min (ref >60)
Glucose, Bld: 94 mg/dL (ref 70–99)
Potassium: 3.3 mmol/L — ABNORMAL LOW (ref 3.5–5.1)
Sodium: 127 mmol/L — ABNORMAL LOW (ref 135–145)
Total Bilirubin: 27.5 mg/dL (ref 0.3–1.2)
Total Protein: 8.3 g/dL — ABNORMAL HIGH (ref 6.5–8.1)

## 2022-05-14 LAB — HEPATITIS B SURFACE ANTIGEN: Hepatitis B Surface Ag: NONREACTIVE

## 2022-05-14 LAB — CBC
HCT: 28.5 % — ABNORMAL LOW (ref 39.0–52.0)
Hemoglobin: 10.1 g/dL — ABNORMAL LOW (ref 13.0–17.0)
MCH: 37.7 pg — ABNORMAL HIGH (ref 26.0–34.0)
MCHC: 35.4 g/dL (ref 30.0–36.0)
MCV: 106.3 fL — ABNORMAL HIGH (ref 80.0–100.0)
Platelets: 156 10*3/uL (ref 150–400)
RBC: 2.68 MIL/uL — ABNORMAL LOW (ref 4.22–5.81)
RDW: 20.2 % — ABNORMAL HIGH (ref 11.5–15.5)
WBC: 14.6 10*3/uL — ABNORMAL HIGH (ref 4.0–10.5)
nRBC: 0.2 % (ref 0.0–0.2)

## 2022-05-14 LAB — PROTIME-INR
INR: 3.2 — ABNORMAL HIGH (ref 0.8–1.2)
Prothrombin Time: 32.2 seconds — ABNORMAL HIGH (ref 11.4–15.2)

## 2022-05-14 MED ORDER — VITAMIN K1 10 MG/ML IJ SOLN
10.0000 mg | Freq: Every day | INTRAMUSCULAR | Status: AC
  Administered 2022-05-14 – 2022-05-16 (×3): 10 mg via SUBCUTANEOUS
  Filled 2022-05-14 (×4): qty 1

## 2022-05-14 MED ORDER — SPIRONOLACTONE 25 MG PO TABS
50.0000 mg | ORAL_TABLET | Freq: Every day | ORAL | Status: DC
  Administered 2022-05-14 – 2022-05-16 (×3): 50 mg via ORAL
  Filled 2022-05-14 (×2): qty 2

## 2022-05-14 MED ORDER — ORAL CARE MOUTH RINSE: 15.0000 mL | OROMUCOSAL | Status: DC | PRN

## 2022-05-14 MED ORDER — POTASSIUM CHLORIDE 10 MEQ/100ML IV SOLN
10.0000 meq | INTRAVENOUS | Status: AC
  Administered 2022-05-14 (×2): 10 meq via INTRAVENOUS
  Filled 2022-05-14 (×4): qty 100

## 2022-05-14 MED ORDER — MORPHINE SULFATE (PF) 2 MG/ML IV SOLN
2.0000 mg | INTRAVENOUS | Status: DC | PRN
  Administered 2022-05-14 – 2022-05-19 (×14): 2 mg via INTRAVENOUS
  Filled 2022-05-14 (×14): qty 1

## 2022-05-14 NOTE — Progress Notes (Signed)
Cameron Santiago , MD [address] [place], [place], [place], [id number], [place], [place], [place], [address] Phone: [phone]  Fax: [phone]   Cameron Santiago is being followed for acute liver failure  Day 3 of follow up   Subjective: Feel better had a temperature of 38.5 yesterday.   Objective: Vital signs in last 24 hours: Vitals:   05/13/22 2330 05/14/22 0057 05/14/22 0320 05/14/22 0810  BP:  (!) 117/49 (!) 123/48 (!) 148/81  Pulse:  96 92 92  Resp:  19 (!) 21 16  Temp: (!) 101.3 F (38.5 C) 99.3 F (37.4 C) 98.5 F (36.9 C) 98.6 F (37 C)  TempSrc: Oral Oral Oral   SpO2:  93% 90% 98%  Weight:      Height:       Weight change:   Intake/Output Summary (Last 24 hours) at 05/14/2022 1124 Last data filed at 05/14/2022 1054 Gross per 24 hour  Intake 1963.87 ml  Output --  Net 1963.87 ml     Exam: Heart:: Regular rate and rhythm Lungs: normal Abdomen: soft, nontender, normal bowel sounds   Lab Results: @LABTEST2 @ Micro Results: No results found for this or any previous visit (from the past 240 hour(s)). Studies/Results: No results found. Medications: I have reviewed the patient's current medications. Scheduled Meds:  amLODipine  5 mg Oral Daily   docusate sodium  100 mg Oral BID   enoxaparin (LOVENOX) injection  0.5 mg/kg Subcutaneous [id number]   folic acid  1 mg Oral Daily   lactulose  15 g Oral BID   multivitamin with minerals  1 tablet Oral Daily   phytonadione  10 mg Subcutaneous Daily   senna  1 tablet Oral BID   spironolactone  50 mg Oral Daily   thiamine  100 mg Oral Daily   Or   thiamine  100 mg Intravenous Daily   Continuous Infusions:  cefTRIAXone (ROCEPHIN)  IV Stopped (05/13/22 1207)   metronidazole 500 mg (05/14/22 1026)   PRN Meds:.ondansetron **OR** ondansetron (ZOFRAN) IV, mouth rinse, oxyCODONE   Assessment: Principal Problem:   Ascites d/e alcoholic hepatitis/cirrhosis vs other or comorbid hepatic  abnormality such as WIlson's Active Problems:   Alcohol dependence with withdrawal (HCC)   GERD (gastroesophageal reflux disease)   Hypokalemia   Alcoholic cirrhosis (HCC)   Obesity (BMI 30-39.9)   Elevated bilirubin   Generalized abdominal pain  Cameron Santiago is a 27 y.o. 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  Presents to the emergency room with abdominal distention and pain I was consulted for evaluation SBP, acetic fluid has been taken last evening but results are not yet available.  The patient has been seen by general surgery and felt to not have any acute surgical issues at this point of time.  Already started on antibiotics for SBP, ascitic fluid was minimum.  INR 3.7 indicating acute liver failure     Plan 1. I have d.c his order for tyelnol QID PRN due to liver failure    2.  IV thiamine to prevent Wernicke's encephalopathy 3.  Stop all alcohol use advised associated with very high mortality 4.  As an outpatient will require an EGD to screen for esophageal versus and full autoimmune  workup to determine why he has cirrhosis at the age  of 26 which could obviously be from alcohol but other conditions such as Wilson's disease to be ruled out 5.  Treat for any alcohol withdrawal 6. With concern for SBP , steroids for alcoholic hepatitis is contraindicated  7. S.C vitamin K daily once for 3 days  8. Good nutrition is treatment for alcoholic hepatitis  9. Serum creatinine has doubled - consider albumin infusions , I have d/c his lasix , has minimal ascites treat with low salt diet  10. I have ordered liver doppler to rule out PVT. Labs ordered for acute viral hepatitis.       LOS: 3 days   Cameron Bellows, MD 05/14/2022, 11:24 AM

## 2022-05-14 NOTE — Progress Notes (Signed)
PROGRESS NOTE    Cameron Santiago   [id number] DOB: Jun 25, 1995  DOA: 05/11/2022 Date of Service: 05/14/22 PCP: Merryl Hacker, No     Brief Narrative / Hospital Course:  This 27 y.o. male with PMH significant for EtOH liver cirrhosis, ongoing EtOH use, Essential hypertension, presented in the ED with abdominal distention associated with abdominal pain for last few days.  Patient drinks  ETOH regularly, 1 pint of hard liquor every day.  Patient denies any nausea,  vomiting. Patient also reports subjective fever at night with chills, He denies any GI bleeding, dizziness, weakness, urinary symptoms, denies any recent travel or  URI symptoms.  03/18: ED workup: Sodium 131, potassium 2.5, anion gap 13, magnesium 1.8, alkaline phosphatase 107, albumin 2.1, lipase 75, AST 169, ALT 21, ALT 9.4, total bilirubin 28.4, total protein 9.4, WBC 14.4, hemoglobin 10.9, hematocrit 31.2, MCV 104.7, platelet 192, influenza negative, COVID-negative, RSV negative, UA: Ketones negative, LE negative, nitrate negative. CTA/P:  Hepatosplenomegaly, Hepatic cirrhosis, Distended gallbladder, Perigastric and perisplenic varices.Small ascites. Admitted for treatment and monitoring EtOH withdrawal  03/19:  GI saw patient. NPO 03/20: Advance to CLD. Pt is concerned about EtOH w/drawal, remains on CIWA protocol  03/21: liver doppler pending, advancing to full liquids.   Consultants:  General surgery  Gastroenterology   Procedures: 05/11/22 Paracentesis       ASSESSMENT & PLAN:   Principal Problem:   Ascites d/e alcoholic hepatitis/cirrhosis vs other or comorbid hepatic abnormality such as WIlson's Active Problems:   Alcoholic cirrhosis (HCC)   Alcohol dependence with withdrawal (HCC)   Hypokalemia   GERD (gastroesophageal reflux disease)   Obesity (BMI 30-39.9)   Elevated bilirubin   Generalized abdominal pain   Ascites and abdominal distension d/t alcoholic hepatitis/cirrhosis vs other or comorbid hepatic  abnormality such as WIlson's Alcoholic cirrhosis Concern for SBP Follow-up on ascites fluid analysis to evaluate for SBP - if present continue antibiotics for 5 days and reassess for clinical improvement. There was minimal fluid collected so analysis may be incomplete - continue empiric SBP tx  Serial abdominal exam - of change/worse obtain CT abdomen or preferably CT angiogram to rule out mesenteric ischemia  Abstain EtOH As an outpatient will require an EGD to screen for esophageal versus and full autoimmune and viral hepatitis workup to determine why he has cirrhosis at the age of 79 which could obviously be from alcohol but other conditions such as Wilson's disease to be ruled out With concern for SBP , steroids for alcoholic hepatitis is contraindicated  Correct electrolytes   Alcohol dependence with withdrawal  IV thiamine to prevent Wernicke's encephalopathy CIWA protocol   Hypokalemia Replace as needed Monitor BMP    DVT prophylaxis: SCD Pertinent IV fluids/nutrition: no continuous IF fluids  Central lines / invasive devices: none   Code Status: FULL CODE  Current Admission Status: inpatient  TOC needs / Dispo plan: none at this time Barriers to discharge / significant pending items: clinical improvement              Subjective / Brief ROS:  Patient reports abdominal pain is not well controlled, comes and goes, not really worsening  Denies CP/SOB.  Denies new weakness.  Requesting advanced diet  Reports no concerns w/ urination/defecation.   Family Communication: support person at bedside on rounds     Objective Findings:  Vitals:   05/14/22 0057 05/14/22 0320 05/14/22 0810 05/14/22 1142  BP: (!) 117/49 (!) 123/48 (!) 148/81 125/62  Pulse: 96 92 92 92  Resp: 19 (!) 21 16 16   Temp: 99.3 F (37.4 C) 98.5 F (36.9 C) 98.6 F (37 C) 98.6 F (37 C)  TempSrc: Oral Oral    SpO2: 93% 90% 98% 90%  Weight:      Height:        Intake/Output Summary  (Last 24 hours) at 05/14/2022 1413 Last data filed at 05/14/2022 1054 Gross per 24 hour  Intake 1963.87 ml  Output --  Net 1963.87 ml   Filed Weights   05/11/22 0935  Weight: 122.9 kg    Examination:  Physical Exam Constitutional:      General: He is not in acute distress.    Appearance: He is obese. He is ill-appearing. He is not toxic-appearing.  Eyes:     General: Scleral icterus present.     Extraocular Movements: Extraocular movements intact.  Cardiovascular:     Rate and Rhythm: Normal rate and regular rhythm.  Pulmonary:     Effort: Pulmonary effort is normal.     Breath sounds: Normal breath sounds.  Abdominal:     General: Abdomen is protuberant. Bowel sounds are normal. There is no distension.     Palpations: Abdomen is soft.     Tenderness: There is abdominal tenderness in the epigastric area.  Skin:    General: Skin is warm and dry.     Coloration: Skin is jaundiced.  Neurological:     General: No focal deficit present.     Mental Status: He is alert.  Psychiatric:        Behavior: Behavior normal.          Scheduled Medications:   amLODipine  5 mg Oral Daily   docusate sodium  100 mg Oral BID   enoxaparin (LOVENOX) injection  0.5 mg/kg Subcutaneous [id number]   folic acid  1 mg Oral Daily   lactulose  15 g Oral BID   multivitamin with minerals  1 tablet Oral Daily   phytonadione  10 mg Subcutaneous Daily   senna  1 tablet Oral BID   spironolactone  50 mg Oral Daily   thiamine  100 mg Oral Daily   Or   thiamine  100 mg Intravenous Daily    Continuous Infusions:  cefTRIAXone (ROCEPHIN)  IV 2 g (05/14/22 1135)   metronidazole 500 mg (05/14/22 1026)   potassium chloride      PRN Medications:  morphine injection, ondansetron **OR** ondansetron (ZOFRAN) IV, mouth rinse, oxyCODONE  Antimicrobials from admission:  Anti-infectives (From admission, onward)    Start     Dose/Rate Route Frequency Ordered Stop   05/12/22 1100  cefTRIAXone (ROCEPHIN) 2  g in sodium chloride 0.9 % 100 mL IVPB        2 g 200 mL/hr over 30 Minutes Intravenous Every 24 hours 05/11/22 1446     05/11/22 2200  metroNIDAZOLE (FLAGYL) IVPB 500 mg  Status:  Discontinued        500 mg 100 mL/hr over 60 Minutes Intravenous Every 8 hours 05/11/22 1446 05/11/22 1454   05/11/22 2200  metroNIDAZOLE (FLAGYL) IVPB 500 mg        500 mg 100 mL/hr over 60 Minutes Intravenous Every 12 hours 05/11/22 1454     05/11/22 1500  cefTRIAXone (ROCEPHIN) 1 g in sodium chloride 0.9 % 100 mL IVPB  Status:  Discontinued        1 g 200 mL/hr over 30 Minutes Intravenous  Once 05/11/22 1449 05/12/22 1607   05/11/22 1430  metroNIDAZOLE (FLAGYL)  IVPB 500 mg        500 mg 100 mL/hr over 60 Minutes Intravenous  Once 05/11/22 1421 05/11/22 1530   05/11/22 1030  cefTRIAXone (ROCEPHIN) 1 g in sodium chloride 0.9 % 100 mL IVPB        1 g 200 mL/hr over 30 Minutes Intravenous  Once 05/11/22 1024 05/11/22 1220           Data Reviewed:  I have personally reviewed the following...  CBC: Recent Labs  Lab 05/11/22 0939 05/11/22 1525 05/12/22 0530 05/13/22 0543 05/14/22 0637  WBC 14.4* 13.5* 16.2* 15.7* 14.6*  HGB 10.9* 9.8* 9.5* 9.7* 10.1*  HCT 31.2* 27.9* 27.2* 27.5* 28.5*  MCV 104.7* 104.5* 106.3* 104.6* 106.3*  PLT 192 173 161 163 [id number]   Basic Metabolic Panel: Recent Labs  Lab 05/11/22 0939 05/11/22 1130 05/11/22 1525 05/12/22 0530 05/13/22 0543 05/13/22 1538 05/14/22 0637  NA 131*  --   --  129* 128*  --  127*  K 2.5*  --   --  3.1* 2.6* 3.2* 3.3*  CL 90*  --   --  90* 90*  --  90*  CO2 28  --   --  30 28  --  28  GLUCOSE 111*  --   --  96 95  --  94  BUN <5*  --   --  7 13  --  14  CREATININE 0.33*  --  <0.30* 0.35* 0.64  --  0.60*  CALCIUM 7.9*  --   --  7.4* 7.3*  --  7.5*  MG  --  1.8  --  1.5* 1.6*  --   --   PHOS  --   --   --  4.3 3.7  --   --    GFR: Estimated Creatinine Clearance: 188.3 mL/min (A) (by C-G formula based on SCr of 0.6 mg/dL (L)). Liver  Function Tests: Recent Labs  Lab 05/11/22 0939 05/12/22 0530 05/13/22 0543 05/14/22 0637  AST 169* 154* 123* 116*  ALT 21 22 21 20   ALKPHOS 107 88 82 92  BILITOT 28.4* 26.4* 26.7* 27.5*  PROT 9.4* 8.2* 8.2* 8.3*  ALBUMIN 2.1* 1.8* 1.8* 2.0*   Recent Labs  Lab 05/11/22 0939  LIPASE 75*   No results for input(s): "AMMONIA" in the last 168 hours. Coagulation Profile: Recent Labs  Lab 05/12/22 1155 05/14/22 0637  INR 2.7* 3.2*   Cardiac Enzymes: No results for input(s): "CKTOTAL", "CKMB", "CKMBINDEX", "TROPONINI" in the last 168 hours. BNP (last 3 results) No results for input(s): "PROBNP" in the last 8760 hours. HbA1C: No results for input(s): "HGBA1C" in the last 72 hours. CBG: No results for input(s): "GLUCAP" in the last 168 hours. Lipid Profile: No results for input(s): "CHOL", "HDL", "LDLCALC", "TRIG", "CHOLHDL", "LDLDIRECT" in the last 72 hours. Thyroid Function Tests: No results for input(s): "TSH", "T4TOTAL", "FREET4", "T3FREE", "THYROIDAB" in the last 72 hours. Anemia Panel: No results for input(s): "VITAMINB12", "FOLATE", "FERRITIN", "TIBC", "IRON", "RETICCTPCT" in the last 72 hours. Most Recent Urinalysis On File:     Component Value Date/Time   COLORURINE AMBER (A) 05/11/2022 1447   APPEARANCEUR CLEAR (A) 05/11/2022 1447   LABSPEC 1.033 (H) 05/11/2022 1447   PHURINE 7.0 05/11/2022 1447   GLUCOSEU NEGATIVE 05/11/2022 1447   HGBUR SMALL (A) 05/11/2022 1447   BILIRUBINUR MODERATE (A) 05/11/2022 1447   KETONESUR NEGATIVE 05/11/2022 1447   PROTEINUR NEGATIVE 05/11/2022 1447   NITRITE NEGATIVE 05/11/2022 1447   LEUKOCYTESUR NEGATIVE  05/11/2022 1447   Sepsis Labs: @LABRCNTIP (procalcitonin:4,lacticidven:4) Microbiology: No results found for this or any previous visit (from the past 240 hour(s)).    Radiology Studies last 3 days: US Paracentesis  Result Date: 05/12/2022 INDICATION: Ascites, possible spontaneous bacterial peritonitis EXAM: ULTRASOUND  GUIDED  PARACENTESIS MEDICATIONS: Lidocaine 1% subcutaneous COMPLICATIONS: None immediate. PROCEDURE: Informed written consent was obtained from the patient after a discussion of the risks, benefits and alternatives to treatment. A timeout was performed prior to the initiation of the procedure. Initial ultrasound scanning demonstrates a small amount of infrahepatic ascites within the right lower abdominal quadrant. The right lower abdomen was prepped and draped in the usual sterile fashion. 1% lidocaine was used for local anesthesia. Following this, under real-time ultrasound guidance a Safe-T-Centesis catheter was introduced. An ultrasound image was saved for documentation purposes. The paracentesis was performed. Only scant volume obtained. The catheter was removed and a dressing was applied. The patient tolerated the procedure well without immediate post procedural complication. FINDINGS: A total of less than 5 mL clear yellow fluid was removed. Samples were sent to the laboratory as requested by the clinical team. IMPRESSION: Successful ultrasound-guided paracentesis yielding less than 5 mL of peritoneal fluid. Electronically Signed   By: Lucrezia Europe M.D.   On: 05/12/2022 15:11   US ABDOMEN LIMITED RUQ (LIVER/GB)  Result Date: 05/11/2022 CLINICAL DATA:  Abdominal pain EXAM: ULTRASOUND ABDOMEN LIMITED RIGHT UPPER QUADRANT COMPARISON:  No prior right upper quadrant ultrasound available, correlation is made with 05/11/2022 CT abdomen pelvis FINDINGS: Gallbladder: Level sludge is noted within the gallbladder. No gallstones. The gallbladder is somewhat distended, with the wall measuring up to 4 mm, just above the upper limit of normal. No pericholecystic fluid. No sonographic Murphy sign noted by sonographer. Common bile duct: Diameter: 6 mm, within normal limits. No intrahepatic biliary ductal dilatation. Liver: No focal lesion identified. Increased parenchymal echogenicity, which limits sonographic penetration.  Portal vein is patent on color Doppler imaging with likely reversal of blood flow, away from the liver. Other: None. IMPRESSION: 1. Gallbladder sludge and mild wall thickening, just above upper limit of normal. No pericholecystic fluid or sonographic Murphy sign. Findings are equivocal for acute cholecystitis and may be related to the patient's history of cirrhosis. 2. Portal vein is patent with likely reversal of blood flow. Electronically Signed   By: Merilyn Baba M.D.   On: 05/11/2022 14:17   CT Abdomen Pelvis W Contrast  Result Date: 05/11/2022 CLINICAL DATA:  Abdominal pain EXAM: CT ABDOMEN AND PELVIS WITH CONTRAST TECHNIQUE: Multidetector CT imaging of the abdomen and pelvis was performed using the standard protocol following bolus administration of intravenous contrast. RADIATION DOSE REDUCTION: This exam was performed according to the departmental dose-optimization program which includes automated exposure control, adjustment of the mA and/or kV according to patient size and/or use of iterative reconstruction technique. CONTRAST:  100 mL OMNIPAQUE IOHEXOL 300 MG/ML  SOLN COMPARISON:  03/12/2022 FINDINGS: Lower chest: Bibasilar linear subsegmental atelectasis. No pleural or pericardial effusion identified. Perigastric and perisplenic varices. Hepatobiliary: Distended gallbladder. Enlarged liver with a slightly nodular contour. No focal hepatic parenchymal lesions or biliary ductal dilatation identified. Pancreas: Unremarkable. No pancreatic ductal dilatation or surrounding inflammatory changes. Spleen: Grossly enlarged, 19 cm Adrenals/Urinary Tract: Adrenal glands are unremarkable. Kidneys are normal, without renal calculi, focal lesion, or hydronephrosis. Bladder is unremarkable. Stomach/Bowel: Stomach is within normal limits. Appendix appears normal. No evidence of bowel wall thickening, distention, or inflammatory changes. Vascular/Lymphatic: No suspicious adenopathy identified. No aortic aneurysm.  Reproductive: Prostate is unremarkable. Other: No abdominal wall hernia or abnormality. Small amounts of ascites noted along the pericolic gutters And in the pelvis. Musculoskeletal: No acute or significant osseous findings. IMPRESSION: 1. Hepatosplenomegaly. 2. Hepatic cirrhosis. 3. Distended gallbladder. 4. Perigastric and perisplenic varices. 5. Small ascites. Electronically Signed   By: Sammie Bench M.D.   On: 05/11/2022 12:05             LOS: 3 days       Emeterio Reeve, DO [facility] 05/14/2022, 2:13 PM    Dictation software may have been used to generate the above note. Typos may occur and escape review in typed/dictated notes. Please contact Dr Sheppard Coil directly for clarity if needed.  Staff may message me via secure chat in [place]  but this may not receive an immediate response,  please page me for urgent matters!  If 7PM-7AM, please contact night coverage [email or web address]

## 2022-05-15 ENCOUNTER — Inpatient Hospital Stay: Payer: Medicaid Other

## 2022-05-15 ENCOUNTER — Inpatient Hospital Stay (HOSPITAL_COMMUNITY)
Admit: 2022-05-15 | Discharge: 2022-05-15 | Disposition: A | Discharge status: Home / Self Care | Payer: Medicaid Other | Attending: Osteopathic Medicine | Admitting: Osteopathic Medicine

## 2022-05-15 DIAGNOSIS — I517 Cardiomegaly: Secondary | ICD-10-CM

## 2022-05-15 LAB — BASIC METABOLIC PANEL
Anion gap: 14 (ref 5–15)
BUN: 11 mg/dL (ref 6–20)
CO2: 30 mmol/L (ref 22–32)
Calcium: 7.8 mg/dL — ABNORMAL LOW (ref 8.9–10.3)
Chloride: 86 mmol/L — ABNORMAL LOW (ref 98–111)
Creatinine, Ser: 0.62 mg/dL (ref 0.61–1.24)
GFR, Estimated: >60 mL/min (ref >60)
Glucose, Bld: 100 mg/dL — ABNORMAL HIGH (ref 70–99)
Potassium: 3.2 mmol/L — ABNORMAL LOW (ref 3.5–5.1)
Sodium: 130 mmol/L — ABNORMAL LOW (ref 135–145)

## 2022-05-15 LAB — ECHOCARDIOGRAM COMPLETE
AR max vel: 2.39 cm2
AV Area VTI: 2.54 cm2
AV Area mean vel: 2.36 cm2
AV Mean grad: 9 mmHg
AV Peak grad: 13.7 mmHg
Ao pk vel: 1.85 m/s
Area-P 1/2: 4.08 cm2
Height: 71 in
MV VTI: 3.41 cm2
S' Lateral: 3.5 cm
Weight: 3971.81 oz

## 2022-05-15 LAB — HEPATITIS B VIRUS (PROFILE VI)
HEP B CORE AB: NEGATIVE
HEP B SURFACE AB: NONREACTIVE
HEP B SURFACE AG: NEGATIVE

## 2022-05-15 LAB — HCV RNA QUANT: HCV Quantitative: NOT DETECTED IU/mL (ref >50)

## 2022-05-15 LAB — HEPATITIS C ANTIBODY: HCV Ab: NONREACTIVE

## 2022-05-15 LAB — HEPATITIS A ANTIBODY, IGM: Hep A IgM: NONREACTIVE

## 2022-05-15 MED ORDER — POTASSIUM CHLORIDE 10 MEQ/100ML IV SOLN
10.0000 meq | INTRAVENOUS | Status: AC
  Administered 2022-05-15 (×4): 10 meq via INTRAVENOUS
  Filled 2022-05-15 (×4): qty 100

## 2022-05-15 MED ORDER — ALBUMIN HUMAN 25 % IV SOLN
25.0000 g | Freq: Once | INTRAVENOUS | Status: AC
  Administered 2022-05-15: 25 g via INTRAVENOUS
  Filled 2022-05-15: qty 100

## 2022-05-15 MED ORDER — FUROSEMIDE 10 MG/ML IJ SOLN
40.0000 mg | Freq: Once | INTRAMUSCULAR | Status: AC
  Administered 2022-05-15: 40 mg via INTRAVENOUS
  Filled 2022-05-15: qty 4

## 2022-05-15 MED ORDER — MAGNESIUM SULFATE 2 GM/50ML IV SOLN
2.0000 g | Freq: Once | INTRAVENOUS | Status: AC
  Administered 2022-05-15: 2 g via INTRAVENOUS
  Filled 2022-05-15: qty 50

## 2022-05-15 NOTE — Progress Notes (Signed)
PROGRESS NOTE    KERRINGTON OESTERLE   [id number] DOB: [birth year]  DOA: 05/11/2022 Date of Service: 05/15/22 PCP: Merryl Hacker, No     Brief Narrative / Hospital Course:  This 27 y.o. male with PMH significant for EtOH liver cirrhosis, ongoing EtOH use, Essential hypertension, presented in the ED with abdominal distention associated with abdominal pain for last few days.  Patient drinks  ETOH regularly, 1 pint of hard liquor every day.  Patient denies any nausea,  vomiting. Patient also reports subjective fever at night with chills, He denies any GI bleeding, dizziness, weakness, urinary symptoms, denies any recent travel or  URI symptoms.  03/18: ED workup: Sodium 131, potassium 2.5, anion gap 13, magnesium 1.8, alkaline phosphatase 107, albumin 2.1, lipase 75, AST 169, ALT 21, ALT 9.4, total bilirubin 28.4, total protein 9.4, WBC 14.4, hemoglobin 10.9, hematocrit 31.2, MCV 104.7, platelet 192, influenza negative, COVID-negative, RSV negative, UA: Ketones negative, LE negative, nitrate negative. CTA/P:  Hepatosplenomegaly, Hepatic cirrhosis, Distended gallbladder, Perigastric and perisplenic varices.Small ascites. Admitted for treatment and monitoring EtOH withdrawal  03/19:  GI saw patient. NPO 03/20: Advance to CLD. Pt is concerned about EtOH w/drawal, remains on CIWA protocol  03/21: liver doppler pending, advancing to full liquids.  03/22: advancing diet. (+)cough and rales, CXR showing cardiomegaly and vascular congestion --> lasix + albumin, Echo to eval further.   Consultants:  General surgery  Gastroenterology   Procedures: 05/11/22 Paracentesis       ASSESSMENT & PLAN:   Principal Problem:   Ascites d/e alcoholic hepatitis/cirrhosis vs other or comorbid hepatic abnormality such as WIlson's Active Problems:   Alcoholic cirrhosis (HCC)   Alcohol dependence with withdrawal (HCC)   Hypokalemia   GERD (gastroesophageal reflux disease)   Obesity (BMI 30-39.9)   Elevated  bilirubin   Generalized abdominal pain   Ascites and abdominal distension d/t alcoholic hepatitis/cirrhosis vs other or comorbid hepatic abnormality such as WIlson's Alcoholic cirrhosis Concern for SBP Follow-up on ascites fluid analysis to evaluate for SBP - if present continue antibiotics for 5 days and reassess for clinical improvement. There was minimal fluid collected so analysis may be incomplete - continue empiric SBP tx  Serial abdominal exam - of change/worse obtain CT abdomen or preferably CT angiogram to rule out mesenteric ischemia  Abstain EtOH As an outpatient will require an EGD to screen for esophageal versus and full autoimmune and viral hepatitis workup to determine why he has cirrhosis at the age of 73 which could obviously be from alcohol but other conditions such as Wilson's disease to be ruled out With concern for SBP , steroids for alcoholic hepatitis is contraindicated  Correct electrolytes   Cardiomegaly Pulmonary vascular congestion  Albumin + Lasix Echo Strict I&O  Alcohol dependence with withdrawal  IV thiamine to prevent Wernicke's encephalopathy CIWA protocol   Hypokalemia Replace as needed Monitor BMP    DVT prophylaxis: SCD Pertinent IV fluids/nutrition: no continuous IF fluids  Central lines / invasive devices: none   Code Status: FULL CODE  Current Admission Status: inpatient  TOC needs / Dispo plan: none at this time Barriers to discharge / significant pending items: clinical improvement, echo results, GI clearance prior to d/c              Subjective / Brief ROS:  Patient reports abdominal pain a bit better, doesn't feel any more distended today, reports wet-sounding cough but no SOB  Denies new weakness.  OK with advanced diet  Reports no concerns w/ urination/defecation.  Family Communication: support person at bedside on rounds     Objective Findings:  Vitals:   05/15/22 0512 05/15/22 0855 05/15/22 0858 05/15/22  1249  BP: 131/64 (!) 147/63  124/66  Pulse: 93 91  86  Resp: 18 (!) 30 18 20   Temp: 99.5 F (37.5 C) 98.4 F (36.9 C)  98.6 F (37 C)  TempSrc:  Oral  Oral  SpO2: 90% 92%  92%  Weight: 112.6 kg     Height:        Intake/Output Summary (Last 24 hours) at 05/15/2022 1300 Last data filed at 05/15/2022 1105 Gross per 24 hour  Intake 640.03 ml  Output --  Net 640.03 ml    Filed Weights   05/11/22 0935 05/15/22 0512  Weight: 122.9 kg 112.6 kg    Examination:  Physical Exam Constitutional:      General: He is not in acute distress.    Appearance: He is obese. He is ill-appearing. He is not toxic-appearing.  Eyes:     General: Scleral icterus present.     Extraocular Movements: Extraocular movements intact.  Cardiovascular:     Rate and Rhythm: Normal rate and regular rhythm.  Pulmonary:     Effort: Pulmonary effort is normal.     Breath sounds: Rales present.  Abdominal:     General: Abdomen is protuberant. Bowel sounds are normal. There is no distension.     Palpations: Abdomen is soft.     Tenderness: There is abdominal tenderness in the epigastric area.  Skin:    General: Skin is warm and dry.     Coloration: Skin is jaundiced.  Neurological:     General: No focal deficit present.     Mental Status: He is alert.  Psychiatric:        Behavior: Behavior normal.          Scheduled Medications:   amLODipine  5 mg Oral Daily   docusate sodium  100 mg Oral BID   enoxaparin (LOVENOX) injection  0.5 mg/kg Subcutaneous [id number]   folic acid  1 mg Oral Daily   furosemide  40 mg Intravenous Once   lactulose  15 g Oral BID   multivitamin with minerals  1 tablet Oral Daily   phytonadione  10 mg Subcutaneous Daily   senna  1 tablet Oral BID   spironolactone  50 mg Oral Daily   thiamine  100 mg Oral Daily   Or   thiamine  100 mg Intravenous Daily    Continuous Infusions:  albumin human     cefTRIAXone (ROCEPHIN)  IV 2 g (05/15/22 1108)   metronidazole Stopped  (05/15/22 1105)   potassium chloride 10 mEq (05/15/22 1252)    PRN Medications:  morphine injection, ondansetron **OR** ondansetron (ZOFRAN) IV, mouth rinse, oxyCODONE  Antimicrobials from admission:  Anti-infectives (From admission, onward)    Start     Dose/Rate Route Frequency Ordered Stop   05/12/22 1100  cefTRIAXone (ROCEPHIN) 2 g in sodium chloride 0.9 % 100 mL IVPB        2 g 200 mL/hr over 30 Minutes Intravenous Every 24 hours 05/11/22 1446     05/11/22 2200  metroNIDAZOLE (FLAGYL) IVPB 500 mg  Status:  Discontinued        500 mg 100 mL/hr over 60 Minutes Intravenous Every 8 hours 05/11/22 1446 05/11/22 1454   05/11/22 2200  metroNIDAZOLE (FLAGYL) IVPB 500 mg        500 mg 100 mL/hr over 60 Minutes  Intravenous Every 12 hours 05/11/22 1454     05/11/22 1500  cefTRIAXone (ROCEPHIN) 1 g in sodium chloride 0.9 % 100 mL IVPB  Status:  Discontinued        1 g 200 mL/hr over 30 Minutes Intravenous  Once 05/11/22 1449 05/12/22 1607   05/11/22 1430  metroNIDAZOLE (FLAGYL) IVPB 500 mg        500 mg 100 mL/hr over 60 Minutes Intravenous  Once 05/11/22 1421 05/11/22 1530   05/11/22 1030  cefTRIAXone (ROCEPHIN) 1 g in sodium chloride 0.9 % 100 mL IVPB        1 g 200 mL/hr over 30 Minutes Intravenous  Once 05/11/22 1024 05/11/22 1220           Data Reviewed:  I have personally reviewed the following...  CBC: Recent Labs  Lab 05/11/22 0939 05/11/22 1525 05/12/22 0530 05/13/22 0543 05/14/22 0637  WBC 14.4* 13.5* 16.2* 15.7* 14.6*  HGB 10.9* 9.8* 9.5* 9.7* 10.1*  HCT 31.2* 27.9* 27.2* 27.5* 28.5*  MCV 104.7* 104.5* 106.3* 104.6* 106.3*  PLT 192 173 161 163 [id number]    Basic Metabolic Panel: Recent Labs  Lab 05/11/22 0939 05/11/22 1130 05/11/22 1525 05/12/22 0530 05/13/22 0543 05/13/22 1538 05/14/22 0637 05/15/22 0630  NA 131*  --   --  129* 128*  --  127* 130*  K 2.5*  --   --  3.1* 2.6* 3.2* 3.3* 3.2*  CL 90*  --   --  90* 90*  --  90* 86*  CO2 28  --   --  30  28  --  28 30  GLUCOSE 111*  --   --  96 95  --  94 100*  BUN <5*  --   --  7 13  --  14 11  CREATININE 0.33*  --  <0.30* 0.35* 0.64  --  0.60* 0.62  CALCIUM 7.9*  --   --  7.4* 7.3*  --  7.5* 7.8*  MG  --  1.8  --  1.5* 1.6*  --   --   --   PHOS  --   --   --  4.3 3.7  --   --   --     GFR: Estimated Creatinine Clearance: 180.1 mL/min (by C-G formula based on SCr of 0.62 mg/dL). Liver Function Tests: Recent Labs  Lab 05/11/22 0939 05/12/22 0530 05/13/22 0543 05/14/22 0637  AST 169* 154* 123* 116*  ALT 21 22 21 20   ALKPHOS 107 88 82 92  BILITOT 28.4* 26.4* 26.7* 27.5*  PROT 9.4* 8.2* 8.2* 8.3*  ALBUMIN 2.1* 1.8* 1.8* 2.0*    Recent Labs  Lab 05/11/22 0939  LIPASE 75*    No results for input(s): "AMMONIA" in the last 168 hours. Coagulation Profile: Recent Labs  Lab 05/12/22 1155 05/14/22 0637  INR 2.7* 3.2*    Cardiac Enzymes: No results for input(s): "CKTOTAL", "CKMB", "CKMBINDEX", "TROPONINI" in the last 168 hours. BNP (last 3 results) No results for input(s): "PROBNP" in the last 8760 hours. HbA1C: No results for input(s): "HGBA1C" in the last 72 hours. CBG: No results for input(s): "GLUCAP" in the last 168 hours. Lipid Profile: No results for input(s): "CHOL", "HDL", "LDLCALC", "TRIG", "CHOLHDL", "LDLDIRECT" in the last 72 hours. Thyroid Function Tests: No results for input(s): "TSH", "T4TOTAL", "FREET4", "T3FREE", "THYROIDAB" in the last 72 hours. Anemia Panel: No results for input(s): "VITAMINB12", "FOLATE", "FERRITIN", "TIBC", "IRON", "RETICCTPCT" in the last 72 hours. Most Recent Urinalysis On File:  Component Value Date/Time   COLORURINE AMBER (A) 05/11/2022 1447   APPEARANCEUR CLEAR (A) 05/11/2022 1447   LABSPEC 1.033 (H) 05/11/2022 1447   PHURINE 7.0 05/11/2022 1447   GLUCOSEU NEGATIVE 05/11/2022 1447   HGBUR SMALL (A) 05/11/2022 1447   BILIRUBINUR MODERATE (A) 05/11/2022 1447   KETONESUR NEGATIVE 05/11/2022 1447   PROTEINUR NEGATIVE  05/11/2022 1447   NITRITE NEGATIVE 05/11/2022 1447   LEUKOCYTESUR NEGATIVE 05/11/2022 1447   Sepsis Labs: @LABRCNTIP (procalcitonin:4,lacticidven:4) Microbiology: No results found for this or any previous visit (from the past 240 hour(s)).    Radiology Studies last 3 days: DG Chest Port 1 View  Result Date: 05/15/2022 CLINICAL DATA:  Cough EXAM: PORTABLE CHEST 1 VIEW COMPARISON:  11/21/2019 x-ray FINDINGS: Enlarged cardiopericardial silhouette. Central vascular congestion. No pneumothorax or effusion. There is some linear opacity left midlung likely scar or atelectasis. IMPRESSION: Underinflation with enlarged cardiopericardial silhouette with vascular congestion. Subtle left midlung opacity as well. Recommend follow-up Electronically Signed   By: Jill Side M.D.   On: 05/15/2022 12:30   US LIVER DOPPLER  Result Date: 05/14/2022 CLINICAL DATA:  Liver failure EXAM: DUPLEX ULTRASOUND OF LIVER TECHNIQUE: Color and duplex Doppler ultrasound was performed to evaluate the hepatic in-flow and out-flow vessels. COMPARISON:  None Available. FINDINGS: Liver: Enlarged liver with heterogeneous and echogenic hepatic parenchyma. No discrete hepatic lesions are identified. No focal lesion, mass or intrahepatic biliary ductal dilatation. Main Portal Vein size: 1.2 cm Portal Vein Velocities Main Prox:  23 cm/sec with sluggish bidirectional flow. Main Mid: 36 cm/sec with sluggish bidirectional flow. Main Dist:  13 cm/sec with sluggish bidirectional flow. Right: 21 cm/sec with hepatopetal flow. Left: 47 cm/sec Hepatic Vein Velocities Right:  22 cm/sec Middle:  29 cm/sec Left:  24 cm/sec IVC: Present and patent with normal respiratory phasicity. Hepatic Artery Velocity:  105 cm/sec Splenic Vein Velocity:  14 cm/sec Spleen: 21 cm x 8.6 cm x 19.8 cm with a total volume of 1,889 cm^3 (411 cm^3 is upper limit normal) Portal Vein Occlusion/Thrombus: No Splenic Vein Occlusion/Thrombus: No Ascites: Trace ascites. Varices:  Recanalized periumbilical vein visualized. IMPRESSION: 1. Hepatic cirrhosis with portal hypertension as evidence by sluggish bidirectional flow in the portal vein, recanalized periumbilical vein and significant splenomegaly. 2. Splenomegaly. 3. No discrete hepatic lesion. Signed, Criselda Peaches, MD, [facility] Radiology Electronically Signed   By: Jacqulynn Cadet M.D.   On: 05/14/2022 16:27   US Paracentesis  Result Date: 05/12/2022 INDICATION: Ascites, possible spontaneous bacterial peritonitis EXAM: ULTRASOUND GUIDED  PARACENTESIS MEDICATIONS: Lidocaine 1% subcutaneous COMPLICATIONS: None immediate. PROCEDURE: Informed written consent was obtained from the patient after a discussion of the risks, benefits and alternatives to treatment. A timeout was performed prior to the initiation of the procedure. Initial ultrasound scanning demonstrates a small amount of infrahepatic ascites within the right lower abdominal quadrant. The right lower abdomen was prepped and draped in the usual sterile fashion. 1% lidocaine was used for local anesthesia. Following this, under real-time ultrasound guidance a Safe-T-Centesis catheter was introduced. An ultrasound image was saved for documentation purposes. The paracentesis was performed. Only scant volume obtained. The catheter was removed and a dressing was applied. The patient tolerated the procedure well without immediate post procedural complication. FINDINGS: A total of less than 5 mL clear yellow fluid was removed. Samples were sent to the laboratory as requested by the clinical team. IMPRESSION: Successful ultrasound-guided paracentesis yielding less than 5 mL of peritoneal fluid. Electronically Signed   By: Keturah Barre  Vernard Gambles M.D.   On: 05/12/2022 15:11   US ABDOMEN LIMITED RUQ (LIVER/GB)  Result Date: 05/11/2022 CLINICAL DATA:  Abdominal pain EXAM: ULTRASOUND ABDOMEN LIMITED RIGHT UPPER QUADRANT COMPARISON:  No  prior right upper quadrant ultrasound available, correlation is made with 05/11/2022 CT abdomen pelvis FINDINGS: Gallbladder: Level sludge is noted within the gallbladder. No gallstones. The gallbladder is somewhat distended, with the wall measuring up to 4 mm, just above the upper limit of normal. No pericholecystic fluid. No sonographic Murphy sign noted by sonographer. Common bile duct: Diameter: 6 mm, within normal limits. No intrahepatic biliary ductal dilatation. Liver: No focal lesion identified. Increased parenchymal echogenicity, which limits sonographic penetration. Portal vein is patent on color Doppler imaging with likely reversal of blood flow, away from the liver. Other: None. IMPRESSION: 1. Gallbladder sludge and mild wall thickening, just above upper limit of normal. No pericholecystic fluid or sonographic Murphy sign. Findings are equivocal for acute cholecystitis and may be related to the patient's history of cirrhosis. 2. Portal vein is patent with likely reversal of blood flow. Electronically Signed   By: Merilyn Baba M.D.   On: 05/11/2022 14:17             LOS: 4 days       Emeterio Reeve, DO [facility] 05/15/2022, 1:00 PM    Dictation software may have been used to generate the above note. Typos may occur and escape review in typed/dictated notes. Please contact Dr Sheppard Coil directly for clarity if needed.  Staff may message me via secure chat in [place]  but this may not receive an immediate response,  please page me for urgent matters!  If 7PM-7AM, please contact night coverage [email or web address]

## 2022-05-15 NOTE — Progress Notes (Signed)
*  PRELIMINARY RESULTS* Echocardiogram 2D Echocardiogram has been performed.  Sherrie Sport 05/15/2022, 2:02 PM

## 2022-05-16 LAB — BODY FLUID CELL COUNT WITH DIFFERENTIAL
Eos, Fluid: 0 %
Lymphs, Fluid: 69 %
Monocyte-Macrophage-Serous Fluid: 25 %
Neutrophil Count, Fluid: 6 %
Total Nucleated Cell Count, Fluid: UNDETERMINED cu mm

## 2022-05-16 LAB — COMPREHENSIVE METABOLIC PANEL
ALT: 20 U/L (ref 0–44)
AST: 111 U/L — ABNORMAL HIGH (ref 15–41)
Albumin: 2.1 g/dL — ABNORMAL LOW (ref 3.5–5.0)
Alkaline Phosphatase: 93 U/L (ref 38–126)
Anion gap: 11 (ref 5–15)
BUN: 12 mg/dL (ref 6–20)
CO2: 27 mmol/L (ref 22–32)
Calcium: 7.7 mg/dL — ABNORMAL LOW (ref 8.9–10.3)
Chloride: 89 mmol/L — ABNORMAL LOW (ref 98–111)
Creatinine, Ser: 0.71 mg/dL (ref 0.61–1.24)
GFR, Estimated: >60 mL/min (ref >60)
Glucose, Bld: 146 mg/dL — ABNORMAL HIGH (ref 70–99)
Potassium: 3 mmol/L — ABNORMAL LOW (ref 3.5–5.1)
Sodium: 127 mmol/L — ABNORMAL LOW (ref 135–145)
Total Bilirubin: 29.3 mg/dL (ref 0.3–1.2)
Total Protein: 8.5 g/dL — ABNORMAL HIGH (ref 6.5–8.1)

## 2022-05-16 LAB — CBC
HCT: 30.2 % — ABNORMAL LOW (ref 39.0–52.0)
Hemoglobin: 10.5 g/dL — ABNORMAL LOW (ref 13.0–17.0)
MCH: 37.2 pg — ABNORMAL HIGH (ref 26.0–34.0)
MCHC: 34.8 g/dL (ref 30.0–36.0)
MCV: 107.1 fL — ABNORMAL HIGH (ref 80.0–100.0)
Platelets: 171 10*3/uL (ref 150–400)
RBC: 2.82 MIL/uL — ABNORMAL LOW (ref 4.22–5.81)
RDW: 19.7 % — ABNORMAL HIGH (ref 11.5–15.5)
WBC: 16 10*3/uL — ABNORMAL HIGH (ref 4.0–10.5)
nRBC: 0.2 % (ref 0.0–0.2)

## 2022-05-16 LAB — MAGNESIUM: Magnesium: 2 mg/dL (ref 1.7–2.4)

## 2022-05-16 LAB — VARICELLA-ZOSTER BY PCR: Varicella-Zoster, PCR: NEGATIVE

## 2022-05-16 LAB — CMV DNA, QUANTITATIVE, PCR
CMV DNA Quant: POSITIVE IU/mL
Log10 CMV Qn DNA Pl: UNDETERMINED log10 IU/mL

## 2022-05-16 MED ORDER — PREDNISONE 10 MG PO TABS: 40.0000 mg | ORAL_TABLET | Freq: Every day | ORAL | Status: DC

## 2022-05-16 MED ORDER — SODIUM CHLORIDE 0.9 % IV SOLN: INTRAVENOUS | Status: DC | PRN

## 2022-05-16 MED ORDER — SPIRONOLACTONE 25 MG PO TABS: 100.0000 mg | ORAL_TABLET | Freq: Every day | ORAL | Status: DC

## 2022-05-16 MED ORDER — FUROSEMIDE 10 MG/ML IJ SOLN
40.0000 mg | Freq: Once | INTRAMUSCULAR | Status: AC
  Administered 2022-05-16: 40 mg via INTRAVENOUS
  Filled 2022-05-16: qty 4

## 2022-05-16 MED ORDER — PREDNISOLONE SODIUM PHOSPHATE 15 MG/5ML PO SOLN
40.0000 mg | Freq: Every day | ORAL | Status: DC
  Administered 2022-05-16 – 2022-05-18 (×3): 40 mg via ORAL
  Filled 2022-05-16: qty 15
  Filled 2022-05-16 (×2): qty 13.33

## 2022-05-16 MED ORDER — CALCIUM GLUCONATE-NACL 2-0.675 GM/100ML-% IV SOLN
2.0000 g | Freq: Once | INTRAVENOUS | Status: AC
  Administered 2022-05-16: 2000 mg via INTRAVENOUS
  Filled 2022-05-16 (×2): qty 100

## 2022-05-16 MED ORDER — POTASSIUM CHLORIDE 10 MEQ/100ML IV SOLN
10.0000 meq | INTRAVENOUS | Status: AC
  Administered 2022-05-16 – 2022-05-17 (×4): 10 meq via INTRAVENOUS
  Filled 2022-05-16 (×4): qty 100

## 2022-05-16 MED ORDER — CIPROFLOXACIN HCL 500 MG PO TABS: 500.0000 mg | ORAL_TABLET | Freq: Every day | ORAL | Status: DC

## 2022-05-16 MED ORDER — METRONIDAZOLE 500 MG PO TABS
500.0000 mg | ORAL_TABLET | Freq: Two times a day (BID) | ORAL | Status: DC
  Administered 2022-05-16 – 2022-05-17 (×3): 500 mg via ORAL
  Filled 2022-05-16 (×3): qty 1

## 2022-05-16 MED ORDER — SODIUM CHLORIDE 0.9 % IV SOLN
INTRAVENOUS | Status: DC | PRN
  Administered 2022-05-22: 10 mL/h via INTRAVENOUS

## 2022-05-16 NOTE — Progress Notes (Signed)
Cameron Lame, MD [facility]   [address], [place], [place] [address] Phone: [phone] Fax : [phone]   Subjective: Patient is sitting up in bed in no apparent distress.  The patient is eating and feels well today.  His MELD score is 35 with his bilirubin increasing.  The patient's INR is also increasing.  The patient has been drinking off and on since the age of 27 years old.   Objective: Vital signs in last 24 hours: Vitals:   05/16/22 0416 05/16/22 0417 05/16/22 0859 05/16/22 1122  BP:  (!) 141/72 128/65 (!) 121/59  Pulse:  91 91 90  Resp:  18 18 18   Temp:  99 F (37.2 C) 99 F (37.2 C) 99.5 F (37.5 C)  TempSrc:      SpO2:  91% 92% 98%  Weight: 116.4 kg     Height:       Weight change: 3.8 kg  Intake/Output Summary (Last 24 hours) at 05/16/2022 1512 Last data filed at 05/16/2022 1458 Gross per 24 hour  Intake 531.65 ml  Output 1025 ml  Net -493.35 ml     Exam: General: Jaundice in no apparent distress sitting up in bed   Lab Results: @LABTEST2 @ Micro Results: Recent Results (from the past 240 hour(s))  Varicella-zoster by PCR     Status: None   Collection Time: 05/14/22 11:40 AM   Specimen: Blood  Result Value Ref Range Status   Varicella-Zoster, PCR Negative Negative Final    Comment: (NOTE) No Varicella Zoster Virus DNA detected. This test was developed and its performance characteristics determined by [facility].  It has not been cleared or approved by the Food and Drug Administration.  The FDA has determined that such clearance or approval is not necessary. Performed At: [facility] [place], [place] [id number] Rush Farmer MD [id number]    Studies/Results: ECHOCARDIOGRAM COMPLETE  Result Date: 05/15/2022    ECHOCARDIOGRAM REPORT   Patient Name:   Cameron Santiago Date of Exam: 05/15/2022 Medical Rec #:  [id number]          Height:       71.0 in Accession #:    [id number]         Weight:       248.2 lb Date  of Birth:  19-Sep-1995           BSA:          2.311 m Patient Age:    26 years           BP:           124/66 mmHg Patient Gender: M                  HR:           86 bpm. Exam Location:  [facility] Procedure: 2D Echo, Cardiac Doppler and Color Doppler Indications:     Cardiomegaly I51.7  History:         Patient has no prior history of Echocardiogram examinations.                  Alcohol abuse, GERD.  Sonographer:     Sherrie Sport Referring Phys:  [id number] Emeterio Reeve Diagnosing Phys: Ida Rogue MD IMPRESSIONS  1. Left ventricular ejection fraction, by estimation, is 60 to 65%. The left ventricle has normal function. The left ventricle has no regional wall motion abnormalities. Left ventricular diastolic parameters were normal.  2. Right ventricular systolic  function is normal. The right ventricular size is normal.  3. The mitral valve is normal in structure. Mild mitral valve regurgitation. No evidence of mitral stenosis.  4. The aortic valve is normal in structure. Aortic valve regurgitation is not visualized. No aortic stenosis is present.  5. The inferior vena cava is normal in size with greater than 50% respiratory variability, suggesting right atrial pressure of 3 mmHg. FINDINGS  Left Ventricle: Left ventricular ejection fraction, by estimation, is 60 to 65%. The left ventricle has normal function. The left ventricle has no regional wall motion abnormalities. The left ventricular internal cavity size was normal in size. There is  no left ventricular hypertrophy. Left ventricular diastolic parameters were normal. Right Ventricle: The right ventricular size is normal. No increase in right ventricular wall thickness. Right ventricular systolic function is normal. Left Atrium: Left atrial size was normal in size. Right Atrium: Right atrial size was normal in size. Pericardium: There is no evidence of pericardial effusion. Mitral Valve: The mitral valve is normal in structure. Mild mitral valve regurgitation. No  evidence of mitral valve stenosis. MV peak gradient, 8.8 mmHg. The mean mitral valve gradient is 3.0 mmHg. Tricuspid Valve: The tricuspid valve is normal in structure. Tricuspid valve regurgitation is mild . No evidence of tricuspid stenosis. Aortic Valve: The aortic valve is normal in structure. Aortic valve regurgitation is not visualized. No aortic stenosis is present. Aortic valve mean gradient measures 9.0 mmHg. Aortic valve peak gradient measures 13.7 mmHg. Aortic valve area, by VTI measures 2.54 cm. Pulmonic Valve: The pulmonic valve was normal in structure. Pulmonic valve regurgitation is not visualized. No evidence of pulmonic stenosis. Aorta: The aortic root is normal in size and structure. Venous: The inferior vena cava is normal in size with greater than 50% respiratory variability, suggesting right atrial pressure of 3 mmHg. IAS/Shunts: No atrial level shunt detected by color flow Doppler.  LEFT VENTRICLE PLAX 2D LVIDd:         5.70 cm   Diastology LVIDs:         3.50 cm   LV e' medial:    13.20 cm/s LV PW:         1.20 cm   LV E/e' medial:  9.8 LV IVS:        0.90 cm   LV e' lateral:   19.60 cm/s LVOT diam:     2.00 cm   LV E/e' lateral: 6.6 LV SV:         91 LV SV Index:   39 LVOT Area:     3.14 cm  RIGHT VENTRICLE RV Basal diam:  4.40 cm RV Mid diam:    4.50 cm LEFT ATRIUM             Index        RIGHT ATRIUM           Index LA diam:        3.90 cm 1.69 cm/m   RA Area:     12.80 cm LA Vol (A2C):   35.0 ml 15.14 ml/m  RA Volume:   28.90 ml  12.50 ml/m LA Vol (A4C):   51.6 ml 22.32 ml/m LA Biplane Vol: 47.1 ml 20.38 ml/m  AORTIC VALVE AV Area (Vmax):    2.39 cm AV Area (Vmean):   2.36 cm AV Area (VTI):     2.54 cm AV Vmax:           185.00 cm/s AV Vmean:  137.000 cm/s AV VTI:            0.359 m AV Peak Grad:      13.7 mmHg AV Mean Grad:      9.0 mmHg LVOT Vmax:         141.00 cm/s LVOT Vmean:        103.000 cm/s LVOT VTI:          0.290 m LVOT/AV VTI ratio: 0.81  AORTA Ao Root diam:  3.10 cm MITRAL VALVE                TRICUSPID VALVE MV Area (PHT): 4.08 cm     TR Peak grad:   34.6 mmHg MV Area VTI:   3.41 cm     TR Vmax:        294.00 cm/s MV Peak grad:  8.8 mmHg MV Mean grad:  3.0 mmHg     SHUNTS MV Vmax:       1.48 m/s     Systemic VTI:  0.29 m MV Vmean:      83.1 cm/s    Systemic Diam: 2.00 cm MV Decel Time: 186 msec MV E velocity: 130.00 cm/s MV A velocity: 66.60 cm/s MV E/A ratio:  1.95 Ida Rogue MD Electronically signed by Ida Rogue MD Signature Date/Time: 05/15/2022/3:27:32 PM    Final    DG Chest Port 1 View  Result Date: 05/15/2022 CLINICAL DATA:  Cough EXAM: PORTABLE CHEST 1 VIEW COMPARISON:  11/21/2019 x-ray FINDINGS: Enlarged cardiopericardial silhouette. Central vascular congestion. No pneumothorax or effusion. There is some linear opacity left midlung likely scar or atelectasis. IMPRESSION: Underinflation with enlarged cardiopericardial silhouette with vascular congestion. Subtle left midlung opacity as well. Recommend follow-up Electronically Signed   By: Jill Side M.D.   On: 05/15/2022 12:30   US LIVER DOPPLER  Result Date: 05/14/2022 CLINICAL DATA:  Liver failure EXAM: DUPLEX ULTRASOUND OF LIVER TECHNIQUE: Color and duplex Doppler ultrasound was performed to evaluate the hepatic in-flow and out-flow vessels. COMPARISON:  None Available. FINDINGS: Liver: Enlarged liver with heterogeneous and echogenic hepatic parenchyma. No discrete hepatic lesions are identified. No focal lesion, mass or intrahepatic biliary ductal dilatation. Main Portal Vein size: 1.2 cm Portal Vein Velocities Main Prox:  23 cm/sec with sluggish bidirectional flow. Main Mid: 36 cm/sec with sluggish bidirectional flow. Main Dist:  13 cm/sec with sluggish bidirectional flow. Right: 21 cm/sec with hepatopetal flow. Left: 47 cm/sec Hepatic Vein Velocities Right:  22 cm/sec Middle:  29 cm/sec Left:  24 cm/sec IVC: Present and patent with normal respiratory phasicity. Hepatic Artery Velocity:   105 cm/sec Splenic Vein Velocity:  14 cm/sec Spleen: 21 cm x 8.6 cm x 19.8 cm with a total volume of 1,889 cm^3 (411 cm^3 is upper limit normal) Portal Vein Occlusion/Thrombus: No Splenic Vein Occlusion/Thrombus: No Ascites: Trace ascites. Varices: Recanalized periumbilical vein visualized. IMPRESSION: 1. Hepatic cirrhosis with portal hypertension as evidence by sluggish bidirectional flow in the portal vein, recanalized periumbilical vein and significant splenomegaly. 2. Splenomegaly. 3. No discrete hepatic lesion. Signed, Criselda Peaches, MD, [facility] Electronically Signed   By: Jacqulynn Cadet M.D.   On: 05/14/2022 16:27   Medications: I have reviewed the patient's current medications. Scheduled Meds:  amLODipine  5 mg Oral Daily   [START ON 05/18/2022] ciprofloxacin  500 mg Oral Q breakfast   docusate sodium  100 mg Oral BID   enoxaparin (LOVENOX) injection  0.5 mg/kg Subcutaneous Q24H  folic acid  1 mg Oral Daily   lactulose  15 g Oral BID   metroNIDAZOLE  500 mg Oral Q12H   multivitamin with minerals  1 tablet Oral Daily   senna  1 tablet Oral BID   [START ON 05/17/2022] spironolactone  100 mg Oral Daily   thiamine  100 mg Oral Daily   Or   thiamine  100 mg Intravenous Daily   Continuous Infusions:  sodium chloride Stopped (05/16/22 1305)   sodium chloride Stopped (05/16/22 1453)   cefTRIAXone (ROCEPHIN)  IV Stopped (05/15/22 1138)   potassium chloride 30 mL/hr at 05/16/22 1458   PRN Meds:.sodium chloride, sodium chloride, morphine injection, ondansetron **OR** ondansetron (ZOFRAN) IV, mouth rinse, oxyCODONE   Assessment: Principal Problem:   Alcoholic hepatitis with ascites Active Problems:   Alcohol dependence with withdrawal (HCC)   GERD (gastroesophageal reflux disease)   Hypokalemia   Alcoholic cirrhosis (HCC)   Obesity (BMI 30-39.9)   Elevated bilirubin   Generalized abdominal  pain    Plan: This patient had a paracentesis that showed 90 neutrophils on cytology therefore no sign of SBP.  The patient continues to have deterioration of his labs.  I have spoken to the transplant coordinator and transplant hepatologist at [facility] today.  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  Since the patient has not been started on steroids since admission the patient will now be started on it now.   LOS: 5 days   Cameron Santiago 05/16/2022, 3:12 PM Pager [phone] 7am-5pm  Check AMION for 5pm -7am coverage and on weekends

## 2022-05-16 NOTE — Progress Notes (Signed)
PHARMACIST - PHYSICIAN COMMUNICATION  CONCERNING: Antibiotic IV to Oral Route Change Policy  RECOMMENDATION: This patient is receiving metronidazole by the intravenous route.  Based on criteria approved by the Pharmacy and Therapeutics Committee, the antibiotic(s) is/are being converted to the equivalent oral dose form(s).  DESCRIPTION: These criteria include: Patient being treated for a respiratory tract infection, urinary tract infection, cellulitis or clostridium difficile associated diarrhea if on metronidazole The patient is not neutropenic and does not exhibit a GI malabsorption state The patient is eating (either orally or via tube) and/or has been taking other orally administered medications for a least 24 hours The patient is improving clinically and has a Tmax < 100.5  If you have questions about this conversion, please contact the [facility] 05/16/22

## 2022-05-16 NOTE — Progress Notes (Addendum)
PROGRESS NOTE    Cameron Santiago   [id number] DOB: [birth year]  DOA: 05/11/2022 Date of Service: 05/16/22 PCP: Merryl Hacker, No     Brief Narrative / Hospital Course:  This 27 y.o. male with PMH significant for EtOH liver cirrhosis, ongoing EtOH use, Essential hypertension, presented in the ED with abdominal distention associated with abdominal pain for last few days.  Patient drinks  ETOH regularly, 1 pint of hard liquor every day.  Patient denies any nausea,  vomiting. Patient also reports subjective fever at night with chills, He denies any GI bleeding, dizziness, weakness, urinary symptoms, denies any recent travel or  URI symptoms.  03/18: ED workup: Sodium 131, potassium 2.5, anion gap 13, magnesium 1.8, alkaline phosphatase 107, albumin 2.1, lipase 75, AST 169, ALT 21, ALT 9.4, total bilirubin 28.4, total protein 9.4, WBC 14.4, hemoglobin 10.9, hematocrit 31.2, MCV 104.7, platelet 192, influenza negative, COVID-negative, RSV negative, UA: Ketones negative, LE negative, nitrate negative. CTA/P:  Hepatosplenomegaly, Hepatic cirrhosis, Distended gallbladder, Perigastric and perisplenic varices. Small ascites. Admitted for treatment and monitoring EtOH withdrawal  03/19:  GI saw patient. NPO 03/20: Advance to CLD. Pt is concerned about EtOH w/drawal, remains on CIWA protocol  03/21: liver doppler (+)cirrhosis and portal HTN, advancing to full liquids.  03/22: advancing diet. (+)cough and rales, CXR showing cardiomegaly and vascular congestion --> lasix + albumin, Echo to eval further.  03/23: no concerns on Echocardiogram w/ EF 60-65% no RWMA and no diastolic df, diuresed well yesterday will repeat Lasix today. Dr Allen Norris w/ GI checked up on previous paracentesis labs and no SBP, ok to start steroids, he will be placing these orders. He also spoke w/ transplant team - given previous pancreatitis treatment, patient is not a transplant candidate.   Consultants:  General surgery  Gastroenterology    Procedures: 05/11/22 Paracentesis       ASSESSMENT & PLAN:   Principal Problem:   Ascites d/e alcoholic hepatitis/cirrhosis vs other or comorbid hepatic abnormality such as WIlson's Active Problems:   Alcoholic cirrhosis (HCC)   Alcohol dependence with withdrawal (HCC)   Hypokalemia   GERD (gastroesophageal reflux disease)   Obesity (BMI 30-39.9)   Elevated bilirubin   Generalized abdominal pain   Ascites and abdominal distension d/t alcoholic hepatitis/cirrhosis +/- comorbid hepatic abnormality such as Wilson's Alcoholic cirrhosis Portal HTN Perigastric and perisplenic varices Concern for SBP GI following  Serial abdominal exam - of change/worse obtain CT abdomen or preferably CT angiogram to rule out mesenteric ischemia  Abstain EtOH As an outpatient will require an EGD to screen for esophageal versus and full autoimmune and viral hepatitis workup to determine why he has cirrhosis at the age of 74 which could obviously be from alcohol but other conditions such as Wilson's disease to be ruled out With concern for SBP, steroids for alcoholic hepatitis have not been started but will be imitated today   Correct electrolytes as needed  IV lasix yesterday and today increasing spironolactone to 100 mg daily  Cardiomegaly Pulmonary vascular congestion  Echocardiogram w/ EF 60-65% no RWMA and no diastolic df, Albumin + Lasix as needed for ascites  Strict I&O  Alcohol dependence with withdrawal  IV thiamine to prevent Wernicke's encephalopathy CIWA protocol   Hypokalemia Monitor closely on Lasix  Replace as needed Monitor BMP  Hypocalcemia Replace as needed Monitor BMP    DVT prophylaxis: SCD Pertinent IV fluids/nutrition: no continuous IF fluids  Central lines / invasive devices: none   Code Status: FULL CODE  Current  Admission Status: inpatient  TOC needs / Dispo plan: none at this time Barriers to discharge / significant pending items: clinical  improvement, echo results, GI clearance prior to d/c              Subjective / Brief ROS:  Patient has no concerns today   Family Communication: support person at bedside on rounds     Objective Findings:  Vitals:   05/16/22 0416 05/16/22 0417 05/16/22 0859 05/16/22 1122  BP:  (!) 141/72 128/65 (!) 121/59  Pulse:  91 91 90  Resp:  18 18 18   Temp:  99 F (37.2 C) 99 F (37.2 C) 99.5 F (37.5 C)  TempSrc:      SpO2:  91% 92% 98%  Weight: 116.4 kg     Height:        Intake/Output Summary (Last 24 hours) at 05/16/2022 1504 Last data filed at 05/16/2022 1458 Gross per 24 hour  Intake 531.65 ml  Output 1025 ml  Net -493.35 ml   Filed Weights   05/11/22 0935 05/15/22 0512 05/16/22 0416  Weight: 122.9 kg 112.6 kg 116.4 kg    Examination:  Physical Exam Constitutional:      General: He is not in acute distress.    Appearance: He is obese. He is ill-appearing. He is not toxic-appearing.  Cardiovascular:     Rate and Rhythm: Normal rate and regular rhythm.  Pulmonary:     Effort: Pulmonary effort is normal.     Breath sounds: Normal breath sounds.  Abdominal:     General: Abdomen is protuberant. There is no distension.  Skin:    General: Skin is warm and dry.     Coloration: Skin is jaundiced.          Scheduled Medications:   amLODipine  5 mg Oral Daily   [START ON 05/18/2022] ciprofloxacin  500 mg Oral Q breakfast   docusate sodium  100 mg Oral BID   enoxaparin (LOVENOX) injection  0.5 mg/kg Subcutaneous [id number]   folic acid  1 mg Oral Daily   lactulose  15 g Oral BID   metroNIDAZOLE  500 mg Oral Q12H   multivitamin with minerals  1 tablet Oral Daily   senna  1 tablet Oral BID   [START ON 05/17/2022] spironolactone  100 mg Oral Daily   thiamine  100 mg Oral Daily   Or   thiamine  100 mg Intravenous Daily    Continuous Infusions:  sodium chloride Stopped (05/16/22 1305)   sodium chloride Stopped (05/16/22 1453)   cefTRIAXone (ROCEPHIN)  IV  Stopped (05/15/22 1138)   potassium chloride 30 mL/hr at 05/16/22 1458    PRN Medications:  sodium chloride, sodium chloride, morphine injection, ondansetron **OR** ondansetron (ZOFRAN) IV, mouth rinse, oxyCODONE  Antimicrobials from admission:  Anti-infectives (From admission, onward)    Start     Dose/Rate Route Frequency Ordered Stop   05/18/22 0800  ciprofloxacin (CIPRO) tablet 500 mg        500 mg Oral Daily with breakfast 05/16/22 0749     05/16/22 1000  metroNIDAZOLE (FLAGYL) tablet 500 mg        500 mg Oral Every 12 hours 05/16/22 0716 05/17/22 2359   05/12/22 1100  cefTRIAXone (ROCEPHIN) 2 g in sodium chloride 0.9 % 100 mL IVPB        2 g 200 mL/hr over 30 Minutes Intravenous Every 24 hours 05/11/22 1446 05/17/22 2359   05/11/22 2200  metroNIDAZOLE (FLAGYL) IVPB 500 mg  Status:  Discontinued        500 mg 100 mL/hr over 60 Minutes Intravenous Every 8 hours 05/11/22 1446 05/11/22 1454   05/11/22 2200  metroNIDAZOLE (FLAGYL) IVPB 500 mg  Status:  Discontinued        500 mg 100 mL/hr over 60 Minutes Intravenous Every 12 hours 05/11/22 1454 05/16/22 0716   05/11/22 1500  cefTRIAXone (ROCEPHIN) 1 g in sodium chloride 0.9 % 100 mL IVPB  Status:  Discontinued        1 g 200 mL/hr over 30 Minutes Intravenous  Once 05/11/22 1449 05/12/22 1607   05/11/22 1430  metroNIDAZOLE (FLAGYL) IVPB 500 mg        500 mg 100 mL/hr over 60 Minutes Intravenous  Once 05/11/22 1421 05/11/22 1530   05/11/22 1030  cefTRIAXone (ROCEPHIN) 1 g in sodium chloride 0.9 % 100 mL IVPB        1 g 200 mL/hr over 30 Minutes Intravenous  Once 05/11/22 1024 05/11/22 1220           Data Reviewed:  I have personally reviewed the following...  CBC: Recent Labs  Lab 05/11/22 1525 05/12/22 0530 05/13/22 0543 05/14/22 0637 05/16/22 1031  WBC 13.5* 16.2* 15.7* 14.6* 16.0*  HGB 9.8* 9.5* 9.7* 10.1* 10.5*  HCT 27.9* 27.2* 27.5* 28.5* 30.2*  MCV 104.5* 106.3* 104.6* 106.3* 107.1*  PLT 173 161 163 156  [id number]   Basic Metabolic Panel: Recent Labs  Lab 05/11/22 1130 05/11/22 1525 05/12/22 0530 05/13/22 0543 05/13/22 1538 05/14/22 0637 05/15/22 0630 05/16/22 1031  NA  --   --  129* 128*  --  127* 130* 127*  K  --   --  3.1* 2.6* 3.2* 3.3* 3.2* 3.0*  CL  --   --  90* 90*  --  90* 86* 89*  CO2  --   --  30 28  --  28 30 27   GLUCOSE  --   --  96 95  --  94 100* 146*  BUN  --   --  7 13  --  14 11 12   CREATININE  --    < > 0.35* 0.64  --  0.60* 0.62 0.71  CALCIUM  --   --  7.4* 7.3*  --  7.5* 7.8* 7.7*  MG 1.8  --  1.5* 1.6*  --   --   --  2.0  PHOS  --   --  4.3 3.7  --   --   --   --    < > = values in this interval not displayed.   GFR: Estimated Creatinine Clearance: 183.1 mL/min (by C-G formula based on SCr of 0.71 mg/dL). Liver Function Tests: Recent Labs  Lab 05/11/22 0939 05/12/22 0530 05/13/22 0543 05/14/22 0637 05/16/22 1031  AST 169* 154* 123* 116* 111*  ALT 21 22 21 20 20   ALKPHOS 107 88 82 92 93  BILITOT 28.4* 26.4* 26.7* 27.5* 29.3*  PROT 9.4* 8.2* 8.2* 8.3* 8.5*  ALBUMIN 2.1* 1.8* 1.8* 2.0* 2.1*   Recent Labs  Lab 05/11/22 0939  LIPASE 75*   No results for input(s): "AMMONIA" in the last 168 hours. Coagulation Profile: Recent Labs  Lab 05/12/22 1155 05/14/22 0637  INR 2.7* 3.2*   Cardiac Enzymes: No results for input(s): "CKTOTAL", "CKMB", "CKMBINDEX", "TROPONINI" in the last 168 hours. BNP (last 3 results) No results for input(s): "PROBNP" in the last 8760 hours. HbA1C: No results for input(s): "HGBA1C" in the last 72  hours. CBG: No results for input(s): "GLUCAP" in the last 168 hours. Lipid Profile: No results for input(s): "CHOL", "HDL", "LDLCALC", "TRIG", "CHOLHDL", "LDLDIRECT" in the last 72 hours. Thyroid Function Tests: No results for input(s): "TSH", "T4TOTAL", "FREET4", "T3FREE", "THYROIDAB" in the last 72 hours. Anemia Panel: No results for input(s): "VITAMINB12", "FOLATE", "FERRITIN", "TIBC", "IRON", "RETICCTPCT" in the last 72  hours. Most Recent Urinalysis On File:     Component Value Date/Time   COLORURINE AMBER (A) 05/11/2022 1447   APPEARANCEUR CLEAR (A) 05/11/2022 1447   LABSPEC 1.033 (H) 05/11/2022 1447   PHURINE 7.0 05/11/2022 1447   GLUCOSEU NEGATIVE 05/11/2022 1447   HGBUR SMALL (A) 05/11/2022 1447   BILIRUBINUR MODERATE (A) 05/11/2022 1447   KETONESUR NEGATIVE 05/11/2022 1447   PROTEINUR NEGATIVE 05/11/2022 1447   NITRITE NEGATIVE 05/11/2022 1447   LEUKOCYTESUR NEGATIVE 05/11/2022 1447   Sepsis Labs: @LABRCNTIP (procalcitonin:4,lacticidven:4) Microbiology: Recent Results (from the past 240 hour(s))  Varicella-zoster by PCR     Status: None   Collection Time: 05/14/22 11:40 AM   Specimen: Blood  Result Value Ref Range Status   Varicella-Zoster, PCR Negative Negative Final    Comment: (NOTE) No Varicella Zoster Virus DNA detected. This test was developed and its performance characteristics determined by [facility].  It has not been cleared or approved by the Food and Drug Administration.  The FDA has determined that such clearance or approval is not necessary. Performed At: [facility] [place], [place] [id number] Rush Farmer MD [id number]       Radiology Studies last 3 days: ECHOCARDIOGRAM COMPLETE  Result Date: 05/15/2022    ECHOCARDIOGRAM REPORT   Patient Name:   THEODORA REGIS Date of Exam: 05/15/2022 Medical Rec #:  [id number]          Height:       71.0 in Accession #:    [id number]         Weight:       248.2 lb Date of Birth:  1996-02-20           BSA:          2.311 m Patient Age:    26 years           BP:           124/66 mmHg Patient Gender: M                  HR:           86 bpm. Exam Location:  [facility] Procedure: 2D Echo, Cardiac Doppler and Color Doppler Indications:     Cardiomegaly I51.7  History:         Patient has no prior history of Echocardiogram examinations.                  Alcohol abuse, GERD.  Sonographer:     Sherrie Sport Referring Phys:   [id number] Emeterio Reeve Diagnosing Phys: Ida Rogue MD IMPRESSIONS  1. Left ventricular ejection fraction, by estimation, is 60 to 65%. The left ventricle has normal function. The left ventricle has no regional wall motion abnormalities. Left ventricular diastolic parameters were normal.  2. Right ventricular systolic function is normal. The right ventricular size is normal.  3. The mitral valve is normal in structure. Mild mitral valve regurgitation. No evidence of mitral stenosis.  4. The aortic valve is normal in structure. Aortic valve regurgitation is not visualized. No aortic stenosis is present.  5. The inferior vena cava is normal  in size with greater than 50% respiratory variability, suggesting right atrial pressure of 3 mmHg. FINDINGS  Left Ventricle: Left ventricular ejection fraction, by estimation, is 60 to 65%. The left ventricle has normal function. The left ventricle has no regional wall motion abnormalities. The left ventricular internal cavity size was normal in size. There is  no left ventricular hypertrophy. Left ventricular diastolic parameters were normal. Right Ventricle: The right ventricular size is normal. No increase in right ventricular wall thickness. Right ventricular systolic function is normal. Left Atrium: Left atrial size was normal in size. Right Atrium: Right atrial size was normal in size. Pericardium: There is no evidence of pericardial effusion. Mitral Valve: The mitral valve is normal in structure. Mild mitral valve regurgitation. No evidence of mitral valve stenosis. MV peak gradient, 8.8 mmHg. The mean mitral valve gradient is 3.0 mmHg. Tricuspid Valve: The tricuspid valve is normal in structure. Tricuspid valve regurgitation is mild . No evidence of tricuspid stenosis. Aortic Valve: The aortic valve is normal in structure. Aortic valve regurgitation is not visualized. No aortic stenosis is present. Aortic valve mean gradient measures 9.0 mmHg. Aortic valve peak  gradient measures 13.7 mmHg. Aortic valve area, by VTI measures 2.54 cm. Pulmonic Valve: The pulmonic valve was normal in structure. Pulmonic valve regurgitation is not visualized. No evidence of pulmonic stenosis. Aorta: The aortic root is normal in size and structure. Venous: The inferior vena cava is normal in size with greater than 50% respiratory variability, suggesting right atrial pressure of 3 mmHg. IAS/Shunts: No atrial level shunt detected by color flow Doppler.  LEFT VENTRICLE PLAX 2D LVIDd:         5.70 cm   Diastology LVIDs:         3.50 cm   LV e' medial:    13.20 cm/s LV PW:         1.20 cm   LV E/e' medial:  9.8 LV IVS:        0.90 cm   LV e' lateral:   19.60 cm/s LVOT diam:     2.00 cm   LV E/e' lateral: 6.6 LV SV:         91 LV SV Index:   39 LVOT Area:     3.14 cm  RIGHT VENTRICLE RV Basal diam:  4.40 cm RV Mid diam:    4.50 cm LEFT ATRIUM             Index        RIGHT ATRIUM           Index LA diam:        3.90 cm 1.69 cm/m   RA Area:     12.80 cm LA Vol (A2C):   35.0 ml 15.14 ml/m  RA Volume:   28.90 ml  12.50 ml/m LA Vol (A4C):   51.6 ml 22.32 ml/m LA Biplane Vol: 47.1 ml 20.38 ml/m  AORTIC VALVE AV Area (Vmax):    2.39 cm AV Area (Vmean):   2.36 cm AV Area (VTI):     2.54 cm AV Vmax:           185.00 cm/s AV Vmean:          137.000 cm/s AV VTI:            0.359 m AV Peak Grad:      13.7 mmHg AV Mean Grad:      9.0 mmHg LVOT Vmax:         141.00 cm/s  LVOT Vmean:        103.000 cm/s LVOT VTI:          0.290 m LVOT/AV VTI ratio: 0.81  AORTA Ao Root diam: 3.10 cm MITRAL VALVE                TRICUSPID VALVE MV Area (PHT): 4.08 cm     TR Peak grad:   34.6 mmHg MV Area VTI:   3.41 cm     TR Vmax:        294.00 cm/s MV Peak grad:  8.8 mmHg MV Mean grad:  3.0 mmHg     SHUNTS MV Vmax:       1.48 m/s     Systemic VTI:  0.29 m MV Vmean:      83.1 cm/s    Systemic Diam: 2.00 cm MV Decel Time: 186 msec MV E velocity: 130.00 cm/s MV A velocity: 66.60 cm/s MV E/A ratio:  1.95 Ida Rogue MD  Electronically signed by Ida Rogue MD Signature Date/Time: 05/15/2022/3:27:32 PM    Final    DG Chest Port 1 View  Result Date: 05/15/2022 CLINICAL DATA:  Cough EXAM: PORTABLE CHEST 1 VIEW COMPARISON:  11/21/2019 x-ray FINDINGS: Enlarged cardiopericardial silhouette. Central vascular congestion. No pneumothorax or effusion. There is some linear opacity left midlung likely scar or atelectasis. IMPRESSION: Underinflation with enlarged cardiopericardial silhouette with vascular congestion. Subtle left midlung opacity as well. Recommend follow-up Electronically Signed   By: Jill Side M.D.   On: 05/15/2022 12:30   US LIVER DOPPLER  Result Date: 05/14/2022 CLINICAL DATA:  Liver failure EXAM: DUPLEX ULTRASOUND OF LIVER TECHNIQUE: Color and duplex Doppler ultrasound was performed to evaluate the hepatic in-flow and out-flow vessels. COMPARISON:  None Available. FINDINGS: Liver: Enlarged liver with heterogeneous and echogenic hepatic parenchyma. No discrete hepatic lesions are identified. No focal lesion, mass or intrahepatic biliary ductal dilatation. Main Portal Vein size: 1.2 cm Portal Vein Velocities Main Prox:  23 cm/sec with sluggish bidirectional flow. Main Mid: 36 cm/sec with sluggish bidirectional flow. Main Dist:  13 cm/sec with sluggish bidirectional flow. Right: 21 cm/sec with hepatopetal flow. Left: 47 cm/sec Hepatic Vein Velocities Right:  22 cm/sec Middle:  29 cm/sec Left:  24 cm/sec IVC: Present and patent with normal respiratory phasicity. Hepatic Artery Velocity:  105 cm/sec Splenic Vein Velocity:  14 cm/sec Spleen: 21 cm x 8.6 cm x 19.8 cm with a total volume of 1,889 cm^3 (411 cm^3 is upper limit normal) Portal Vein Occlusion/Thrombus: No Splenic Vein Occlusion/Thrombus: No Ascites: Trace ascites. Varices: Recanalized periumbilical vein visualized. IMPRESSION: 1. Hepatic cirrhosis with portal hypertension as evidence by sluggish bidirectional flow in the portal vein, recanalized  periumbilical vein and significant splenomegaly. 2. Splenomegaly. 3. No discrete hepatic lesion. Signed, Criselda Peaches, MD, [facility] Radiology Electronically Signed   By: Jacqulynn Cadet M.D.   On: 05/14/2022 16:27             LOS: 5 days       Emeterio Reeve, DO [facility] 05/16/2022, 3:04 PM    Dictation software may have been used to generate the above note. Typos may occur and escape review in typed/dictated notes. Please contact Dr Sheppard Coil directly for clarity if needed.  Staff may message me via secure chat in [place]  but this may not receive an immediate response,  please page me for urgent matters!  If 7PM-7AM, please contact night coverage [email or web address]

## 2022-05-16 NOTE — TOC Progression Note (Signed)
Transition of Care [facility]) - Progression Note    Patient Details  Name: Cameron Santiago MRN: [id number] Date of Birth: [birth year]  Transition of Care [facility]) CM/SW Contact  Izola Price, RN Phone Number: 05/16/2022, 2:06 PM  Clinical Narrative: 05/16/22:  Substance abuse resources and educational material placed in AVS via CM patient instructions per [facility] consult needs.          Expected Discharge Plan and Services                                               Social Determinants of Health (SDOH) Interventions SDOH Screenings   Food Insecurity: No Food Insecurity (05/11/2022)  Housing: Low Risk  (05/11/2022)  Transportation Needs: Unmet Transportation Needs (05/11/2022)  Utilities: Not At Risk (05/11/2022)  Tobacco Use: Low Risk  (05/11/2022)    Readmission Risk Interventions     No data to display

## 2022-05-16 NOTE — Discharge Instructions (Addendum)
Intensive Outpatient Programs   [facility] [address] [place],  [place], [place] (Inpatient and outpatient)[phone] (Suboxone and Methadone) [address] [phone]  [facility]: [facility] Programs - Intensive Outpatient [place] [phone] [place], [place], [place] (Outpatient, Inpatient, [facility] (Groups and [place]) (insurance only) [place], [place]   [facility] (for caregivers and family) [address], [place], [place]  Residential Treatment Programs  [facility](2 years) Residential: 74 days)[facility] ([place], [place], [place] or [phone]  [facility] Hanna [place], [place], [place]  [place] (RTS) [place], [place], [place] Admissions: 8am-3pm M-F  [facility] Program: Residential Program [phone] Days)             [facility]: [facility]  [place], [place], [place] or [phone] in Hours over the weekend or by referral)   Mobile Crisis: [facility]:[phone] (for crisis response 24 hours a day)

## 2022-05-17 LAB — HEPATIC FUNCTION PANEL
ALT: 22 U/L (ref 0–44)
AST: 89 U/L — ABNORMAL HIGH (ref 15–41)
Albumin: 1.9 g/dL — ABNORMAL LOW (ref 3.5–5.0)
Alkaline Phosphatase: 86 U/L (ref 38–126)
Bilirubin, Direct: 15 mg/dL — ABNORMAL HIGH (ref 0.0–0.2)
Indirect Bilirubin: 11.1 mg/dL — ABNORMAL HIGH (ref 0.3–0.9)
Total Bilirubin: 26.1 mg/dL (ref 0.3–1.2)
Total Protein: 8 g/dL (ref 6.5–8.1)

## 2022-05-17 LAB — BASIC METABOLIC PANEL
Anion gap: 6 (ref 5–15)
BUN: 17 mg/dL (ref 6–20)
CO2: 27 mmol/L (ref 22–32)
Calcium: 7.8 mg/dL — ABNORMAL LOW (ref 8.9–10.3)
Chloride: 90 mmol/L — ABNORMAL LOW (ref 98–111)
Creatinine, Ser: 0.77 mg/dL (ref 0.61–1.24)
GFR, Estimated: >60 mL/min (ref >60)
Glucose, Bld: 137 mg/dL — ABNORMAL HIGH (ref 70–99)
Potassium: 3.7 mmol/L (ref 3.5–5.1)
Sodium: 123 mmol/L — ABNORMAL LOW (ref 135–145)

## 2022-05-17 LAB — HSV DNA BY PCR (REFERENCE LAB)
HSV 1 DNA: NEGATIVE
HSV 2 DNA: NEGATIVE

## 2022-05-17 MED ORDER — SPIRONOLACTONE 12.5 MG HALF TABLET
12.5000 mg | ORAL_TABLET | Freq: Every day | ORAL | Status: DC
  Administered 2022-05-17 – 2022-05-19 (×3): 12.5 mg via ORAL
  Filled 2022-05-17 (×5): qty 1

## 2022-05-17 NOTE — Progress Notes (Signed)
Cameron Lame, MD [facility]   [address], [place], [place] [address] Phone: [phone] Fax : [phone]   Subjective: The patient and his mother have been explained the MELD score and his increased risk of 44-month mortality.  I have also explained to them about my conversation with [facility] and how he is not a transplant candidate due to his recurrent pancreatitis showing that he has not had a will to quit drinking despite past medical sequela.  The patient denies any new issues.  He was started on steroids yesterday.   Objective: Vital signs in last 24 hours: Vitals:   05/16/22 1642 05/16/22 2124 05/17/22 0536 05/17/22 0846  BP: 133/69 113/63 126/69 126/67  Pulse: 90 83 80 79  Resp: 17 18 18 18   Temp: 99.1 F (37.3 C) 98.6 F (37 C) 98.3 F (36.8 C) 98.5 F (36.9 C)  TempSrc:      SpO2: 90% 93% 95% 95%  Weight:      Height:       Weight change:   Intake/Output Summary (Last 24 hours) at 05/17/2022 1212 Last data filed at 05/17/2022 1051 Gross per 24 hour  Intake 369.79 ml  Output --  Net 369.79 ml     Exam: General: Patient is jaundiced in no apparent distress laying comfortably in bed.   Lab Results: @LABTEST2 @ Micro Results: Recent Results (from the past 240 hour(s))  Varicella-zoster by PCR     Status: None   Collection Time: 05/14/22 11:40 AM   Specimen: Blood  Result Value Ref Range Status   Varicella-Zoster, PCR Negative Negative Final    Comment: (NOTE) No Varicella Zoster Virus DNA detected. This test was developed and its performance characteristics determined by [facility].  It has not been cleared or approved by the Food and Drug Administration.  The FDA has determined that such clearance or approval is not necessary. Performed At: [facility] [place], [place] [id number] Rush Farmer MD [id number]    Studies/Results: ECHOCARDIOGRAM COMPLETE  Result Date: 05/15/2022    ECHOCARDIOGRAM REPORT   Patient Name:    Cameron Santiago Date of Exam: 05/15/2022 Medical Rec #:  [id number]          Height:       71.0 in Accession #:    [id number]         Weight:       248.2 lb Date of Birth:  [birth year]           BSA:          2.311 m Patient Age:    26 years           BP:           124/66 mmHg Patient Gender: M                  HR:           86 bpm. Exam Location:  [facility] Procedure: 2D Echo, Cardiac Doppler and Color Doppler Indications:     Cardiomegaly I51.7  History:         Patient has no prior history of Echocardiogram examinations.                  Alcohol abuse, GERD.  Sonographer:     Sherrie Sport Referring Phys:  [id number] Emeterio Reeve Diagnosing Phys: Ida Rogue MD IMPRESSIONS  1. Left ventricular ejection fraction, by estimation, is 60 to 65%. The left ventricle has normal function.  The left ventricle has no regional wall motion abnormalities. Left ventricular diastolic parameters were normal.  2. Right ventricular systolic function is normal. The right ventricular size is normal.  3. The mitral valve is normal in structure. Mild mitral valve regurgitation. No evidence of mitral stenosis.  4. The aortic valve is normal in structure. Aortic valve regurgitation is not visualized. No aortic stenosis is present.  5. The inferior vena cava is normal in size with greater than 50% respiratory variability, suggesting right atrial pressure of 3 mmHg. FINDINGS  Left Ventricle: Left ventricular ejection fraction, by estimation, is 60 to 65%. The left ventricle has normal function. The left ventricle has no regional wall motion abnormalities. The left ventricular internal cavity size was normal in size. There is  no left ventricular hypertrophy. Left ventricular diastolic parameters were normal. Right Ventricle: The right ventricular size is normal. No increase in right ventricular wall thickness. Right ventricular systolic function is normal. Left Atrium: Left atrial size was normal in size. Right Atrium: Right atrial size was  normal in size. Pericardium: There is no evidence of pericardial effusion. Mitral Valve: The mitral valve is normal in structure. Mild mitral valve regurgitation. No evidence of mitral valve stenosis. MV peak gradient, 8.8 mmHg. The mean mitral valve gradient is 3.0 mmHg. Tricuspid Valve: The tricuspid valve is normal in structure. Tricuspid valve regurgitation is mild . No evidence of tricuspid stenosis. Aortic Valve: The aortic valve is normal in structure. Aortic valve regurgitation is not visualized. No aortic stenosis is present. Aortic valve mean gradient measures 9.0 mmHg. Aortic valve peak gradient measures 13.7 mmHg. Aortic valve area, by VTI measures 2.54 cm. Pulmonic Valve: The pulmonic valve was normal in structure. Pulmonic valve regurgitation is not visualized. No evidence of pulmonic stenosis. Aorta: The aortic root is normal in size and structure. Venous: The inferior vena cava is normal in size with greater than 50% respiratory variability, suggesting right atrial pressure of 3 mmHg. IAS/Shunts: No atrial level shunt detected by color flow Doppler.  LEFT VENTRICLE PLAX 2D LVIDd:         5.70 cm   Diastology LVIDs:         3.50 cm   LV e' medial:    13.20 cm/s LV PW:         1.20 cm   LV E/e' medial:  9.8 LV IVS:        0.90 cm   LV e' lateral:   19.60 cm/s LVOT diam:     2.00 cm   LV E/e' lateral: 6.6 LV SV:         91 LV SV Index:   39 LVOT Area:     3.14 cm  RIGHT VENTRICLE RV Basal diam:  4.40 cm RV Mid diam:    4.50 cm LEFT ATRIUM             Index        RIGHT ATRIUM           Index LA diam:        3.90 cm 1.69 cm/m   RA Area:     12.80 cm LA Vol (A2C):   35.0 ml 15.14 ml/m  RA Volume:   28.90 ml  12.50 ml/m LA Vol (A4C):   51.6 ml 22.32 ml/m LA Biplane Vol: 47.1 ml 20.38 ml/m  AORTIC VALVE AV Area (Vmax):    2.39 cm AV Area (Vmean):   2.36 cm AV Area (VTI):     2.54  cm AV Vmax:           185.00 cm/s AV Vmean:          137.000 cm/s AV VTI:            0.359 m AV Peak Grad:      13.7  mmHg AV Mean Grad:      9.0 mmHg LVOT Vmax:         141.00 cm/s LVOT Vmean:        103.000 cm/s LVOT VTI:          0.290 m LVOT/AV VTI ratio: 0.81  AORTA Ao Root diam: 3.10 cm MITRAL VALVE                TRICUSPID VALVE MV Area (PHT): 4.08 cm     TR Peak grad:   34.6 mmHg MV Area VTI:   3.41 cm     TR Vmax:        294.00 cm/s MV Peak grad:  8.8 mmHg MV Mean grad:  3.0 mmHg     SHUNTS MV Vmax:       1.48 m/s     Systemic VTI:  0.29 m MV Vmean:      83.1 cm/s    Systemic Diam: 2.00 cm MV Decel Time: 186 msec MV E velocity: 130.00 cm/s MV A velocity: 66.60 cm/s MV E/A ratio:  1.95 Ida Rogue MD Electronically signed by Ida Rogue MD Signature Date/Time: 05/15/2022/3:27:32 PM    Final    Medications: I have reviewed the patient's current medications. Scheduled Meds:  amLODipine  5 mg Oral Daily   [START ON 05/18/2022] ciprofloxacin  500 mg Oral Q breakfast   docusate sodium  100 mg Oral BID   enoxaparin (LOVENOX) injection  0.5 mg/kg Subcutaneous [id number]   folic acid  1 mg Oral Daily   lactulose  15 g Oral BID   multivitamin with minerals  1 tablet Oral Daily   prednisoLONE  40 mg Oral QAC breakfast   senna  1 tablet Oral BID   spironolactone  12.5 mg Oral Daily   thiamine  100 mg Oral Daily   Or   thiamine  100 mg Intravenous Daily   Continuous Infusions:  sodium chloride Stopped (05/17/22 0357)   sodium chloride Stopped (05/17/22 1050)   PRN Meds:.sodium chloride, sodium chloride, morphine injection, ondansetron **OR** ondansetron (ZOFRAN) IV, mouth rinse, oxyCODONE   Assessment: Principal Problem:   Alcoholic hepatitis with ascites Active Problems:   Alcohol dependence with withdrawal (HCC)   GERD (gastroesophageal reflux disease)   Hypokalemia   Alcoholic cirrhosis (HCC)   Obesity (BMI 30-39.9)   Elevated bilirubin   Generalized abdominal pain    Plan: This patient has a finding of cirrhosis likely from alcohol.  The patient was started on steroids yesterday and his  bilirubin has come down slightly from 29.3 to 26.1.  The patient has been told the implications of his high MELD score and not being a candidate for a liver transplant.  I have discussed his antibiotics with Dr. Sheppard Coil and since his cytology and differential of his ascites did not show any sign of SBP, from a GI point of view he does not need to continue antibiotics at this time.   LOS: 6 days   Cameron Santiago, Marval Regal 05/17/2022, 12:12 PM Pager [phone] 7am-5pm  Check AMION for 5pm -7am coverage and on weekends

## 2022-05-17 NOTE — Progress Notes (Signed)
PROGRESS NOTE    Cameron Santiago   [id number] DOB: [birth year]  DOA: 05/11/2022 Date of Service: 05/17/22 PCP: Merryl Hacker, No     Brief Narrative / Hospital Course:  This 27 y.o. male with PMH significant for EtOH liver cirrhosis, ongoing EtOH use, Essential hypertension, presented in the ED with abdominal distention associated with abdominal pain for last few days.  Patient drinks  ETOH regularly, 1 pint of hard liquor every day.  Patient denies any nausea,  vomiting. Patient also reports subjective fever at night with chills, He denies any GI bleeding, dizziness, weakness, urinary symptoms, denies any recent travel or  URI symptoms.  03/18: ED workup: Sodium 131, potassium 2.5, anion gap 13, magnesium 1.8, alkaline phosphatase 107, albumin 2.1, lipase 75, AST 169, ALT 21, ALT 9.4, total bilirubin 28.4, total protein 9.4, WBC 14.4, hemoglobin 10.9, hematocrit 31.2, MCV 104.7, platelet 192, influenza negative, COVID-negative, RSV negative, UA: Ketones negative, LE negative, nitrate negative. CTA/P:  Hepatosplenomegaly, Hepatic cirrhosis, Distended gallbladder, Perigastric and perisplenic varices. Small ascites. Admitted for treatment and monitoring EtOH withdrawal  03/19:  GI saw patient. NPO 03/20: Advance to CLD. Pt is concerned about EtOH w/drawal, remains on CIWA protocol  03/21: liver doppler (+)cirrhosis and portal HTN, advancing to full liquids.  03/22: advancing diet. (+)cough and rales, CXR showing cardiomegaly and vascular congestion --> lasix + albumin, Echo to eval further.  03/23: no concerns on Echocardiogram w/ EF 60-65% no RWMA and no diastolic df, diuresed well yesterday will repeat Lasix today. Dr Allen Norris w/ GI checked up on previous paracentesis labs and no SBP, ok to start steroids, he will be placing these orders. Given deterioration on labs / increased MELD score, Dr Allen Norris also spoke w/ [facility] transplant team - given previous pancreatitis treatment, patient is not a transplant  candidate.  03/24: hyponatremia worse, reduced spironolactone. GI following. D/c abx. Bili slightly improved on steroids.    Consultants:  General surgery  Gastroenterology   Procedures: 05/11/22 Paracentesis       ASSESSMENT & PLAN:   Principal Problem:   Ascites d/e alcoholic hepatitis/cirrhosis vs other or comorbid hepatic abnormality such as WIlson's Active Problems:   Alcoholic cirrhosis (HCC)   Alcohol dependence with withdrawal (HCC)   Hypokalemia   GERD (gastroesophageal reflux disease)   Obesity (BMI 30-39.9)   Elevated bilirubin   Generalized abdominal pain   Liver failure  Ascites and abdominal distension d/t alcoholic hepatitis/cirrhosis +/- comorbid hepatic abnormality such as Wilson's Alcoholic cirrhosis Portal HTN Perigastric and perisplenic varices Concern for SBP - ruled out GI following  Serial abdominal exam - of change/worse obtain CT abdomen or preferably CT angiogram to rule out mesenteric ischemia  Abstain EtOH As an outpatient will require an EGD to screen for esophageal versus and full autoimmune and viral hepatitis workup to determine why he has cirrhosis at the age of 75 which could obviously be from alcohol but other conditions such as Wilson's disease to be ruled out With concern for SBP, steroids for alcoholic hepatitis had not been started but were initiated 03/23   Correct electrolytes as needed  IV lasix  Increased spironolactone to 100 mg yesterday but worsening hyponatremia, reduced dose today   Cardiomegaly Pulmonary vascular congestion  Echocardiogram w/ EF 60-65% no RWMA and no diastolic df, Albumin + Lasix as needed  Po lasix per GI recs  Strict I&O  Alcohol dependence with withdrawal  IV thiamine to prevent Wernicke's encephalopathy CIWA protocol   Hypokalemia - resolved  Monitor closely  on Lasix  Replace as needed Monitor BMP  Hypocalcemia Replace as needed Monitor BMP    DVT prophylaxis: SCD Pertinent IV  fluids/nutrition: no continuous IV fluids  Central lines / invasive devices: none   Code Status: FULL CODE  Current Admission Status: inpatient  TOC needs / Dispo plan: none at this time Barriers to discharge / significant pending items: clinical improvement, GI clearance prior to d/c              Subjective / Brief ROS:  Patient feeling a bit better today, still abdominal pain. He asks questions about long term plan. He reports he is not interested in drinking anymore and wants to stop.   Family Communication: support person at bedside on rounds     Objective Findings:  Vitals:   05/16/22 1642 05/16/22 2124 05/17/22 0536 05/17/22 0846  BP: 133/69 113/63 126/69 126/67  Pulse: 90 83 80 79  Resp: 17 18 18 18   Temp: 99.1 F (37.3 C) 98.6 F (37 C) 98.3 F (36.8 C) 98.5 F (36.9 C)  TempSrc:      SpO2: 90% 93% 95% 95%  Weight:      Height:        Intake/Output Summary (Last 24 hours) at 05/17/2022 1415 Last data filed at 05/17/2022 1051 Gross per 24 hour  Intake 369.79 ml  Output --  Net 369.79 ml   Filed Weights   05/11/22 0935 05/15/22 0512 05/16/22 0416  Weight: 122.9 kg 112.6 kg 116.4 kg    Examination:  Physical Exam Constitutional:      General: He is not in acute distress.    Appearance: He is obese. He is ill-appearing. He is not toxic-appearing.  Cardiovascular:     Rate and Rhythm: Normal rate and regular rhythm.  Pulmonary:     Effort: Pulmonary effort is normal.     Breath sounds: Normal breath sounds.  Abdominal:     General: Abdomen is protuberant. There is no distension.  Skin:    General: Skin is warm and dry.     Coloration: Skin is jaundiced.          Scheduled Medications:   amLODipine  5 mg Oral Daily   docusate sodium  100 mg Oral BID   enoxaparin (LOVENOX) injection  0.5 mg/kg Subcutaneous [id number]   folic acid  1 mg Oral Daily   lactulose  15 g Oral BID   multivitamin with minerals  1 tablet Oral Daily   prednisoLONE   40 mg Oral QAC breakfast   senna  1 tablet Oral BID   spironolactone  12.5 mg Oral Daily   thiamine  100 mg Oral Daily   Or   thiamine  100 mg Intravenous Daily    Continuous Infusions:  sodium chloride Stopped (05/17/22 0357)   sodium chloride Stopped (05/17/22 1050)    PRN Medications:  sodium chloride, sodium chloride, morphine injection, ondansetron **OR** ondansetron (ZOFRAN) IV, mouth rinse, oxyCODONE  Antimicrobials from admission:  Anti-infectives (From admission, onward)    Start     Dose/Rate Route Frequency Ordered Stop   05/18/22 0800  ciprofloxacin (CIPRO) tablet 500 mg  Status:  Discontinued        500 mg Oral Daily with breakfast 05/16/22 0749 05/17/22 1214   05/16/22 1000  metroNIDAZOLE (FLAGYL) tablet 500 mg  Status:  Discontinued        500 mg Oral Every 12 hours 05/16/22 0716 05/17/22 1118   05/12/22 1100  cefTRIAXone (ROCEPHIN) 2 g  in sodium chloride 0.9 % 100 mL IVPB        2 g 200 mL/hr over 30 Minutes Intravenous Every 24 hours 05/11/22 1446 05/17/22 1120   05/11/22 2200  metroNIDAZOLE (FLAGYL) IVPB 500 mg  Status:  Discontinued        500 mg 100 mL/hr over 60 Minutes Intravenous Every 8 hours 05/11/22 1446 05/11/22 1454   05/11/22 2200  metroNIDAZOLE (FLAGYL) IVPB 500 mg  Status:  Discontinued        500 mg 100 mL/hr over 60 Minutes Intravenous Every 12 hours 05/11/22 1454 05/16/22 0716   05/11/22 1500  cefTRIAXone (ROCEPHIN) 1 g in sodium chloride 0.9 % 100 mL IVPB  Status:  Discontinued        1 g 200 mL/hr over 30 Minutes Intravenous  Once 05/11/22 1449 05/12/22 1607   05/11/22 1430  metroNIDAZOLE (FLAGYL) IVPB 500 mg        500 mg 100 mL/hr over 60 Minutes Intravenous  Once 05/11/22 1421 05/11/22 1530   05/11/22 1030  cefTRIAXone (ROCEPHIN) 1 g in sodium chloride 0.9 % 100 mL IVPB        1 g 200 mL/hr over 30 Minutes Intravenous  Once 05/11/22 1024 05/11/22 1220           Data Reviewed:  I have personally reviewed the  following...  CBC: Recent Labs  Lab 05/11/22 1525 05/12/22 0530 05/13/22 0543 05/14/22 0637 05/16/22 1031  WBC 13.5* 16.2* 15.7* 14.6* 16.0*  HGB 9.8* 9.5* 9.7* 10.1* 10.5*  HCT 27.9* 27.2* 27.5* 28.5* 30.2*  MCV 104.5* 106.3* 104.6* 106.3* 107.1*  PLT 173 161 163 156 [id number]   Basic Metabolic Panel: Recent Labs  Lab 05/11/22 1130 05/11/22 1525 05/12/22 0530 05/13/22 0543 05/13/22 1538 05/14/22 0637 05/15/22 0630 05/16/22 1031 05/17/22 0544  NA  --   --  129* 128*  --  127* 130* 127* 123*  K  --   --  3.1* 2.6* 3.2* 3.3* 3.2* 3.0* 3.7  CL  --   --  90* 90*  --  90* 86* 89* 90*  CO2  --   --  30 28  --  28 30 27 27   GLUCOSE  --   --  96 95  --  94 100* 146* 137*  BUN  --   --  7 13  --  14 11 12 17   CREATININE  --    < > 0.35* 0.64  --  0.60* 0.62 0.71 0.77  CALCIUM  --   --  7.4* 7.3*  --  7.5* 7.8* 7.7* 7.8*  MG 1.8  --  1.5* 1.6*  --   --   --  2.0  --   PHOS  --   --  4.3 3.7  --   --   --   --   --    < > = values in this interval not displayed.   GFR: Estimated Creatinine Clearance: 183.1 mL/min (by C-G formula based on SCr of 0.77 mg/dL). Liver Function Tests: Recent Labs  Lab 05/12/22 0530 05/13/22 0543 05/14/22 0637 05/16/22 1031 05/17/22 0821  AST 154* 123* 116* 111* 89*  ALT 22 21 20 20 22   ALKPHOS 88 82 92 93 86  BILITOT 26.4* 26.7* 27.5* 29.3* 26.1*  PROT 8.2* 8.2* 8.3* 8.5* 8.0  ALBUMIN 1.8* 1.8* 2.0* 2.1* 1.9*   Recent Labs  Lab 05/11/22 0939  LIPASE 75*   No results for input(s): "AMMONIA" in the last  168 hours. Coagulation Profile: Recent Labs  Lab 05/12/22 1155 05/14/22 0637  INR 2.7* 3.2*   Cardiac Enzymes: No results for input(s): "CKTOTAL", "CKMB", "CKMBINDEX", "TROPONINI" in the last 168 hours. BNP (last 3 results) No results for input(s): "PROBNP" in the last 8760 hours. HbA1C: No results for input(s): "HGBA1C" in the last 72 hours. CBG: No results for input(s): "GLUCAP" in the last 168 hours. Lipid Profile: No results  for input(s): "CHOL", "HDL", "LDLCALC", "TRIG", "CHOLHDL", "LDLDIRECT" in the last 72 hours. Thyroid Function Tests: No results for input(s): "TSH", "T4TOTAL", "FREET4", "T3FREE", "THYROIDAB" in the last 72 hours. Anemia Panel: No results for input(s): "VITAMINB12", "FOLATE", "FERRITIN", "TIBC", "IRON", "RETICCTPCT" in the last 72 hours. Most Recent Urinalysis On File:     Component Value Date/Time   COLORURINE AMBER (A) 05/11/2022 1447   APPEARANCEUR CLEAR (A) 05/11/2022 1447   LABSPEC 1.033 (H) 05/11/2022 1447   PHURINE 7.0 05/11/2022 1447   GLUCOSEU NEGATIVE 05/11/2022 1447   HGBUR SMALL (A) 05/11/2022 1447   BILIRUBINUR MODERATE (A) 05/11/2022 1447   KETONESUR NEGATIVE 05/11/2022 1447   PROTEINUR NEGATIVE 05/11/2022 1447   NITRITE NEGATIVE 05/11/2022 1447   LEUKOCYTESUR NEGATIVE 05/11/2022 1447   Sepsis Labs: @LABRCNTIP (procalcitonin:4,lacticidven:4) Microbiology: Recent Results (from the past 240 hour(s))  Varicella-zoster by PCR     Status: None   Collection Time: 05/14/22 11:40 AM   Specimen: Blood  Result Value Ref Range Status   Varicella-Zoster, PCR Negative Negative Final    Comment: (NOTE) No Varicella Zoster Virus DNA detected. This test was developed and its performance characteristics determined by [facility].  It has not been cleared or approved by the Food and Drug Administration.  The FDA has determined that such clearance or approval is not necessary. Performed At: [facility] [place], [place] [id number] Rush Farmer MD [id number]       Radiology Studies last 3 days: ECHOCARDIOGRAM COMPLETE  Result Date: 05/15/2022    ECHOCARDIOGRAM REPORT   Patient Name:   Cameron Santiago Date of Exam: 05/15/2022 Medical Rec #:  [id number]          Height:       71.0 in Accession #:    [id number]         Weight:       248.2 lb Date of Birth:  April 29, 1995           BSA:          2.311 m Patient Age:    26 years           BP:           124/66  mmHg Patient Gender: M                  HR:           86 bpm. Exam Location:  [facility] Procedure: 2D Echo, Cardiac Doppler and Color Doppler Indications:     Cardiomegaly I51.7  History:         Patient has no prior history of Echocardiogram examinations.                  Alcohol abuse, GERD.  Sonographer:     Sherrie Sport Referring Phys:  [id number] Emeterio Reeve Diagnosing Phys: Ida Rogue MD IMPRESSIONS  1. Left ventricular ejection fraction, by estimation, is 60 to 65%. The left ventricle has normal function. The left ventricle has no regional wall motion abnormalities. Left ventricular diastolic parameters were normal.  2. Right  ventricular systolic function is normal. The right ventricular size is normal.  3. The mitral valve is normal in structure. Mild mitral valve regurgitation. No evidence of mitral stenosis.  4. The aortic valve is normal in structure. Aortic valve regurgitation is not visualized. No aortic stenosis is present.  5. The inferior vena cava is normal in size with greater than 50% respiratory variability, suggesting right atrial pressure of 3 mmHg. FINDINGS  Left Ventricle: Left ventricular ejection fraction, by estimation, is 60 to 65%. The left ventricle has normal function. The left ventricle has no regional wall motion abnormalities. The left ventricular internal cavity size was normal in size. There is  no left ventricular hypertrophy. Left ventricular diastolic parameters were normal. Right Ventricle: The right ventricular size is normal. No increase in right ventricular wall thickness. Right ventricular systolic function is normal. Left Atrium: Left atrial size was normal in size. Right Atrium: Right atrial size was normal in size. Pericardium: There is no evidence of pericardial effusion. Mitral Valve: The mitral valve is normal in structure. Mild mitral valve regurgitation. No evidence of mitral valve stenosis. MV peak gradient, 8.8 mmHg. The mean mitral valve gradient is 3.0 mmHg.  Tricuspid Valve: The tricuspid valve is normal in structure. Tricuspid valve regurgitation is mild . No evidence of tricuspid stenosis. Aortic Valve: The aortic valve is normal in structure. Aortic valve regurgitation is not visualized. No aortic stenosis is present. Aortic valve mean gradient measures 9.0 mmHg. Aortic valve peak gradient measures 13.7 mmHg. Aortic valve area, by VTI measures 2.54 cm. Pulmonic Valve: The pulmonic valve was normal in structure. Pulmonic valve regurgitation is not visualized. No evidence of pulmonic stenosis. Aorta: The aortic root is normal in size and structure. Venous: The inferior vena cava is normal in size with greater than 50% respiratory variability, suggesting right atrial pressure of 3 mmHg. IAS/Shunts: No atrial level shunt detected by color flow Doppler.  LEFT VENTRICLE PLAX 2D LVIDd:         5.70 cm   Diastology LVIDs:         3.50 cm   LV e' medial:    13.20 cm/s LV PW:         1.20 cm   LV E/e' medial:  9.8 LV IVS:        0.90 cm   LV e' lateral:   19.60 cm/s LVOT diam:     2.00 cm   LV E/e' lateral: 6.6 LV SV:         91 LV SV Index:   39 LVOT Area:     3.14 cm  RIGHT VENTRICLE RV Basal diam:  4.40 cm RV Mid diam:    4.50 cm LEFT ATRIUM             Index        RIGHT ATRIUM           Index LA diam:        3.90 cm 1.69 cm/m   RA Area:     12.80 cm LA Vol (A2C):   35.0 ml 15.14 ml/m  RA Volume:   28.90 ml  12.50 ml/m LA Vol (A4C):   51.6 ml 22.32 ml/m LA Biplane Vol: 47.1 ml 20.38 ml/m  AORTIC VALVE AV Area (Vmax):    2.39 cm AV Area (Vmean):   2.36 cm AV Area (VTI):     2.54 cm AV Vmax:           185.00 cm/s AV Vmean:  137.000 cm/s AV VTI:            0.359 m AV Peak Grad:      13.7 mmHg AV Mean Grad:      9.0 mmHg LVOT Vmax:         141.00 cm/s LVOT Vmean:        103.000 cm/s LVOT VTI:          0.290 m LVOT/AV VTI ratio: 0.81  AORTA Ao Root diam: 3.10 cm MITRAL VALVE                TRICUSPID VALVE MV Area (PHT): 4.08 cm     TR Peak grad:   34.6 mmHg  MV Area VTI:   3.41 cm     TR Vmax:        294.00 cm/s MV Peak grad:  8.8 mmHg MV Mean grad:  3.0 mmHg     SHUNTS MV Vmax:       1.48 m/s     Systemic VTI:  0.29 m MV Vmean:      83.1 cm/s    Systemic Diam: 2.00 cm MV Decel Time: 186 msec MV E velocity: 130.00 cm/s MV A velocity: 66.60 cm/s MV E/A ratio:  1.95 Ida Rogue MD Electronically signed by Ida Rogue MD Signature Date/Time: 05/15/2022/3:27:32 PM    Final    DG Chest Port 1 View  Result Date: 05/15/2022 CLINICAL DATA:  Cough EXAM: PORTABLE CHEST 1 VIEW COMPARISON:  11/21/2019 x-ray FINDINGS: Enlarged cardiopericardial silhouette. Central vascular congestion. No pneumothorax or effusion. There is some linear opacity left midlung likely scar or atelectasis. IMPRESSION: Underinflation with enlarged cardiopericardial silhouette with vascular congestion. Subtle left midlung opacity as well. Recommend follow-up Electronically Signed   By: Jill Side M.D.   On: 05/15/2022 12:30   US LIVER DOPPLER  Result Date: 05/14/2022 CLINICAL DATA:  Liver failure EXAM: DUPLEX ULTRASOUND OF LIVER TECHNIQUE: Color and duplex Doppler ultrasound was performed to evaluate the hepatic in-flow and out-flow vessels. COMPARISON:  None Available. FINDINGS: Liver: Enlarged liver with heterogeneous and echogenic hepatic parenchyma. No discrete hepatic lesions are identified. No focal lesion, mass or intrahepatic biliary ductal dilatation. Main Portal Vein size: 1.2 cm Portal Vein Velocities Main Prox:  23 cm/sec with sluggish bidirectional flow. Main Mid: 36 cm/sec with sluggish bidirectional flow. Main Dist:  13 cm/sec with sluggish bidirectional flow. Right: 21 cm/sec with hepatopetal flow. Left: 47 cm/sec Hepatic Vein Velocities Right:  22 cm/sec Middle:  29 cm/sec Left:  24 cm/sec IVC: Present and patent with normal respiratory phasicity. Hepatic Artery Velocity:  105 cm/sec Splenic Vein Velocity:  14 cm/sec Spleen: 21 cm x 8.6 cm x 19.8 cm with a total volume of  1,889 cm^3 (411 cm^3 is upper limit normal) Portal Vein Occlusion/Thrombus: No Splenic Vein Occlusion/Thrombus: No Ascites: Trace ascites. Varices: Recanalized periumbilical vein visualized. IMPRESSION: 1. Hepatic cirrhosis with portal hypertension as evidence by sluggish bidirectional flow in the portal vein, recanalized periumbilical vein and significant splenomegaly. 2. Splenomegaly. 3. No discrete hepatic lesion. Signed, Criselda Peaches, MD, [facility] Electronically Signed   By: Jacqulynn Cadet M.D.   On: 05/14/2022 16:27             LOS: 6 days       Emeterio Reeve, DO [facility] 05/17/2022, 2:15 PM    Dictation software may have been used to generate the above note. Typos may occur and escape review in typed/dictated notes.  Please contact Dr Sheppard Coil directly for clarity if needed.  Staff may message me via secure chat in [place]  but this may not receive an immediate response,  please page me for urgent matters!  If 7PM-7AM, please contact night coverage [email or web address]

## 2022-05-18 ENCOUNTER — Inpatient Hospital Stay: Payer: Medicaid Other

## 2022-05-18 LAB — COMPREHENSIVE METABOLIC PANEL
ALT: 24 U/L (ref 0–44)
AST: 75 U/L — ABNORMAL HIGH (ref 15–41)
Albumin: 1.9 g/dL — ABNORMAL LOW (ref 3.5–5.0)
Alkaline Phosphatase: 93 U/L (ref 38–126)
Anion gap: 15 (ref 5–15)
BUN: 21 mg/dL — ABNORMAL HIGH (ref 6–20)
CO2: 28 mmol/L (ref 22–32)
Calcium: 8.4 mg/dL — ABNORMAL LOW (ref 8.9–10.3)
Chloride: 87 mmol/L — ABNORMAL LOW (ref 98–111)
Creatinine, Ser: 0.69 mg/dL (ref 0.61–1.24)
GFR, Estimated: >60 mL/min (ref >60)
Glucose, Bld: 125 mg/dL — ABNORMAL HIGH (ref 70–99)
Potassium: 3.4 mmol/L — ABNORMAL LOW (ref 3.5–5.1)
Sodium: 126 mmol/L — ABNORMAL LOW (ref 135–145)
Total Bilirubin: 25.6 mg/dL (ref 0.3–1.2)
Total Protein: 7.8 g/dL (ref 6.5–8.1)

## 2022-05-18 LAB — EPSTEIN BARR VRS(EBV DNA BY PCR): EBV DNA QN by PCR: POSITIVE IU/mL

## 2022-05-18 MED ORDER — PREDNISONE 20 MG PO TABS
40.0000 mg | ORAL_TABLET | Freq: Every day | ORAL | Status: DC
  Administered 2022-05-19 – 2022-05-23 (×5): 40 mg via ORAL
  Filled 2022-05-18 (×5): qty 2

## 2022-05-18 MED ORDER — POTASSIUM CHLORIDE CRYS ER 20 MEQ PO TBCR
40.0000 meq | EXTENDED_RELEASE_TABLET | Freq: Once | ORAL | Status: AC
  Administered 2022-05-18: 40 meq via ORAL
  Filled 2022-05-18: qty 2

## 2022-05-18 MED ORDER — FUROSEMIDE 40 MG PO TABS
40.0000 mg | ORAL_TABLET | Freq: Every day | ORAL | Status: DC
  Administered 2022-05-18 – 2022-05-19 (×2): 40 mg via ORAL
  Filled 2022-05-18 (×2): qty 1

## 2022-05-18 NOTE — Progress Notes (Signed)
PROGRESS NOTE    Cameron Santiago   [id number] DOB: [birth year]  DOA: 05/11/2022 Date of Service: 05/18/22 PCP: Merryl Hacker, No     Brief Narrative / Hospital Course:  This 27 y.o. male with PMH significant for EtOH liver cirrhosis, ongoing EtOH use, Essential hypertension, presented in the ED with abdominal distention associated with abdominal pain for last few days.  Patient drinks  ETOH regularly, 1 pint of hard liquor every day.  Patient denies any nausea,  vomiting. Patient also reports subjective fever at night with chills, He denies any GI bleeding, dizziness, weakness, urinary symptoms, denies any recent travel or  URI symptoms.  03/18: ED workup: Sodium 131, potassium 2.5, anion gap 13, magnesium 1.8, alkaline phosphatase 107, albumin 2.1, lipase 75, AST 169, ALT 21, ALT 9.4, total bilirubin 28.4, total protein 9.4, WBC 14.4, hemoglobin 10.9, hematocrit 31.2, MCV 104.7, platelet 192, influenza negative, COVID-negative, RSV negative, UA: Ketones negative, LE negative, nitrate negative. CTA/P:  Hepatosplenomegaly, Hepatic cirrhosis, Distended gallbladder, Perigastric and perisplenic varices. Small ascites. Admitted for treatment and monitoring EtOH withdrawal  03/19:  GI saw patient. NPO 03/20: Advance to CLD. Pt is concerned about EtOH w/drawal, remains on CIWA protocol  03/21: liver doppler (+)cirrhosis and portal HTN, advancing to full liquids.  03/22: advancing diet. (+)cough and rales, CXR showing cardiomegaly and vascular congestion --> lasix + albumin, Echo to eval further.  03/23: no concerns on Echocardiogram w/ EF 60-65% no RWMA and no diastolic df, diuresed well yesterday will repeat Lasix today. Dr Allen Norris w/ GI checked up on previous paracentesis labs and no SBP, ok to start steroids, he will be placing these orders. Given deterioration on labs / increased MELD score, Dr Allen Norris also spoke w/ [facility] transplant team - given previous pancreatitis treatment, patient is not a transplant  candidate.  03/24: hyponatremia worse to 123, reduced spironolactone. GI following. D/c abx. Bili slightly improved on steroids. GI and hospitalist teams have discussed that he is not a transplant candidate - see GI note from today.  03/25: sodium improved to 126. Bili slightly improved as well to 25. Continuing steroids.   Consultants:  General surgery  Gastroenterology   Procedures: 05/11/22 Paracentesis       ASSESSMENT & PLAN:   Principal Problem:   Ascites d/e alcoholic hepatitis/cirrhosis vs other or comorbid hepatic abnormality such as WIlson's Active Problems:   Alcoholic cirrhosis (HCC)   Alcohol dependence with withdrawal (HCC)   Hypokalemia   GERD (gastroesophageal reflux disease)   Obesity (BMI 30-39.9)   Elevated bilirubin   Generalized abdominal pain   Liver failure d/t alcoholic hepatitis/cirrhosis, possibly +/- comorbid hepatic abnormality such as Wilson's  Ascites and abdominal distension  Alcoholic cirrhosis Portal HTN Perigastric and perisplenic varices Concern for SBP - ruled out GI following  Serial abdominal exam - of change/worse obtain imaging Per GI, as an outpatient will require an EGD to screen for esophageal versus and full autoimmune and viral hepatitis workup to determine why he has cirrhosis at the age of 79 which could obviously be from alcohol but other conditions such as Wilson's disease to be ruled out With concern for SBP, steroids for alcoholic hepatitis had initially not been started but were initiated 03/23   Po furosemide started  Increased spironolactone to 100 mg but worsening hyponatremia, reduced dose and sodium improved, will consider gradual titration up per GI recs    Cardiomegaly Pulmonary vascular congestion  Likely d/t Cardiohepatic syndrome  Echocardiogram w/ EF 60-65% no RWMA and no  diastolic df, IV Albumin + Lasix as needed  Po lasix  Monitor potassium  Strict I&O  Alcohol dependence with withdrawal  IV thiamine  to prevent Wernicke's encephalopathy CIWA protocol   Hyponatremia Likely will be chronic d/t liver failure Brief worsening likely d/t spironolactone Reduced spiro dose Fluid restrict to 1800 mL/day Monitor BMP  Hypokalemia  Monitor closely on Lasix  Replace as needed Monitor BMP  Hypocalcemia Replace as needed Monitor BMP    DVT prophylaxis: SCD Pertinent IV fluids/nutrition: no continuous IV fluids  Central lines / invasive devices: none   Code Status: FULL CODE  Current Admission Status: inpatient  TOC needs / Dispo plan: none at this time Barriers to discharge / significant pending items: clinical improvement, GI clearance prior to d/c              Subjective / Brief ROS:  Patient not wanting to talk today, wanting to rest, denies any new concerns.   Family Communication: support person at bedside on rounds     Objective Findings:  Vitals:   05/17/22 1852 05/17/22 2053 05/18/22 0507 05/18/22 0741  BP: 128/68 (!) 125/54 123/65 (!) 140/73  Pulse: 84 80 80 79  Resp: 18 18 18 16   Temp: 99 F (37.2 C) 98.4 F (36.9 C) 98 F (36.7 C) 98.1 F (36.7 C)  TempSrc:  Oral Oral   SpO2: 98% 95% 93% 97%  Weight:      Height:        Intake/Output Summary (Last 24 hours) at 05/18/2022 1353 Last data filed at 05/18/2022 0955 Gross per 24 hour  Intake 480 ml  Output --  Net 480 ml   Filed Weights   05/11/22 0935 05/15/22 0512 05/16/22 0416  Weight: 122.9 kg 112.6 kg 116.4 kg    Examination:  Physical Exam Constitutional:      General: He is not in acute distress.    Appearance: He is obese. He is ill-appearing. He is not toxic-appearing.  Pulmonary:     Effort: Pulmonary effort is normal.  Abdominal:     General: Abdomen is protuberant. There is no distension.  Skin:    Coloration: Skin is jaundiced.          Scheduled Medications:   amLODipine  5 mg Oral Daily   docusate sodium  100 mg Oral BID   enoxaparin (LOVENOX) injection  0.5  mg/kg Subcutaneous [id number]   folic acid  1 mg Oral Daily   furosemide  40 mg Oral Daily   lactulose  15 g Oral BID   multivitamin with minerals  1 tablet Oral Daily   [START ON 05/19/2022] predniSONE  40 mg Oral Q breakfast   senna  1 tablet Oral BID   spironolactone  12.5 mg Oral Daily   thiamine  100 mg Oral Daily   Or   thiamine  100 mg Intravenous Daily    Continuous Infusions:  sodium chloride Stopped (05/17/22 0357)   sodium chloride Stopped (05/17/22 1050)    PRN Medications:  sodium chloride, sodium chloride, morphine injection, ondansetron **OR** ondansetron (ZOFRAN) IV, mouth rinse, oxyCODONE  Antimicrobials from admission:  Anti-infectives (From admission, onward)    Start     Dose/Rate Route Frequency Ordered Stop   05/18/22 0800  ciprofloxacin (CIPRO) tablet 500 mg  Status:  Discontinued        500 mg Oral Daily with breakfast 05/16/22 0749 05/17/22 1214   05/16/22 1000  metroNIDAZOLE (FLAGYL) tablet 500 mg  Status:  Discontinued  500 mg Oral Every 12 hours 05/16/22 0716 05/17/22 1118   05/12/22 1100  cefTRIAXone (ROCEPHIN) 2 g in sodium chloride 0.9 % 100 mL IVPB        2 g 200 mL/hr over 30 Minutes Intravenous Every 24 hours 05/11/22 1446 05/17/22 1934   05/11/22 2200  metroNIDAZOLE (FLAGYL) IVPB 500 mg  Status:  Discontinued        500 mg 100 mL/hr over 60 Minutes Intravenous Every 8 hours 05/11/22 1446 05/11/22 1454   05/11/22 2200  metroNIDAZOLE (FLAGYL) IVPB 500 mg  Status:  Discontinued        500 mg 100 mL/hr over 60 Minutes Intravenous Every 12 hours 05/11/22 1454 05/16/22 0716   05/11/22 1500  cefTRIAXone (ROCEPHIN) 1 g in sodium chloride 0.9 % 100 mL IVPB  Status:  Discontinued        1 g 200 mL/hr over 30 Minutes Intravenous  Once 05/11/22 1449 05/12/22 1607   05/11/22 1430  metroNIDAZOLE (FLAGYL) IVPB 500 mg        500 mg 100 mL/hr over 60 Minutes Intravenous  Once 05/11/22 1421 05/11/22 1530   05/11/22 1030  cefTRIAXone (ROCEPHIN) 1 g in sodium  chloride 0.9 % 100 mL IVPB        1 g 200 mL/hr over 30 Minutes Intravenous  Once 05/11/22 1024 05/11/22 1220           Data Reviewed:  I have personally reviewed the following...  CBC: Recent Labs  Lab 05/11/22 1525 05/12/22 0530 05/13/22 0543 05/14/22 0637 05/16/22 1031  WBC 13.5* 16.2* 15.7* 14.6* 16.0*  HGB 9.8* 9.5* 9.7* 10.1* 10.5*  HCT 27.9* 27.2* 27.5* 28.5* 30.2*  MCV 104.5* 106.3* 104.6* 106.3* 107.1*  PLT 173 161 163 156 [id number]   Basic Metabolic Panel: Recent Labs  Lab 05/12/22 0530 05/13/22 0543 05/13/22 1538 05/14/22 0637 05/15/22 0630 05/16/22 1031 05/17/22 0544 05/18/22 0426  NA 129* 128*  --  127* 130* 127* 123* 126*  K 3.1* 2.6*   < > 3.3* 3.2* 3.0* 3.7 3.4*  CL 90* 90*  --  90* 86* 89* 90* 87*  CO2 30 28  --  28 30 27 27 28   GLUCOSE 96 95  --  94 100* 146* 137* 125*  BUN 7 13  --  14 11 12 17  21*  CREATININE 0.35* 0.64  --  0.60* 0.62 0.71 0.77 0.69  CALCIUM 7.4* 7.3*  --  7.5* 7.8* 7.7* 7.8* 8.4*  MG 1.5* 1.6*  --   --   --  2.0  --   --   PHOS 4.3 3.7  --   --   --   --   --   --    < > = values in this interval not displayed.   GFR: Estimated Creatinine Clearance: 183.1 mL/min (by C-G formula based on SCr of 0.69 mg/dL). Liver Function Tests: Recent Labs  Lab 05/13/22 0543 05/14/22 0637 05/16/22 1031 05/17/22 0821 05/18/22 0426  AST 123* 116* 111* 89* 75*  ALT 21 20 20 22 24   ALKPHOS 82 92 93 86 93  BILITOT 26.7* 27.5* 29.3* 26.1* 25.6*  PROT 8.2* 8.3* 8.5* 8.0 7.8  ALBUMIN 1.8* 2.0* 2.1* 1.9* 1.9*   No results for input(s): "LIPASE", "AMYLASE" in the last 168 hours.  No results for input(s): "AMMONIA" in the last 168 hours. Coagulation Profile: Recent Labs  Lab 05/12/22 1155 05/14/22 0637  INR 2.7* 3.2*   Cardiac Enzymes: No results for input(s): "  CKTOTAL", "CKMB", "CKMBINDEX", "TROPONINI" in the last 168 hours. BNP (last 3 results) No results for input(s): "PROBNP" in the last 8760 hours. HbA1C: No results for  input(s): "HGBA1C" in the last 72 hours. CBG: No results for input(s): "GLUCAP" in the last 168 hours. Lipid Profile: No results for input(s): "CHOL", "HDL", "LDLCALC", "TRIG", "CHOLHDL", "LDLDIRECT" in the last 72 hours. Thyroid Function Tests: No results for input(s): "TSH", "T4TOTAL", "FREET4", "T3FREE", "THYROIDAB" in the last 72 hours. Anemia Panel: No results for input(s): "VITAMINB12", "FOLATE", "FERRITIN", "TIBC", "IRON", "RETICCTPCT" in the last 72 hours. Most Recent Urinalysis On File:     Component Value Date/Time   COLORURINE AMBER (A) 05/11/2022 1447   APPEARANCEUR CLEAR (A) 05/11/2022 1447   LABSPEC 1.033 (H) 05/11/2022 1447   PHURINE 7.0 05/11/2022 1447   GLUCOSEU NEGATIVE 05/11/2022 1447   HGBUR SMALL (A) 05/11/2022 1447   BILIRUBINUR MODERATE (A) 05/11/2022 1447   KETONESUR NEGATIVE 05/11/2022 1447   PROTEINUR NEGATIVE 05/11/2022 1447   NITRITE NEGATIVE 05/11/2022 1447   LEUKOCYTESUR NEGATIVE 05/11/2022 1447   Sepsis Labs: @LABRCNTIP (procalcitonin:4,lacticidven:4) Microbiology: Recent Results (from the past 240 hour(s))  Varicella-zoster by PCR     Status: None   Collection Time: 05/14/22 11:40 AM   Specimen: Blood  Result Value Ref Range Status   Varicella-Zoster, PCR Negative Negative Final    Comment: (NOTE) No Varicella Zoster Virus DNA detected. This test was developed and its performance characteristics determined by [facility].  It has not been cleared or approved by the Food and Drug Administration.  The FDA has determined that such clearance or approval is not necessary. Performed At: [facility] [place], [place] [id number] Rush Farmer MD [id number]       Radiology Studies last 3 days: US Abdomen Limited  Result Date: 05/18/2022 CLINICAL DATA:  [id number] Hepatic failure ([place]) [id number] EXAM: LIMITED ABDOMEN ULTRASOUND FOR ASCITES TECHNIQUE: Limited ultrasound survey for ascites was performed in all four abdominal  quadrants. COMPARISON:  None Available. FINDINGS: Limited sonographic exam of the abdomen was performed for fluid assessment. Images demonstrate small amount of perihepatic ascites. The patient declined paracentesis. IMPRESSION: Small volume perihepatic ascites. The patient declined paracentesis. Electronically Signed   By: Albin Felling M.D.   On: 05/18/2022 12:12   ECHOCARDIOGRAM COMPLETE  Result Date: 05/15/2022    ECHOCARDIOGRAM REPORT   Patient Name:   Cameron Santiago Date of Exam: 05/15/2022 Medical Rec #:  [id number]          Height:       71.0 in Accession #:    [id number]         Weight:       248.2 lb Date of Birth:  December 18, 1995           BSA:          2.311 m Patient Age:    26 years           BP:           124/66 mmHg Patient Gender: M                  HR:           86 bpm. Exam Location:  [facility] Procedure: 2D Echo, Cardiac Doppler and Color Doppler Indications:     Cardiomegaly I51.7  History:         Patient has no prior history of Echocardiogram examinations.  Alcohol abuse, GERD.  Sonographer:     Sherrie Sport Referring Phys:  [id number] Emeterio Reeve Diagnosing Phys: Ida Rogue MD IMPRESSIONS  1. Left ventricular ejection fraction, by estimation, is 60 to 65%. The left ventricle has normal function. The left ventricle has no regional wall motion abnormalities. Left ventricular diastolic parameters were normal.  2. Right ventricular systolic function is normal. The right ventricular size is normal.  3. The mitral valve is normal in structure. Mild mitral valve regurgitation. No evidence of mitral stenosis.  4. The aortic valve is normal in structure. Aortic valve regurgitation is not visualized. No aortic stenosis is present.  5. The inferior vena cava is normal in size with greater than 50% respiratory variability, suggesting right atrial pressure of 3 mmHg. FINDINGS  Left Ventricle: Left ventricular ejection fraction, by estimation, is 60 to 65%. The left ventricle has normal  function. The left ventricle has no regional wall motion abnormalities. The left ventricular internal cavity size was normal in size. There is  no left ventricular hypertrophy. Left ventricular diastolic parameters were normal. Right Ventricle: The right ventricular size is normal. No increase in right ventricular wall thickness. Right ventricular systolic function is normal. Left Atrium: Left atrial size was normal in size. Right Atrium: Right atrial size was normal in size. Pericardium: There is no evidence of pericardial effusion. Mitral Valve: The mitral valve is normal in structure. Mild mitral valve regurgitation. No evidence of mitral valve stenosis. MV peak gradient, 8.8 mmHg. The mean mitral valve gradient is 3.0 mmHg. Tricuspid Valve: The tricuspid valve is normal in structure. Tricuspid valve regurgitation is mild . No evidence of tricuspid stenosis. Aortic Valve: The aortic valve is normal in structure. Aortic valve regurgitation is not visualized. No aortic stenosis is present. Aortic valve mean gradient measures 9.0 mmHg. Aortic valve peak gradient measures 13.7 mmHg. Aortic valve area, by VTI measures 2.54 cm. Pulmonic Valve: The pulmonic valve was normal in structure. Pulmonic valve regurgitation is not visualized. No evidence of pulmonic stenosis. Aorta: The aortic root is normal in size and structure. Venous: The inferior vena cava is normal in size with greater than 50% respiratory variability, suggesting right atrial pressure of 3 mmHg. IAS/Shunts: No atrial level shunt detected by color flow Doppler.  LEFT VENTRICLE PLAX 2D LVIDd:         5.70 cm   Diastology LVIDs:         3.50 cm   LV e' medial:    13.20 cm/s LV PW:         1.20 cm   LV E/e' medial:  9.8 LV IVS:        0.90 cm   LV e' lateral:   19.60 cm/s LVOT diam:     2.00 cm   LV E/e' lateral: 6.6 LV SV:         91 LV SV Index:   39 LVOT Area:     3.14 cm  RIGHT VENTRICLE RV Basal diam:  4.40 cm RV Mid diam:    4.50 cm LEFT ATRIUM              Index        RIGHT ATRIUM           Index LA diam:        3.90 cm 1.69 cm/m   RA Area:     12.80 cm LA Vol (A2C):   35.0 ml 15.14 ml/m  RA Volume:   28.90 ml  12.50 ml/m  LA Vol (A4C):   51.6 ml 22.32 ml/m LA Biplane Vol: 47.1 ml 20.38 ml/m  AORTIC VALVE AV Area (Vmax):    2.39 cm AV Area (Vmean):   2.36 cm AV Area (VTI):     2.54 cm AV Vmax:           185.00 cm/s AV Vmean:          137.000 cm/s AV VTI:            0.359 m AV Peak Grad:      13.7 mmHg AV Mean Grad:      9.0 mmHg LVOT Vmax:         141.00 cm/s LVOT Vmean:        103.000 cm/s LVOT VTI:          0.290 m LVOT/AV VTI ratio: 0.81  AORTA Ao Root diam: 3.10 cm MITRAL VALVE                TRICUSPID VALVE MV Area (PHT): 4.08 cm     TR Peak grad:   34.6 mmHg MV Area VTI:   3.41 cm     TR Vmax:        294.00 cm/s MV Peak grad:  8.8 mmHg MV Mean grad:  3.0 mmHg     SHUNTS MV Vmax:       1.48 m/s     Systemic VTI:  0.29 m MV Vmean:      83.1 cm/s    Systemic Diam: 2.00 cm MV Decel Time: 186 msec MV E velocity: 130.00 cm/s MV A velocity: 66.60 cm/s MV E/A ratio:  1.95 Ida Rogue MD Electronically signed by Ida Rogue MD Signature Date/Time: 05/15/2022/3:27:32 PM    Final    DG Chest Port 1 View  Result Date: 05/15/2022 CLINICAL DATA:  Cough EXAM: PORTABLE CHEST 1 VIEW COMPARISON:  11/21/2019 x-ray FINDINGS: Enlarged cardiopericardial silhouette. Central vascular congestion. No pneumothorax or effusion. There is some linear opacity left midlung likely scar or atelectasis. IMPRESSION: Underinflation with enlarged cardiopericardial silhouette with vascular congestion. Subtle left midlung opacity as well. Recommend follow-up Electronically Signed   By: Jill Side M.D.   On: 05/15/2022 12:30   US LIVER DOPPLER  Result Date: 05/14/2022 CLINICAL DATA:  Liver failure EXAM: DUPLEX ULTRASOUND OF LIVER TECHNIQUE: Color and duplex Doppler ultrasound was performed to evaluate the hepatic in-flow and out-flow vessels. COMPARISON:  None Available.  FINDINGS: Liver: Enlarged liver with heterogeneous and echogenic hepatic parenchyma. No discrete hepatic lesions are identified. No focal lesion, mass or intrahepatic biliary ductal dilatation. Main Portal Vein size: 1.2 cm Portal Vein Velocities Main Prox:  23 cm/sec with sluggish bidirectional flow. Main Mid: 36 cm/sec with sluggish bidirectional flow. Main Dist:  13 cm/sec with sluggish bidirectional flow. Right: 21 cm/sec with hepatopetal flow. Left: 47 cm/sec Hepatic Vein Velocities Right:  22 cm/sec Middle:  29 cm/sec Left:  24 cm/sec IVC: Present and patent with normal respiratory phasicity. Hepatic Artery Velocity:  105 cm/sec Splenic Vein Velocity:  14 cm/sec Spleen: 21 cm x 8.6 cm x 19.8 cm with a total volume of 1,889 cm^3 (411 cm^3 is upper limit normal) Portal Vein Occlusion/Thrombus: No Splenic Vein Occlusion/Thrombus: No Ascites: Trace ascites. Varices: Recanalized periumbilical vein visualized. IMPRESSION: 1. Hepatic cirrhosis with portal hypertension as evidence by sluggish bidirectional flow in the portal vein, recanalized periumbilical vein and significant splenomegaly. 2. Splenomegaly. 3. No discrete hepatic lesion. Signed, Criselda Peaches, MD, [facility]  Radiology Electronically Signed   By: Jacqulynn Cadet M.D.   On: 05/14/2022 16:27             LOS: 7 days       Emeterio Reeve, DO [facility] 05/18/2022, 1:53 PM    Dictation software may have been used to generate the above note. Typos may occur and escape review in typed/dictated notes. Please contact Dr Sheppard Coil directly for clarity if needed.  Staff may message me via secure chat in [place]  but this may not receive an immediate response,  please page me for urgent matters!  If 7PM-7AM, please contact night coverage [email or web address]

## 2022-05-18 NOTE — Progress Notes (Signed)
Patient brought to [place] suite for ordered paracentesis today. Patient declined to give consent for procedure today, no paracentesis was attempted. Please contact IR team with any questions or concerns. Lura Em, PA-C 05/18/2022 11:59 AM

## 2022-05-18 NOTE — Progress Notes (Signed)
Lucilla Lame, MD [facility]   [address] [place], [place] [address] Phone: [phone] Fax : [phone]   Subjective: The patient reports that he had fevers and chills last night and was sent for an ultrasound that showed a small amount of ascites but the patient declined to give consent for a paracentesis.  He reports that he is feeling better now.   Objective: Vital signs in last 24 hours: Vitals:   05/17/22 2053 05/18/22 0507 05/18/22 0741 05/18/22 1617  BP: (!) 125/54 123/65 (!) 140/73 (!) 142/69  Pulse: 80 80 79 74  Resp: 18 18 16 16   Temp: 98.4 F (36.9 C) 98 F (36.7 C) 98.1 F (36.7 C) 98.8 F (37.1 C)  TempSrc: Oral Oral    SpO2: 95% 93% 97% 94%  Weight:      Height:       Weight change:   Intake/Output Summary (Last 24 hours) at 05/18/2022 1725 Last data filed at 05/18/2022 1400 Gross per 24 hour  Intake 720 ml  Output --  Net 720 ml     Exam: Heart:: Regular rate and rhythm or without murmur or extra heart sounds Lungs: normal and clear to auscultation and percussion Abdomen: soft, nontender, normal bowel sounds   Lab Results: @LABTEST2 @ Micro Results: Recent Results (from the past 240 hour(s))  Varicella-zoster by PCR     Status: None   Collection Time: 05/14/22 11:40 AM   Specimen: Blood  Result Value Ref Range Status   Varicella-Zoster, PCR Negative Negative Final    Comment: (NOTE) No Varicella Zoster Virus DNA detected. This test was developed and its performance characteristics determined by [facility].  It has not been cleared or approved by the Food and Drug Administration.  The FDA has determined that such clearance or approval is not necessary. Performed At: [facility] [place], [place] [id number] Rush Farmer MD [id number]    Studies/Results: US Abdomen Limited  Result Date: 05/18/2022 CLINICAL DATA:  [id number] Hepatic failure [facility]) [id number] EXAM: LIMITED ABDOMEN ULTRASOUND FOR ASCITES TECHNIQUE:  Limited ultrasound survey for ascites was performed in all four abdominal quadrants. COMPARISON:  None Available. FINDINGS: Limited sonographic exam of the abdomen was performed for fluid assessment. Images demonstrate small amount of perihepatic ascites. The patient declined paracentesis. IMPRESSION: Small volume perihepatic ascites. The patient declined paracentesis. Electronically Signed   By: Albin Felling M.D.   On: 05/18/2022 12:12   Medications: I have reviewed the patient's current medications. Scheduled Meds:  amLODipine  5 mg Oral Daily   docusate sodium  100 mg Oral BID   enoxaparin (LOVENOX) injection  0.5 mg/kg Subcutaneous [id number]   folic acid  1 mg Oral Daily   furosemide  40 mg Oral Daily   lactulose  15 g Oral BID   multivitamin with minerals  1 tablet Oral Daily   [START ON 05/19/2022] predniSONE  40 mg Oral Q breakfast   senna  1 tablet Oral BID   spironolactone  12.5 mg Oral Daily   thiamine  100 mg Oral Daily   Or   thiamine  100 mg Intravenous Daily   Continuous Infusions:  sodium chloride Stopped (05/17/22 0357)   sodium chloride Stopped (05/17/22 1050)   PRN Meds:.sodium chloride, sodium chloride, morphine injection, ondansetron **OR** ondansetron (ZOFRAN) IV, mouth rinse, oxyCODONE   Assessment: Principal Problem:   Alcoholic hepatitis with ascites Active Problems:   Alcohol dependence with withdrawal (HCC)   GERD (gastroesophageal reflux disease)   Hypokalemia  Alcoholic cirrhosis (HCC)   Obesity (BMI 30-39.9)   Elevated bilirubin   Generalized abdominal pain    Plan: This patient has advanced liver cirrhosis likely from alcohol abuse.  His other labs did not show any cause for his cirrhosis.  The patient should continue with supportive care.  Nothing further to do from a GI point of view at this time.   LOS: 7 days   Lewayne Bunting 05/18/2022, 5:25 PM Pager [phone] 7am-5pm  Check AMION for 5pm -7am coverage and on weekends

## 2022-05-19 ENCOUNTER — Inpatient Hospital Stay: Payer: Medicaid Other

## 2022-05-19 LAB — CBC WITH DIFFERENTIAL/PLATELET
Abs Immature Granulocytes: 0.15 10*3/uL — ABNORMAL HIGH (ref 0.00–0.07)
Basophils Absolute: 0.1 10*3/uL (ref 0.0–0.1)
Basophils Relative: 0 %
Eosinophils Absolute: 0 10*3/uL (ref 0.0–0.5)
Eosinophils Relative: 0 %
HCT: 29.6 % — ABNORMAL LOW (ref 39.0–52.0)
Hemoglobin: 10.4 g/dL — ABNORMAL LOW (ref 13.0–17.0)
Immature Granulocytes: 1 %
Lymphocytes Relative: 6 %
Lymphs Abs: 1.1 10*3/uL (ref 0.7–4.0)
MCH: 38.2 pg — ABNORMAL HIGH (ref 26.0–34.0)
MCHC: 35.1 g/dL (ref 30.0–36.0)
MCV: 108.8 fL — ABNORMAL HIGH (ref 80.0–100.0)
Monocytes Absolute: 1.4 10*3/uL — ABNORMAL HIGH (ref 0.1–1.0)
Monocytes Relative: 8 %
Neutro Abs: 15.7 10*3/uL — ABNORMAL HIGH (ref 1.7–7.7)
Neutrophils Relative %: 85 %
Platelets: 223 10*3/uL (ref 150–400)
RBC: 2.72 MIL/uL — ABNORMAL LOW (ref 4.22–5.81)
RDW: 19 % — ABNORMAL HIGH (ref 11.5–15.5)
WBC: 18.4 10*3/uL — ABNORMAL HIGH (ref 4.0–10.5)
nRBC: 0.1 % (ref 0.0–0.2)

## 2022-05-19 LAB — COMPREHENSIVE METABOLIC PANEL
ALT: 25 U/L (ref 0–44)
AST: 84 U/L — ABNORMAL HIGH (ref 15–41)
Albumin: 1.9 g/dL — ABNORMAL LOW (ref 3.5–5.0)
Alkaline Phosphatase: 100 U/L (ref 38–126)
Anion gap: 8 (ref 5–15)
BUN: 22 mg/dL — ABNORMAL HIGH (ref 6–20)
CO2: 28 mmol/L (ref 22–32)
Calcium: 7.9 mg/dL — ABNORMAL LOW (ref 8.9–10.3)
Chloride: 92 mmol/L — ABNORMAL LOW (ref 98–111)
Creatinine, Ser: 0.6 mg/dL — ABNORMAL LOW (ref 0.61–1.24)
GFR, Estimated: >60 mL/min (ref >60)
Glucose, Bld: 128 mg/dL — ABNORMAL HIGH (ref 70–99)
Potassium: 3.3 mmol/L — ABNORMAL LOW (ref 3.5–5.1)
Sodium: 128 mmol/L — ABNORMAL LOW (ref 135–145)
Total Bilirubin: 24.1 mg/dL (ref 0.3–1.2)
Total Protein: 7.9 g/dL (ref 6.5–8.1)

## 2022-05-19 LAB — CBC
HCT: 29.3 % — ABNORMAL LOW (ref 39.0–52.0)
Hemoglobin: 10.3 g/dL — ABNORMAL LOW (ref 13.0–17.0)
MCH: 37.7 pg — ABNORMAL HIGH (ref 26.0–34.0)
MCHC: 35.2 g/dL (ref 30.0–36.0)
MCV: 107.3 fL — ABNORMAL HIGH (ref 80.0–100.0)
Platelets: 217 10*3/uL (ref 150–400)
RBC: 2.73 MIL/uL — ABNORMAL LOW (ref 4.22–5.81)
RDW: 18.8 % — ABNORMAL HIGH (ref 11.5–15.5)
WBC: 18 10*3/uL — ABNORMAL HIGH (ref 4.0–10.5)
nRBC: 0 % (ref 0.0–0.2)

## 2022-05-19 LAB — ABO/RH: ABO/RH(D): O POS

## 2022-05-19 MED ORDER — POTASSIUM CHLORIDE CRYS ER 20 MEQ PO TBCR
40.0000 meq | EXTENDED_RELEASE_TABLET | Freq: Once | ORAL | Status: AC
  Administered 2022-05-19: 40 meq via ORAL
  Filled 2022-05-19: qty 2

## 2022-05-19 MED ORDER — FUROSEMIDE 40 MG PO TABS
40.0000 mg | ORAL_TABLET | Freq: Two times a day (BID) | ORAL | Status: DC
  Administered 2022-05-19 – 2022-06-02 (×29): 40 mg via ORAL
  Filled 2022-05-19 (×29): qty 1

## 2022-05-19 MED ORDER — POTASSIUM CHLORIDE CRYS ER 20 MEQ PO TBCR
40.0000 meq | EXTENDED_RELEASE_TABLET | Freq: Every day | ORAL | Status: DC
  Administered 2022-05-19: 40 meq via ORAL
  Filled 2022-05-19: qty 2

## 2022-05-19 NOTE — Progress Notes (Signed)
PROGRESS NOTE    Cameron Santiago   [id number] DOB: [birth year]  DOA: 05/11/2022 Date of Service: 05/19/22 PCP: Merryl Hacker, No     Brief Narrative / Hospital Course:  This 27 y.o. male with PMH significant for EtOH liver cirrhosis, ongoing EtOH use, Essential hypertension, presented in the ED with abdominal distention associated with abdominal pain for last few days.  Patient drinks  ETOH regularly, 1 pint of hard liquor every day.  Patient denies any nausea,  vomiting. Patient also reports subjective fever at night with chills, He denies any GI bleeding, dizziness, weakness, urinary symptoms, denies any recent travel or  URI symptoms.  03/18: ED workup: Sodium 131, potassium 2.5, anion gap 13, magnesium 1.8, alkaline phosphatase 107, albumin 2.1, lipase 75, AST 169, ALT 21, ALT 9.4, total bilirubin 28.4, total protein 9.4, WBC 14.4, hemoglobin 10.9, hematocrit 31.2, MCV 104.7, platelet 192, influenza negative, COVID-negative, RSV negative, UA: Ketones negative, LE negative, nitrate negative. CTA/P:  Hepatosplenomegaly, Hepatic cirrhosis, Distended gallbladder, Perigastric and perisplenic varices. Small ascites. Admitted for treatment and monitoring EtOH withdrawal  03/19:  GI saw patient. NPO 03/20: Advance to CLD. Pt is concerned about EtOH w/drawal, remains on CIWA protocol  03/21: liver doppler (+)cirrhosis and portal HTN, advancing to full liquids.  03/22: advancing diet. (+)cough and rales, CXR showing cardiomegaly and vascular congestion --> lasix + albumin, Echo to eval further.  03/23: no concerns on Echocardiogram w/ EF 60-65% no RWMA and no diastolic df, diuresed well yesterday will repeat Lasix today. Dr Allen Norris w/ GI checked up on previous paracentesis labs and no SBP, ok to start steroids, he will be placing these orders. Given deterioration on labs / increased MELD score, Dr Allen Norris also spoke w/ [facility] transplant team - given previous pancreatitis treatment, patient is not a transplant  candidate.  03/24: hyponatremia worse to 123, reduced spironolactone. GI following. D/c abx. Bili slightly improved on steroids. GI and hospitalist teams have discussed that he is not a transplant candidate - see GI note from today.  03/25: sodium improved to 126. Bili slightly improved as well to 25. Continuing steroids. Per GI, "advanced liver cirrhosis likely from alcohol abuse. His other labs did not show any cause for his cirrhosis. The patient should continue with supportive care. Nothing further to do from a GI point of view at this time." 03/26: bilirubin down to 24, sodium 128, WBC increased to 18 w/ neutrophils, likely d/t steroids however pt reports subjective fever/chills, no cough, abdominal pain is stable. Getting CXR given some concern for infiltrate on previous but no obvious pneumonia, low threshold for restarting ceftriaxone, he declined paracentesis but may need to revisit this. Will leave on steroids for now, low suspicion for infection. Increased lasix.  Consultants:  General surgery  Gastroenterology   Procedures: 05/11/22 Paracentesis       ASSESSMENT & PLAN:   Principal Problem:   Ascites d/e alcoholic hepatitis/cirrhosis vs other or comorbid hepatic abnormality such as WIlson's Active Problems:   Alcoholic cirrhosis (HCC)   Alcohol dependence with withdrawal (HCC)   Hypokalemia   GERD (gastroesophageal reflux disease)   Obesity (BMI 30-39.9)   Elevated bilirubin   Generalized abdominal pain   Liver failure d/t alcoholic hepatitis/cirrhosis, possibly +/- comorbid hepatic abnormality such as Wilson's  Ascites and abdominal distension  Alcoholic cirrhosis Portal HTN Perigastric and perisplenic varices Concern for SBP - ruled out GI following  Serial abdominal exam - of change/worse obtain imaging Per GI, as an outpatient will require an EGD  to screen for esophageal versus and full autoimmune and viral hepatitis workup to determine why he has cirrhosis at  the age of 34 which could obviously be from alcohol but other conditions such as Wilson's disease to be ruled out With concern for SBP, steroids for alcoholic hepatitis had initially not been started but were initiated 03/23. Pt intolerant to prednisolone d/t taste, prednisone alternative started. Typical therapy 28 days, will require outpatient follow-up for taper.  Po furosemide increased to bid, added potassium daily supplementation Had increased spironolactone to 100 mg but worsening hyponatremia, reduced dose and sodium improved, consider gradual titration up outpatient  Leukocytosis w/ increased neutrophils  Likely d/t steroids but will w/u for infection  Checking CXR --> L pneumonia difficult to explude but given lack of respiratory symptoms pneumonia seems unlikely If true fever, would repeat BCx   Cardiomegaly Pulmonary vascular congestion  Likely d/t Cardiohepatic syndrome  Echocardiogram w/ EF 60-65% no RWMA and no diastolic df, IV Albumin + Lasix as needed  Po lasix increased today, monitor response  Monitor potassium  Strict I&O  Alcohol dependence with withdrawal  IV thiamine to prevent Wernicke's encephalopathy CIWA protocol   Hyponatremia Likely will be chronic d/t liver failure Brief worsening likely d/t spironolactone Reduced spiro dose Fluid restrict to 1800 mL/day, could restrict further depending on response to lasix increase  Monitor labs  Hypokalemia  Monitor closely on Lasix  Starte po supplementation Replace as needed Monitor labs  Hypocalcemia Replace as needed Monitor labs    DVT prophylaxis: SCD Pertinent IV fluids/nutrition: no continuous IV fluids  Central lines / invasive devices: none   Code Status: FULL CODE  Current Admission Status: inpatient  TOC needs / Dispo plan: none at this time Barriers to discharge / significant pending items: increasing lasix today, monitor for toelrance, increased WBC likely d/t steroids rather than  infection, will trend along w/ VS. If no further fevers and labs appropriate on po meds may be able to d/c in next 1-2 days, ensure outpatient GI and PCP f/u (working on [employer])              Subjective / Brief ROS:  Patient reports pain is improving, (+)fever last night (subjective), no cough/sob, no urianry complaints. 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 We discussed that he is not a candidate for liver transplant and will need close outpatient follow-up, he verbalized understanding.   Family Communication: none at this time.     Objective Findings:  Vitals:   05/18/22 1617 05/18/22 2205 05/19/22 0448 05/19/22 0819  BP: (!) 142/69 128/64 126/64 127/64  Pulse: 74 79 82 93  Resp: 16 18 18 18   Temp: 98.8 F (37.1 C) 98.1 F (36.7 C) 98.1 F (36.7 C) 98.6 F (37 C)  TempSrc:  Oral Oral   SpO2: 94% 98% 97% 96%  Weight:      Height:        Intake/Output Summary (Last 24 hours) at 05/19/2022 1529 Last data filed at 05/19/2022 1400 Gross per 24 hour  Intake 480 ml  Output --  Net 480 ml   Filed Weights   05/11/22 0935 05/15/22 0512 05/16/22 0416  Weight: 122.9 kg 112.6 kg 116.4 kg    Examination:  Physical Exam Constitutional:      General: He is not in acute distress.    Appearance: He is obese. He is ill-appearing. He is not toxic-appearing.  Pulmonary:     Effort: Pulmonary effort is normal.  Abdominal:  General: Abdomen is protuberant. There is no distension.  Skin:    Coloration: Skin is jaundiced.          Scheduled Medications:   amLODipine  5 mg Oral Daily   docusate sodium  100 mg Oral BID   enoxaparin (LOVENOX) injection  0.5 mg/kg Subcutaneous [id number]   folic acid  1 mg Oral Daily   furosemide  40 mg Oral BID WC   lactulose  15 g Oral BID   multivitamin with minerals  1 tablet Oral Daily   potassium chloride  40 mEq Oral Daily   predniSONE  40 mg Oral Q breakfast   senna  1 tablet Oral BID   spironolactone  12.5 mg Oral Daily    thiamine  100 mg Oral Daily   Or   thiamine  100 mg Intravenous Daily    Continuous Infusions:  sodium chloride Stopped (05/17/22 0357)   sodium chloride Stopped (05/17/22 1050)    PRN Medications:  sodium chloride, sodium chloride, ondansetron **OR** ondansetron (ZOFRAN) IV, mouth rinse, oxyCODONE  Antimicrobials from admission:  Anti-infectives (From admission, onward)    Start     Dose/Rate Route Frequency Ordered Stop   05/18/22 0800  ciprofloxacin (CIPRO) tablet 500 mg  Status:  Discontinued        500 mg Oral Daily with breakfast 05/16/22 0749 05/17/22 1214   05/16/22 1000  metroNIDAZOLE (FLAGYL) tablet 500 mg  Status:  Discontinued        500 mg Oral Every 12 hours 05/16/22 0716 05/17/22 1118   05/12/22 1100  cefTRIAXone (ROCEPHIN) 2 g in sodium chloride 0.9 % 100 mL IVPB        2 g 200 mL/hr over 30 Minutes Intravenous Every 24 hours 05/11/22 1446 05/17/22 1934   05/11/22 2200  metroNIDAZOLE (FLAGYL) IVPB 500 mg  Status:  Discontinued        500 mg 100 mL/hr over 60 Minutes Intravenous Every 8 hours 05/11/22 1446 05/11/22 1454   05/11/22 2200  metroNIDAZOLE (FLAGYL) IVPB 500 mg  Status:  Discontinued        500 mg 100 mL/hr over 60 Minutes Intravenous Every 12 hours 05/11/22 1454 05/16/22 0716   05/11/22 1500  cefTRIAXone (ROCEPHIN) 1 g in sodium chloride 0.9 % 100 mL IVPB  Status:  Discontinued        1 g 200 mL/hr over 30 Minutes Intravenous  Once 05/11/22 1449 05/12/22 1607   05/11/22 1430  metroNIDAZOLE (FLAGYL) IVPB 500 mg        500 mg 100 mL/hr over 60 Minutes Intravenous  Once 05/11/22 1421 05/11/22 1530   05/11/22 1030  cefTRIAXone (ROCEPHIN) 1 g in sodium chloride 0.9 % 100 mL IVPB        1 g 200 mL/hr over 30 Minutes Intravenous  Once 05/11/22 1024 05/11/22 1220           Data Reviewed:  I have personally reviewed the following...  CBC: Recent Labs  Lab 05/13/22 0543 05/14/22 0637 05/16/22 1031 05/19/22 0513  WBC 15.7* 14.6* 16.0* 18.4*   18.0*  NEUTROABS  --   --   --  15.7*  HGB 9.7* 10.1* 10.5* 10.4*  10.3*  HCT 27.5* 28.5* 30.2* 29.6*  29.3*  MCV 104.6* 106.3* 107.1* 108.8*  107.3*  PLT 163 156 171 223  [id number]   Basic Metabolic Panel: Recent Labs  Lab 05/13/22 0543 05/13/22 1538 05/15/22 0630 05/16/22 1031 05/17/22 0544 05/18/22 0426 05/19/22 0513  NA 128*   < >  130* 127* 123* 126* 128*  K 2.6*   < > 3.2* 3.0* 3.7 3.4* 3.3*  CL 90*   < > 86* 89* 90* 87* 92*  CO2 28   < > 30 27 27 28 28   GLUCOSE 95   < > 100* 146* 137* 125* 128*  BUN 13   < > 11 12 17  21* 22*  CREATININE 0.64   < > 0.62 0.71 0.77 0.69 0.60*  CALCIUM 7.3*   < > 7.8* 7.7* 7.8* 8.4* 7.9*  MG 1.6*  --   --  2.0  --   --   --   PHOS 3.7  --   --   --   --   --   --    < > = values in this interval not displayed.   GFR: Estimated Creatinine Clearance: 183.1 mL/min (A) (by C-G formula based on SCr of 0.6 mg/dL (L)). Liver Function Tests: Recent Labs  Lab 05/14/22 0637 05/16/22 1031 05/17/22 0821 05/18/22 0426 05/19/22 0513  AST 116* 111* 89* 75* 84*  ALT 20 20 22 24 25   ALKPHOS 92 93 86 93 100  BILITOT 27.5* 29.3* 26.1* 25.6* 24.1*  PROT 8.3* 8.5* 8.0 7.8 7.9  ALBUMIN 2.0* 2.1* 1.9* 1.9* 1.9*   No results for input(s): "LIPASE", "AMYLASE" in the last 168 hours.  No results for input(s): "AMMONIA" in the last 168 hours. Coagulation Profile: Recent Labs  Lab 05/14/22 0637  INR 3.2*   Cardiac Enzymes: No results for input(s): "CKTOTAL", "CKMB", "CKMBINDEX", "TROPONINI" in the last 168 hours. BNP (last 3 results) No results for input(s): "PROBNP" in the last 8760 hours. HbA1C: No results for input(s): "HGBA1C" in the last 72 hours. CBG: No results for input(s): "GLUCAP" in the last 168 hours. Lipid Profile: No results for input(s): "CHOL", "HDL", "LDLCALC", "TRIG", "CHOLHDL", "LDLDIRECT" in the last 72 hours. Thyroid Function Tests: No results for input(s): "TSH", "T4TOTAL", "FREET4", "T3FREE", "THYROIDAB" in the last 72  hours. Anemia Panel: No results for input(s): "VITAMINB12", "FOLATE", "FERRITIN", "TIBC", "IRON", "RETICCTPCT" in the last 72 hours. Most Recent Urinalysis On File:     Component Value Date/Time   COLORURINE AMBER (A) 05/11/2022 1447   APPEARANCEUR CLEAR (A) 05/11/2022 1447   LABSPEC 1.033 (H) 05/11/2022 1447   PHURINE 7.0 05/11/2022 1447   GLUCOSEU NEGATIVE 05/11/2022 1447   HGBUR SMALL (A) 05/11/2022 1447   BILIRUBINUR MODERATE (A) 05/11/2022 1447   KETONESUR NEGATIVE 05/11/2022 1447   PROTEINUR NEGATIVE 05/11/2022 1447   NITRITE NEGATIVE 05/11/2022 1447   LEUKOCYTESUR NEGATIVE 05/11/2022 1447   Sepsis Labs: @LABRCNTIP (procalcitonin:4,lacticidven:4) Microbiology: Recent Results (from the past 240 hour(s))  Varicella-zoster by PCR     Status: None   Collection Time: 05/14/22 11:40 AM   Specimen: Blood  Result Value Ref Range Status   Varicella-Zoster, PCR Negative Negative Final    Comment: (NOTE) No Varicella Zoster Virus DNA detected. This test was developed and its performance characteristics determined by [facility].  It has not been cleared or approved by the Food and Drug Administration.  The FDA has determined that such clearance or approval is not necessary. Performed At: [facility] [place], [place] [id number] Rush Farmer MD [id number]       Radiology Studies last 3 days: [facility] Chest Port 1 View  Result Date: 05/19/2022 CLINICAL DATA:  Prior abnormal chest x-ray, history of cough EXAM: PORTABLE CHEST 1 VIEW COMPARISON:  05/15/2022, 11/21/2019 FINDINGS: Single frontal view of the chest demonstrates persistent  enlargement of the cardiac silhouette. There is chronic vascular congestion, with persistent left perihilar airspace disease. No effusion or pneumothorax. No acute bony abnormalities. IMPRESSION: 1. Constellation of findings most suggestive of congestive heart failure and mild asymmetric edema. Underlying left-sided pneumonia would be  difficult to exclude. Continued follow-up after appropriate medical management is recommended, with PA and lateral chest x-ray recommended. Electronically Signed   By: Randa Ngo M.D.   On: 05/19/2022 13:33   US Abdomen Limited  Result Date: 05/18/2022 CLINICAL DATA:  [id number] Hepatic failure ([place]) [id number] EXAM: LIMITED ABDOMEN ULTRASOUND FOR ASCITES TECHNIQUE: Limited ultrasound survey for ascites was performed in all four abdominal quadrants. COMPARISON:  None Available. FINDINGS: Limited sonographic exam of the abdomen was performed for fluid assessment. Images demonstrate small amount of perihepatic ascites. The patient declined paracentesis. IMPRESSION: Small volume perihepatic ascites. The patient declined paracentesis. Electronically Signed   By: Albin Felling M.D.   On: 05/18/2022 12:12             LOS: 8 days       Emeterio Reeve, DO [facility] 05/19/2022, 3:29 PM    Dictation software may have been used to generate the above note. Typos may occur and escape review in typed/dictated notes. Please contact Dr Sheppard Coil directly for clarity if needed.  Staff may message me via secure chat in [place]  but this may not receive an immediate response,  please page me for urgent matters!  If 7PM-7AM, please contact night coverage [email or web address]

## 2022-05-20 DIAGNOSIS — I5032 Chronic diastolic (congestive) heart failure: Secondary | ICD-10-CM

## 2022-05-20 DIAGNOSIS — K7682 Hepatic encephalopathy: Secondary | ICD-10-CM

## 2022-05-20 DIAGNOSIS — K7031 Alcoholic cirrhosis of liver with ascites: Secondary | ICD-10-CM

## 2022-05-20 DIAGNOSIS — E871 Hypo-osmolality and hyponatremia: Secondary | ICD-10-CM | POA: Insufficient documentation

## 2022-05-20 LAB — CBC WITH DIFFERENTIAL/PLATELET
Abs Immature Granulocytes: 0.17 10*3/uL — ABNORMAL HIGH (ref 0.00–0.07)
Basophils Absolute: 0 10*3/uL (ref 0.0–0.1)
Basophils Relative: 0 %
Eosinophils Absolute: 0.1 10*3/uL (ref 0.0–0.5)
Eosinophils Relative: 0 %
HCT: 28.6 % — ABNORMAL LOW (ref 39.0–52.0)
Hemoglobin: 10.1 g/dL — ABNORMAL LOW (ref 13.0–17.0)
Immature Granulocytes: 1 %
Lymphocytes Relative: 7 %
Lymphs Abs: 1.3 10*3/uL (ref 0.7–4.0)
MCH: 38.1 pg — ABNORMAL HIGH (ref 26.0–34.0)
MCHC: 35.3 g/dL (ref 30.0–36.0)
MCV: 107.9 fL — ABNORMAL HIGH (ref 80.0–100.0)
Monocytes Absolute: 1.3 10*3/uL — ABNORMAL HIGH (ref 0.1–1.0)
Monocytes Relative: 7 %
Neutro Abs: 16.5 10*3/uL — ABNORMAL HIGH (ref 1.7–7.7)
Neutrophils Relative %: 85 %
Platelets: 207 10*3/uL (ref 150–400)
RBC: 2.65 MIL/uL — ABNORMAL LOW (ref 4.22–5.81)
RDW: 18.6 % — ABNORMAL HIGH (ref 11.5–15.5)
WBC: 19.4 10*3/uL — ABNORMAL HIGH (ref 4.0–10.5)
nRBC: 0 % (ref 0.0–0.2)

## 2022-05-20 LAB — COMPREHENSIVE METABOLIC PANEL
ALT: 25 U/L (ref 0–44)
AST: 107 U/L — ABNORMAL HIGH (ref 15–41)
Albumin: 2 g/dL — ABNORMAL LOW (ref 3.5–5.0)
Alkaline Phosphatase: 101 U/L (ref 38–126)
Anion gap: 8 (ref 5–15)
BUN: 26 mg/dL — ABNORMAL HIGH (ref 6–20)
CO2: 29 mmol/L (ref 22–32)
Calcium: 7.7 mg/dL — ABNORMAL LOW (ref 8.9–10.3)
Chloride: 89 mmol/L — ABNORMAL LOW (ref 98–111)
Creatinine, Ser: 0.69 mg/dL (ref 0.61–1.24)
GFR, Estimated: >60 mL/min (ref >60)
Glucose, Bld: 138 mg/dL — ABNORMAL HIGH (ref 70–99)
Potassium: 3.5 mmol/L (ref 3.5–5.1)
Sodium: 126 mmol/L — ABNORMAL LOW (ref 135–145)
Total Bilirubin: 22.9 mg/dL (ref 0.3–1.2)
Total Protein: 7.6 g/dL (ref 6.5–8.1)

## 2022-05-20 LAB — ALPHA-1-ANTITRYPSIN: A-1 Antitrypsin, Ser: 182 mg/dL — ABNORMAL HIGH (ref 95–164)

## 2022-05-20 LAB — CERULOPLASMIN: Ceruloplasmin: 23.4 mg/dL (ref 16.0–31.0)

## 2022-05-20 MED ORDER — POTASSIUM CHLORIDE CRYS ER 20 MEQ PO TBCR
20.0000 meq | EXTENDED_RELEASE_TABLET | Freq: Every day | ORAL | Status: DC
  Administered 2022-05-20: 20 meq via ORAL
  Filled 2022-05-20: qty 1

## 2022-05-20 MED ORDER — SPIRONOLACTONE 25 MG PO TABS
50.0000 mg | ORAL_TABLET | Freq: Every day | ORAL | Status: DC
  Administered 2022-05-20: 50 mg via ORAL
  Filled 2022-05-20: qty 2

## 2022-05-20 NOTE — Assessment & Plan Note (Addendum)
Bilirubin still very high at 24.6.  This may improve over time.

## 2022-05-20 NOTE — Assessment & Plan Note (Addendum)
With spironolactone at 100 mg daily.  Replace potassium today.

## 2022-05-20 NOTE — Assessment & Plan Note (Signed)
BMI 35.79

## 2022-05-20 NOTE — Assessment & Plan Note (Signed)
Likely secondary to liver cirrhosis and anasarca.

## 2022-05-20 NOTE — Assessment & Plan Note (Addendum)
Continue prednisone but with his leg pain and will taper down to 30 mg daily.  White blood cell count likely elevated with steroids but could also be infection.  Started empiric Rocephin on 3/29 since the patient had a low temperature.Marland Kitchen

## 2022-05-20 NOTE — Assessment & Plan Note (Addendum)
Continue lactulose for ammonia level of 70.  Spoke with patient about the goal bowel movement is 3/day.  Ammonia level 38 today.

## 2022-05-20 NOTE — Assessment & Plan Note (Signed)
Likely secondary to alcohol and cirrhosis

## 2022-05-20 NOTE — Assessment & Plan Note (Addendum)
Corrected calcium normal range.

## 2022-05-20 NOTE — Progress Notes (Signed)
Progress Note   Patient: Cameron Santiago [id number] DOB: 1995/11/19 DOA: 05/11/2022     9 DOS: the patient was seen and examined on 05/20/2022   Brief hospital course: This 27 y.o. male with PMH significant for EtOH liver cirrhosis, ongoing EtOH use, Essential hypertension, presented in the ED with abdominal distention associated with abdominal pain for last few days.  Patient drinks  ETOH regularly, 1 pint of hard liquor every day.  Patient denies any nausea,  vomiting. Patient also reports subjective fever at night with chills, He denies any GI bleeding, dizziness, weakness, urinary symptoms, denies any recent travel or  URI symptoms.  03/18: ED workup: Sodium 131, potassium 2.5, anion gap 13, magnesium 1.8, alkaline phosphatase 107, albumin 2.1, lipase 75, AST 169, ALT 21, ALT 9.4, total bilirubin 28.4, total protein 9.4, WBC 14.4, hemoglobin 10.9, hematocrit 31.2, MCV 104.7, platelet 192, influenza negative, COVID-negative, RSV negative, UA: Ketones negative, LE negative, nitrate negative. CTA/P:  Hepatosplenomegaly, Hepatic cirrhosis, Distended gallbladder, Perigastric and perisplenic varices. Small ascites. Admitted for treatment and monitoring EtOH withdrawal  03/19:  GI saw patient. NPO 03/20: Advance to CLD. Pt is concerned about EtOH w/drawal, remains on CIWA protocol  03/21: liver doppler (+)cirrhosis and portal HTN, advancing to full liquids.  03/22: advancing diet. (+)cough and rales, CXR showing cardiomegaly and vascular congestion --> lasix + albumin, Echo to eval further.  03/23: no concerns on Echocardiogram w/ EF 60-65% no RWMA and no diastolic df, diuresed well yesterday will repeat Lasix today. Dr Allen Norris w/ GI checked up on previous paracentesis labs and no SBP, ok to start steroids, he will be placing these orders. Given deterioration on labs / increased MELD score, Dr Allen Norris also spoke w/ [facility] transplant team - given previous pancreatitis treatment, patient is not a transplant  candidate.  03/24: hyponatremia worse to 123, reduced spironolactone. GI following. D/c abx. Bili slightly improved on steroids. GI and hospitalist teams have discussed that he is not a transplant candidate - see GI note from today.  03/25: sodium improved to 126. Bili slightly improved as well to 25. Continuing steroids. Per GI, "advanced liver cirrhosis likely from alcohol abuse. His other labs did not show any cause for his cirrhosis. The patient should continue with supportive care. Nothing further to do from a GI point of view at this time." 03/26: bilirubin down to 24, sodium 128, WBC increased to 18 w/ neutrophils, likely d/t steroids however pt reports subjective fever/chills, no cough, abdominal pain is stable. Getting CXR given some concern for infiltrate on previous but no obvious pneumonia, low threshold for restarting ceftriaxone, he declined paracentesis but may need to revisit this. Will leave on steroids for now, low suspicion for infection. Increased lasix. 3/27.  Patient hesitant about going home.  I will uptitrate spironolactone so hopefully we can get off potassium supplementation.  Declined another paracentesis.  Procedures: 05/11/22 Paracentesis              Assessment and Plan: * Alcoholic hepatitis with ascites Continue prednisone and likely will need a long taper.  White blood cell count likely elevated with steroids.  Acute hepatic encephalopathy (HCC) Continue lactulose for ammonia level of 70.  Spoke with patient about the goal bowel movement is 3/day.  Alcoholic cirrhosis ([place]) Advised patient that he must stop drinking in order to be a candidate for a liver transplant.  Elevated bilirubin Bilirubin still very high at 22.9.  This may improve over time.  Alcohol dependence with withdrawal (HCC) No  current signs of any withdrawal.  Hypokalemia Titrate up spironolactone so hopefully we can titrate off potassium supplementation.  Hypocalcemia Corrected  calcium 9.3 based on albumin and calcium.  Hyponatremia Likely secondary to alcohol and cirrhosis  Chronic diastolic CHF (congestive heart failure) ([place]) Likely secondary to liver cirrhosis and anasarca.  Obesity (BMI 30-39.9) BMI 35.79        Subjective: Patient feeling okay.  Had 3 episodes of diarrhea this morning.  Does not want to cut back on the lactulose.  Refused paracentesis again today.  Physical Exam: Vitals:   05/19/22 2134 05/20/22 0512 05/20/22 0745 05/20/22 1228  BP: (!) 128/58 123/60 126/67 127/63  Pulse: 84 83 84 96  Resp: 20 20 16    Temp: 98.3 F (36.8 C) 98.6 F (37 C) 98.1 F (36.7 C)   TempSrc: Oral     SpO2: 99% 97% 97%   Weight:      Height:       Physical Exam HENT:     Head: Normocephalic.     Mouth/Throat:     Pharynx: No oropharyngeal exudate.  Eyes:     General: Lids are normal.     Comments: Conjunctiva icteric  Cardiovascular:     Rate and Rhythm: Normal rate and regular rhythm.     Heart sounds: Normal heart sounds, S1 normal and S2 normal.  Pulmonary:     Breath sounds: Examination of the right-lower field reveals decreased breath sounds. Examination of the left-lower field reveals decreased breath sounds. Decreased breath sounds present. No wheezing, rhonchi or rales.  Abdominal:     General: There is distension.     Palpations: Abdomen is soft.     Tenderness: There is no abdominal tenderness.  Musculoskeletal:     Right lower leg: Swelling present.     Left lower leg: Swelling present.  Skin:    General: Skin is warm.     Coloration: Skin is jaundiced.  Neurological:     Mental Status: He is alert and oriented to person, place, and time.     Data Reviewed: Sodium 126, potassium 3.5, creatinine 0.69, total bilirubin 22.9  Disposition: Status is: Inpatient Remains inpatient appropriate because: Patient has evidence of ongoing home.  Concerned about his laboratory abnormalities.  I told him that this is going to take a  long time to improve.  I will adjust up his spironolactone so hopefully we can get rid of the potassium supplementation.  Planned Discharge Destination: Home    Time spent: 28 minutes  Author: Loletha Grayer, MD 05/20/2022 2:23 PM  For on call review [email or web address].

## 2022-05-20 NOTE — Assessment & Plan Note (Addendum)
Advised patient that he must stop drinking in order to be a candidate for a liver transplant.  Calculated Childs class Pugh score of 13 which would be class C which would be a 65% mortality within 2 years.  Palliative care consultation.

## 2022-05-20 NOTE — Assessment & Plan Note (Signed)
No current signs of any withdrawal.

## 2022-05-21 DIAGNOSIS — K7031 Alcoholic cirrhosis of liver with ascites: Secondary | ICD-10-CM

## 2022-05-21 DIAGNOSIS — I5032 Chronic diastolic (congestive) heart failure: Secondary | ICD-10-CM

## 2022-05-21 DIAGNOSIS — E871 Hypo-osmolality and hyponatremia: Secondary | ICD-10-CM

## 2022-05-21 DIAGNOSIS — K7682 Hepatic encephalopathy: Secondary | ICD-10-CM

## 2022-05-21 LAB — PROTIME-INR
INR: 2.4 — ABNORMAL HIGH (ref 0.8–1.2)
Prothrombin Time: 25.8 seconds — ABNORMAL HIGH (ref 11.4–15.2)

## 2022-05-21 MED ORDER — SPIRONOLACTONE 25 MG PO TABS
100.0000 mg | ORAL_TABLET | Freq: Every day | ORAL | Status: DC
  Administered 2022-05-21 – 2022-06-02 (×13): 100 mg via ORAL
  Filled 2022-05-21 (×13): qty 4

## 2022-05-21 NOTE — Progress Notes (Signed)
The patient has ruled out for [name] or other common causes of his cirrhosis. Has been better on steroids. Needs to be on a total of 28 days of steroids when discharged and then a taper. His lillie score showed improvement. He should follow up with his primary GI doctor (Dr. Marius Ditch)  after discharge for steroid taper..  I will sign off.  Please call if any further GI concerns or questions.  We would like to thank you for the opportunity to participate in the care of Cameron Santiago.

## 2022-05-21 NOTE — Progress Notes (Signed)
Progress Note   Patient: Cameron Santiago [id number] DOB: 05-19-1995 DOA: 05/11/2022     10 DOS: the patient was seen and examined on 05/21/2022   Brief hospital course: This 27 y.o. male with PMH significant for EtOH liver cirrhosis, ongoing EtOH use, Essential hypertension, presented in the ED with abdominal distention associated with abdominal pain for last few days.  Patient drinks  ETOH regularly, 1 pint of hard liquor every day.  Patient denies any nausea,  vomiting. Patient also reports subjective fever at night with chills, He denies any GI bleeding, dizziness, weakness, urinary symptoms, denies any recent travel or  URI symptoms.  03/18: ED workup: Sodium 131, potassium 2.5, anion gap 13, magnesium 1.8, alkaline phosphatase 107, albumin 2.1, lipase 75, AST 169, ALT 21, ALT 9.4, total bilirubin 28.4, total protein 9.4, WBC 14.4, hemoglobin 10.9, hematocrit 31.2, MCV 104.7, platelet 192, influenza negative, COVID-negative, RSV negative, UA: Ketones negative, LE negative, nitrate negative. CTA/P:  Hepatosplenomegaly, Hepatic cirrhosis, Distended gallbladder, Perigastric and perisplenic varices. Small ascites. Admitted for treatment and monitoring EtOH withdrawal  03/19:  GI saw patient. NPO 03/20: Advance to CLD. Pt is concerned about EtOH w/drawal, remains on CIWA protocol  03/21: liver doppler (+)cirrhosis and portal HTN, advancing to full liquids.  03/22: advancing diet. (+)cough and rales, CXR showing cardiomegaly and vascular congestion --> lasix + albumin, Echo to eval further.  03/23: no concerns on Echocardiogram w/ EF 60-65% no RWMA and no diastolic df, diuresed well yesterday will repeat Lasix today. Dr Allen Norris w/ GI checked up on previous paracentesis labs and no SBP, ok to start steroids, he will be placing these orders. Given deterioration on labs / increased MELD score, Dr Allen Norris also spoke w/ [facility] transplant team - given previous pancreatitis treatment, patient is not a transplant  candidate.  03/24: hyponatremia worse to 123, reduced spironolactone. GI following. D/c abx. Bili slightly improved on steroids. GI and hospitalist teams have discussed that he is not a transplant candidate - see GI note from today.  03/25: sodium improved to 126. Bili slightly improved as well to 25. Continuing steroids. Per GI, "advanced liver cirrhosis likely from alcohol abuse. His other labs did not show any cause for his cirrhosis. The patient should continue with supportive care. Nothing further to do from a GI point of view at this time." 03/26: bilirubin down to 24, sodium 128, WBC increased to 18 w/ neutrophils, likely d/t steroids however pt reports subjective fever/chills, no cough, abdominal pain is stable. Getting CXR given some concern for infiltrate on previous but no obvious pneumonia, low threshold for restarting ceftriaxone, he declined paracentesis but may need to revisit this. Will leave on steroids for now, low suspicion for infection. Increased lasix. 3/27.  Patient hesitant about going home.  I will uptitrate spironolactone so hopefully we can get off potassium supplementation.  Declined another paracentesis. 3/28.  Patient does not feel well today.  Did not sleep well.  Declined paracentesis.  Procedures: 05/11/22 Paracentesis              Assessment and Plan: * Alcoholic hepatitis with ascites Continue prednisone and likely will need a long taper.  White blood cell count likely elevated with steroids.  Acute hepatic encephalopathy (HCC) Continue lactulose for ammonia level of 70.  Spoke with patient about the goal bowel movement is 3/day.  Alcoholic cirrhosis ([place]) Advised patient that he must stop drinking in order to be a candidate for a liver transplant.  Elevated bilirubin Bilirubin still very  high at 22.9.  This may improve over time.  Alcohol dependence with withdrawal (HCC) No current signs of any withdrawal.  Hypokalemia Titrate spironolactone  up to 100 mg daily so hopefully will not need potassium supplementation.  Hypocalcemia Corrected calcium 9.3 based on albumin and calcium.  Hyponatremia Likely secondary to alcohol and cirrhosis  Chronic diastolic CHF (congestive heart failure) ([place]) Likely secondary to liver cirrhosis and anasarca.  Obesity (BMI 30-39.9) BMI 35.79        Subjective: Patient not feeling well today.  Did not sleep very well last night.  Feeling a little lethargic.  Admitted with alcoholic hepatitis.  Physical Exam: Vitals:   05/20/22 1630 05/20/22 2019 05/21/22 0502 05/21/22 0736  BP: (!) 140/62 (!) 120/58 136/69 130/70  Pulse: 98 85 82 80  Resp: 17 18 20 16   Temp: 98.2 F (36.8 C) 98.4 F (36.9 C) 98 F (36.7 C) 98.2 F (36.8 C)  TempSrc:  Oral Oral Oral  SpO2: 98% 96% 98% 98%  Weight:      Height:       Physical Exam HENT:     Head: Normocephalic.     Mouth/Throat:     Pharynx: No oropharyngeal exudate.  Eyes:     General: Lids are normal.     Comments: Conjunctiva icteric  Cardiovascular:     Rate and Rhythm: Normal rate and regular rhythm.     Heart sounds: Normal heart sounds, S1 normal and S2 normal.  Pulmonary:     Breath sounds: Examination of the right-lower field reveals decreased breath sounds. Examination of the left-lower field reveals decreased breath sounds. Decreased breath sounds present. No wheezing, rhonchi or rales.  Abdominal:     General: There is distension.     Palpations: Abdomen is soft.     Tenderness: There is no abdominal tenderness.  Musculoskeletal:     Right lower leg: Swelling present.     Left lower leg: Swelling present.  Skin:    General: Skin is warm.     Coloration: Skin is jaundiced.  Neurological:     Mental Status: He is alert and oriented to person, place, and time.     Data Reviewed: INR 2.4  Family Communication: Refused  Disposition: Status is: Inpatient Remains inpatient appropriate because: Patient not feeling well  today.  Will reevaluate tomorrow.  Again declined repeat paracentesis.  Evaluating on a daily basis on when to go home.  Planned Discharge Destination: Home    Time spent: 28 minutes  Author: Loletha Grayer, MD 05/21/2022 2:54 PM  For on call review [email or web address].

## 2022-05-22 ENCOUNTER — Inpatient Hospital Stay: Payer: Medicaid Other

## 2022-05-22 DIAGNOSIS — M79652 Pain in left thigh: Secondary | ICD-10-CM

## 2022-05-22 DIAGNOSIS — D72829 Elevated white blood cell count, unspecified: Secondary | ICD-10-CM

## 2022-05-22 LAB — COMPREHENSIVE METABOLIC PANEL
ALT: 45 U/L — ABNORMAL HIGH (ref 0–44)
AST: 136 U/L — ABNORMAL HIGH (ref 15–41)
Albumin: 2.2 g/dL — ABNORMAL LOW (ref 3.5–5.0)
Alkaline Phosphatase: 127 U/L — ABNORMAL HIGH (ref 38–126)
Anion gap: 10 (ref 5–15)
BUN: 27 mg/dL — ABNORMAL HIGH (ref 6–20)
CO2: 29 mmol/L (ref 22–32)
Calcium: 7.9 mg/dL — ABNORMAL LOW (ref 8.9–10.3)
Chloride: 88 mmol/L — ABNORMAL LOW (ref 98–111)
Creatinine, Ser: 0.64 mg/dL (ref 0.61–1.24)
GFR, Estimated: >60 mL/min (ref >60)
Glucose, Bld: 108 mg/dL — ABNORMAL HIGH (ref 70–99)
Potassium: 3.6 mmol/L (ref 3.5–5.1)
Sodium: 127 mmol/L — ABNORMAL LOW (ref 135–145)
Total Bilirubin: 24.6 mg/dL (ref 0.3–1.2)
Total Protein: 8.1 g/dL (ref 6.5–8.1)

## 2022-05-22 LAB — CBC
HCT: 28.5 % — ABNORMAL LOW (ref 39.0–52.0)
Hemoglobin: 10.3 g/dL — ABNORMAL LOW (ref 13.0–17.0)
MCH: 38.9 pg — ABNORMAL HIGH (ref 26.0–34.0)
MCHC: 36.1 g/dL — ABNORMAL HIGH (ref 30.0–36.0)
MCV: 107.5 fL — ABNORMAL HIGH (ref 80.0–100.0)
Platelets: 250 10*3/uL (ref 150–400)
RBC: 2.65 MIL/uL — ABNORMAL LOW (ref 4.22–5.81)
RDW: 18.1 % — ABNORMAL HIGH (ref 11.5–15.5)
WBC: 29.1 10*3/uL — ABNORMAL HIGH (ref 4.0–10.5)
nRBC: 0.1 % (ref 0.0–0.2)

## 2022-05-22 LAB — CK: Total CK: 34 U/L — ABNORMAL LOW (ref 49–397)

## 2022-05-22 LAB — URIC ACID: Uric Acid, Serum: 4.2 mg/dL (ref 3.7–8.6)

## 2022-05-22 MED ORDER — SODIUM CHLORIDE 0.9 % IV SOLN
1.0000 g | INTRAVENOUS | Status: DC
  Administered 2022-05-22 – 2022-05-28 (×7): 1 g via INTRAVENOUS
  Filled 2022-05-22: qty 1
  Filled 2022-05-22: qty 10
  Filled 2022-05-22 (×5): qty 1
  Filled 2022-05-22: qty 10

## 2022-05-22 NOTE — Assessment & Plan Note (Addendum)
MRI left leg showed a large fluid collection.  Ultrasound negative for DVT.  CK and uric acid normal.  Interventional radiology did a drainage on 4/1.Cameron Santiago  They felt it was most likely hematoma.  Red blood cells 11,413 but white blood cells also elevated at 2817.  Lovenox discontinued on 3/31.  Heating pad.  Pain control with oral and IV medications.  Will give vitamin K orally for 3 days.

## 2022-05-22 NOTE — Assessment & Plan Note (Signed)
Worsening leukocytosis.  With temperature at 95 will start empiric antibiotics.  Patient declined MRI of the abdomen or MRI of the leg.

## 2022-05-22 NOTE — Progress Notes (Signed)
Progress Note   Patient: Cameron Santiago [id number] DOB: [birth year] DOA: 05/11/2022     11 DOS: the patient was seen and examined on 05/22/2022   Brief hospital course: This 27 y.o. male with PMH significant for EtOH liver cirrhosis, ongoing EtOH use, Essential hypertension, presented in the ED with abdominal distention associated with abdominal pain for last few days.  Patient drinks  ETOH regularly, 1 pint of hard liquor every day.  Patient denies any nausea,  vomiting. Patient also reports subjective fever at night with chills, He denies any GI bleeding, dizziness, weakness, urinary symptoms, denies any recent travel or  URI symptoms.  03/18: ED workup: Sodium 131, potassium 2.5, anion gap 13, magnesium 1.8, alkaline phosphatase 107, albumin 2.1, lipase 75, AST 169, ALT 21, ALT 9.4, total bilirubin 28.4, total protein 9.4, WBC 14.4, hemoglobin 10.9, hematocrit 31.2, MCV 104.7, platelet 192, influenza negative, COVID-negative, RSV negative, UA: Ketones negative, LE negative, nitrate negative. CTA/P:  Hepatosplenomegaly, Hepatic cirrhosis, Distended gallbladder, Perigastric and perisplenic varices. Small ascites. Admitted for treatment and monitoring EtOH withdrawal  03/19:  GI saw patient. NPO 03/20: Advance to CLD. Pt is concerned about EtOH w/drawal, remains on CIWA protocol  03/21: liver doppler (+)cirrhosis and portal HTN, advancing to full liquids.  03/22: advancing diet. (+)cough and rales, CXR showing cardiomegaly and vascular congestion --> lasix + albumin, Echo to eval further.  03/23: no concerns on Echocardiogram w/ EF 60-65% no RWMA and no diastolic df, diuresed well yesterday will repeat Lasix today. Dr Allen Norris w/ GI checked up on previous paracentesis labs and no SBP, ok to start steroids, he will be placing these orders. Given deterioration on labs / increased MELD score, Dr Allen Norris also spoke w/ [facility] transplant team - given previous pancreatitis treatment, patient is not a transplant  candidate.  03/24: hyponatremia worse to 123, reduced spironolactone. GI following. D/c abx. Bili slightly improved on steroids. GI and hospitalist teams have discussed that he is not a transplant candidate - see GI note from today.  03/25: sodium improved to 126. Bili slightly improved as well to 25. Continuing steroids. Per GI, "advanced liver cirrhosis likely from alcohol abuse. His other labs did not show any cause for his cirrhosis. The patient should continue with supportive care. Nothing further to do from a GI point of view at this time." 03/26: bilirubin down to 24, sodium 128, WBC increased to 18 w/ neutrophils, likely d/t steroids however pt reports subjective fever/chills, no cough, abdominal pain is stable. Getting CXR given some concern for infiltrate on previous but no obvious pneumonia, low threshold for restarting ceftriaxone, he declined paracentesis but may need to revisit this. Will leave on steroids for now, low suspicion for infection. Increased lasix. 3/27.  Patient hesitant about going home.  I will uptitrate spironolactone so hopefully we can get off potassium supplementation.  Declined another paracentesis. 3/28.  Patient does not feel well today.  Did not sleep well.  Declined paracentesis. 3/29.  Patient feels worse today with nausea vomiting, abdominal pain.  Patient also has left leg pain left lateral thigh.  Patient declined paracentesis.  Patient declined MRI of his abdomen and or his thigh.  Will continue to monitor.  With temperature dropping to 95.5 will start empiric antibiotics.  Procedures: 05/11/22 Paracentesis              Assessment and Plan: * Alcoholic hepatitis with ascites Continue prednisone and likely will need a long taper.  White blood cell count likely elevated with  steroids but could also be infection since it rose today.  Will start empiric Rocephin.  Acute pain of left thigh Patient declined MRI of the left thigh.  Will continue to  monitor.  Will add on uric acid and CK  Leukocytosis Worsening leukocytosis.  With temperature at 95 will start empiric antibiotics.  Patient declined MRI of the abdomen or MRI of the leg.  Acute hepatic encephalopathy (HCC) Continue lactulose for ammonia level of 70.  Spoke with patient about the goal bowel movement is 3/day.  Alcoholic cirrhosis ([place]) Advised patient that he must stop drinking in order to be a candidate for a liver transplant.  Elevated bilirubin Bilirubin still very high at 24.6.  This may improve over time.  Alcohol dependence with withdrawal (HCC) No current signs of any withdrawal.  Hypokalemia Titrated spironolactone up to 100 mg daily so hopefully will not need potassium supplementation.  Hypocalcemia Corrected calcium normal range.  Hyponatremia Likely secondary to alcohol and cirrhosis  Chronic diastolic CHF (congestive heart failure) ([place]) Likely secondary to liver cirrhosis and anasarca.  Obesity (BMI 30-39.9) BMI 35.79        Subjective: Patient complaining of nausea and vomiting.  Not feeling well today.  Also having pain in his left lateral leg and having pain when moving his leg.  Physical Exam: Vitals:   05/21/22 2010 05/22/22 0608 05/22/22 0815 05/22/22 0842  BP: 107/60 (!) 128/56 121/63 125/68  Pulse: 85 86 97 93  Resp: 18 18 18 20   Temp: 98.3 F (36.8 C) 98.6 F (37 C) 98.2 F (36.8 C) (!) 95.5 F (35.3 C)  TempSrc: Oral Oral Oral   SpO2: 96% 99% 100% 99%  Weight:      Height:       Physical Exam HENT:     Head: Normocephalic.     Mouth/Throat:     Pharynx: No oropharyngeal exudate.  Eyes:     General: Lids are normal.     Comments: Conjunctiva icteric  Cardiovascular:     Rate and Rhythm: Normal rate and regular rhythm.     Heart sounds: Normal heart sounds, S1 normal and S2 normal.  Pulmonary:     Breath sounds: Examination of the right-lower field reveals decreased breath sounds. Examination of the left-lower  field reveals decreased breath sounds. Decreased breath sounds present. No wheezing, rhonchi or rales.  Abdominal:     General: There is distension.     Palpations: Abdomen is soft.     Tenderness: There is no abdominal tenderness.  Musculoskeletal:     Right lower leg: Swelling present.     Left lower leg: Swelling present.     Comments: Patient complains of pain in his lateral left thigh.  Able to flex at the hip.  When flexing at the knee it hurts in his thigh.  Patient resisting me moving his knee around.  Pain not in his knee when I was moving it.  Skin:    General: Skin is warm.     Coloration: Skin is jaundiced.  Neurological:     Mental Status: He is alert and oriented to person, place, and time.     Data Reviewed: Sodium 127, bilirubin 24.6, creatinine 0.64, AST 136, ALT 45, white blood cell count 29.1, hemoglobin 10.3, platelet count 250 Chest x-ray negative for pneumonia.  Family Communication: Refused  Disposition: Status is: Inpatient Remains inpatient appropriate because: Patient with new left leg pain laterally.  Patient with elevated white blood cell count.  Monitor  temperature curve.  Planned Discharge Destination: Home    Time spent: 28 minutes  Author: Loletha Grayer, MD 05/22/2022 2:23 PM  For on call review [email or web address].

## 2022-05-23 ENCOUNTER — Inpatient Hospital Stay: Payer: Medicaid Other

## 2022-05-23 LAB — CBC
HCT: 24.8 % — ABNORMAL LOW (ref 39.0–52.0)
Hemoglobin: 8.8 g/dL — ABNORMAL LOW (ref 13.0–17.0)
MCH: 38.3 pg — ABNORMAL HIGH (ref 26.0–34.0)
MCHC: 35.5 g/dL (ref 30.0–36.0)
MCV: 107.8 fL — ABNORMAL HIGH (ref 80.0–100.0)
Platelets: 210 10*3/uL (ref 150–400)
RBC: 2.3 MIL/uL — ABNORMAL LOW (ref 4.22–5.81)
RDW: 17.6 % — ABNORMAL HIGH (ref 11.5–15.5)
WBC: 24.8 10*3/uL — ABNORMAL HIGH (ref 4.0–10.5)
nRBC: 0 % (ref 0.0–0.2)

## 2022-05-23 LAB — COMPREHENSIVE METABOLIC PANEL
ALT: 47 U/L — ABNORMAL HIGH (ref 0–44)
AST: 125 U/L — ABNORMAL HIGH (ref 15–41)
Albumin: 2 g/dL — ABNORMAL LOW (ref 3.5–5.0)
Alkaline Phosphatase: 118 U/L (ref 38–126)
Anion gap: 14 (ref 5–15)
BUN: 28 mg/dL — ABNORMAL HIGH (ref 6–20)
CO2: 29 mmol/L (ref 22–32)
Calcium: 8.1 mg/dL — ABNORMAL LOW (ref 8.9–10.3)
Chloride: 85 mmol/L — ABNORMAL LOW (ref 98–111)
Creatinine, Ser: 0.64 mg/dL (ref 0.61–1.24)
GFR, Estimated: >60 mL/min (ref >60)
Glucose, Bld: 125 mg/dL — ABNORMAL HIGH (ref 70–99)
Potassium: 3.5 mmol/L (ref 3.5–5.1)
Sodium: 128 mmol/L — ABNORMAL LOW (ref 135–145)
Total Bilirubin: 21 mg/dL (ref 0.3–1.2)
Total Protein: 7.1 g/dL (ref 6.5–8.1)

## 2022-05-23 MED ORDER — PREDNISONE 20 MG PO TABS
30.0000 mg | ORAL_TABLET | Freq: Every day | ORAL | Status: DC
  Administered 2022-05-24 – 2022-06-02 (×10): 30 mg via ORAL
  Filled 2022-05-23 (×10): qty 1

## 2022-05-23 NOTE — Progress Notes (Signed)
Patient in room [place]: T Bili down to 21 from 24.6 but still double arrows up. Sharion Settler NP notified

## 2022-05-23 NOTE — Progress Notes (Signed)
Progress Note   Patient: Cameron Santiago [id number] DOB: 18-Aug-1995 DOA: 05/11/2022     12 DOS: the patient was seen and examined on 05/23/2022   Brief hospital course: This 27 y.o. male with PMH significant for EtOH liver cirrhosis, ongoing EtOH use, Essential hypertension, presented in the ED with abdominal distention associated with abdominal pain for last few days.  Patient drinks  ETOH regularly, 1 pint of hard liquor every day.  Patient denies any nausea,  vomiting. Patient also reports subjective fever at night with chills, He denies any GI bleeding, dizziness, weakness, urinary symptoms, denies any recent travel or  URI symptoms.  03/18: ED workup: Sodium 131, potassium 2.5, anion gap 13, magnesium 1.8, alkaline phosphatase 107, albumin 2.1, lipase 75, AST 169, ALT 21, ALT 9.4, total bilirubin 28.4, total protein 9.4, WBC 14.4, hemoglobin 10.9, hematocrit 31.2, MCV 104.7, platelet 192, influenza negative, COVID-negative, RSV negative, UA: Ketones negative, LE negative, nitrate negative. CTA/P:  Hepatosplenomegaly, Hepatic cirrhosis, Distended gallbladder, Perigastric and perisplenic varices. Small ascites. Admitted for treatment and monitoring EtOH withdrawal  03/19:  GI saw patient. NPO 03/20: Advance to CLD. Pt is concerned about EtOH w/drawal, remains on CIWA protocol  03/21: liver doppler (+)cirrhosis and portal HTN, advancing to full liquids.  03/22: advancing diet. (+)cough and rales, CXR showing cardiomegaly and vascular congestion --> lasix + albumin, Echo to eval further.  03/23: no concerns on Echocardiogram w/ EF 60-65% no RWMA and no diastolic df, diuresed well yesterday will repeat Lasix today. Dr Allen Norris w/ GI checked up on previous paracentesis labs and no SBP, ok to start steroids, he will be placing these orders. Given deterioration on labs / increased MELD score, Dr Allen Norris also spoke w/ [facility] transplant team - given previous pancreatitis treatment, patient is not a transplant  candidate.  03/24: hyponatremia worse to 123, reduced spironolactone. GI following. D/c abx. Bili slightly improved on steroids. GI and hospitalist teams have discussed that he is not a transplant candidate - see GI note from today.  03/25: sodium improved to 126. Bili slightly improved as well to 25. Continuing steroids. Per GI, "advanced liver cirrhosis likely from alcohol abuse. His other labs did not show any cause for his cirrhosis. The patient should continue with supportive care. Nothing further to do from a GI point of view at this time." 03/26: bilirubin down to 24, sodium 128, WBC increased to 18 w/ neutrophils, likely d/t steroids however pt reports subjective fever/chills, no cough, abdominal pain is stable. Getting CXR given some concern for infiltrate on previous but no obvious pneumonia, low threshold for restarting ceftriaxone, he declined paracentesis but may need to revisit this. Will leave on steroids for now, low suspicion for infection. Increased lasix. 3/27.  Patient hesitant about going home.  I will uptitrate spironolactone so hopefully we can get off potassium supplementation.  Declined another paracentesis. 3/28.  Patient does not feel well today.  Did not sleep well.  Declined paracentesis. 3/29.  Patient feels worse today with nausea vomiting, abdominal pain.  Patient also has left leg pain left lateral thigh.  Patient declined paracentesis.  Patient declined MRI of his abdomen and or his thigh.  Will continue to monitor.  With temperature dropping to 95.5 will start empiric antibiotics. 3/30.  White blood cell count down slightly to 24.8.  Patient still having pain in the left leg.  Ultrasound left lower extremity negative for DVT.  Wondering if pain is secondary to high-dose steroids.  Will start to taper prednisone  for tomorrow down to 30 mg daily.  CK normal range.  Patient declined MRI abdomen or MRI left leg.  Procedures: 05/11/22 Paracentesis               Assessment and Plan: * Alcoholic hepatitis with ascites Continue prednisone and likely will need a long taper.  White blood cell count likely elevated with steroids but could also be infection since it rose today.  Will start empiric Rocephin.  Acute pain of left thigh Patient declined MRI of the left thigh.  Will continue to monitor.  Will add on uric acid and CK  Leukocytosis Worsening leukocytosis.  With temperature at 95 will start empiric antibiotics.  Patient declined MRI of the abdomen or MRI of the leg.  Acute hepatic encephalopathy (HCC) Continue lactulose for ammonia level of 70.  Spoke with patient about the goal bowel movement is 3/day.  Alcoholic cirrhosis ([place]) Advised patient that he must stop drinking in order to be a candidate for a liver transplant.  Elevated bilirubin Bilirubin still very high at 24.6.  This may improve over time.  Alcohol dependence with withdrawal (HCC) No current signs of any withdrawal.  Hypokalemia Titrated spironolactone up to 100 mg daily so hopefully will not need potassium supplementation.  Hypocalcemia Corrected calcium normal range.  Hyponatremia Likely secondary to alcohol and cirrhosis  Chronic diastolic CHF (congestive heart failure) ([place]) Likely secondary to liver cirrhosis and anasarca.  Obesity (BMI 30-39.9) BMI 35.79        Subjective: Patient felt like his left leg was a little bit better but then got worse again and now feeling it on the top of his leg and not just the left side.  Unable to eat yesterday.  Had nausea vomiting.  Patient concerned that his sodium and calcium are low.  I told him that his calcium corrects into the normal range.  Sodium is low secondary to liver.  Physical Exam: Vitals:   05/22/22 0842 05/22/22 1615 05/22/22 2000 05/23/22 0812  BP: 125/68 134/71 139/75 121/65  Pulse: 93 88 85 88  Resp: 20 16 20 16   Temp: (!) 95.5 F (35.3 C) 98.4 F (36.9 C) 98.3 F (36.8  C) 97.8 F (36.6 C)  TempSrc:   Oral   SpO2: 99% 98% 98% 97%  Weight:      Height:       Physical Exam HENT:     Head: Normocephalic.     Mouth/Throat:     Pharynx: No oropharyngeal exudate.  Eyes:     General: Lids are normal.     Comments: Conjunctiva icteric  Cardiovascular:     Rate and Rhythm: Normal rate and regular rhythm.     Heart sounds: Normal heart sounds, S1 normal and S2 normal.  Pulmonary:     Breath sounds: Examination of the right-lower field reveals decreased breath sounds. Examination of the left-lower field reveals decreased breath sounds. Decreased breath sounds present. No wheezing, rhonchi or rales.  Abdominal:     General: There is distension.     Palpations: Abdomen is soft.     Tenderness: There is no abdominal tenderness.  Musculoskeletal:     Right lower leg: Swelling present.     Left lower leg: Swelling present.     Comments: Patient complains of pain in his lateral left thigh and top of left thigh.  No masses felt.  Skin:    General: Skin is warm.     Coloration: Skin is jaundiced.  Neurological:  Mental Status: He is alert and oriented to person, place, and time.     Data Reviewed: Sodium 128, creatinine 0.64, AST 125 ALT 47, total bilirubin 21, white blood cell count 24.8, hemoglobin 8.8, platelet count 210  Family Communication: Declined  Disposition: Status is: Inpatient Remains inpatient appropriate because: See how patient eats today.  Left lower extremity ultrasound negative for DVT.  Declined MRI of the abdomen or MRI of the leg at this time.  Planned Discharge Destination: Home    Time spent: 27 minutes  Author: Loletha Grayer, MD 05/23/2022 1:39 PM  For on call review [email or web address].

## 2022-05-23 NOTE — Plan of Care (Signed)
  Problem: Clinical Measurements: Goal: Diagnostic test results will improve Outcome: Progressing Goal: Respiratory complications will improve Outcome: Progressing   Problem: Activity: Goal: Risk for activity intolerance will decrease Outcome: Progressing   

## 2022-05-24 ENCOUNTER — Inpatient Hospital Stay: Payer: Medicaid Other

## 2022-05-24 LAB — COMPREHENSIVE METABOLIC PANEL
ALT: 52 U/L — ABNORMAL HIGH (ref 0–44)
AST: 136 U/L — ABNORMAL HIGH (ref 15–41)
Albumin: 2.1 g/dL — ABNORMAL LOW (ref 3.5–5.0)
Alkaline Phosphatase: 136 U/L — ABNORMAL HIGH (ref 38–126)
Anion gap: 10 (ref 5–15)
BUN: 28 mg/dL — ABNORMAL HIGH (ref 6–20)
CO2: 28 mmol/L (ref 22–32)
Calcium: 7.9 mg/dL — ABNORMAL LOW (ref 8.9–10.3)
Chloride: 88 mmol/L — ABNORMAL LOW (ref 98–111)
Creatinine, Ser: 0.56 mg/dL — ABNORMAL LOW (ref 0.61–1.24)
GFR, Estimated: >60 mL/min (ref >60)
Glucose, Bld: 118 mg/dL — ABNORMAL HIGH (ref 70–99)
Potassium: 3.6 mmol/L (ref 3.5–5.1)
Sodium: 126 mmol/L — ABNORMAL LOW (ref 135–145)
Total Bilirubin: 21.5 mg/dL (ref 0.3–1.2)
Total Protein: 7.3 g/dL (ref 6.5–8.1)

## 2022-05-24 LAB — CBC
HCT: 24.4 % — ABNORMAL LOW (ref 39.0–52.0)
Hemoglobin: 8.8 g/dL — ABNORMAL LOW (ref 13.0–17.0)
MCH: 38.8 pg — ABNORMAL HIGH (ref 26.0–34.0)
MCHC: 36.1 g/dL — ABNORMAL HIGH (ref 30.0–36.0)
MCV: 107.5 fL — ABNORMAL HIGH (ref 80.0–100.0)
Platelets: 203 10*3/uL (ref 150–400)
RBC: 2.27 MIL/uL — ABNORMAL LOW (ref 4.22–5.81)
RDW: 17.2 % — ABNORMAL HIGH (ref 11.5–15.5)
WBC: 26.6 10*3/uL — ABNORMAL HIGH (ref 4.0–10.5)
nRBC: 0.1 % (ref 0.0–0.2)

## 2022-05-24 LAB — MAGNESIUM: Magnesium: 1.9 mg/dL (ref 1.7–2.4)

## 2022-05-24 LAB — PHOSPHORUS: Phosphorus: 3.7 mg/dL (ref 2.5–4.6)

## 2022-05-24 MED ORDER — DICLOFENAC SODIUM 1 % EX GEL
2.0000 g | Freq: Four times a day (QID) | CUTANEOUS | Status: DC
  Administered 2022-05-24 – 2022-06-02 (×24): 2 g via TOPICAL
  Filled 2022-05-24: qty 100

## 2022-05-24 MED ORDER — GADOBUTROL 1 MMOL/ML IV SOLN
10.0000 mL | Freq: Once | INTRAVENOUS | Status: AC | PRN
  Administered 2022-05-24: 10 mL via INTRAVENOUS

## 2022-05-24 NOTE — Progress Notes (Signed)
Progress Note   Patient: Cameron Santiago [id number] DOB: 1995/04/06 DOA: 05/11/2022     13 DOS: the patient was seen and examined on 05/24/2022   Brief hospital course: This 27 y.o. male with PMH significant for EtOH liver cirrhosis, ongoing EtOH use, Essential hypertension, presented in the ED with abdominal distention associated with abdominal pain for last few days.  Patient drinks  ETOH regularly, 1 pint of hard liquor every day.  Patient denies any nausea,  vomiting. Patient also reports subjective fever at night with chills, He denies any GI bleeding, dizziness, weakness, urinary symptoms, denies any recent travel or  URI symptoms.  03/18: ED workup: Sodium 131, potassium 2.5, anion gap 13, magnesium 1.8, alkaline phosphatase 107, albumin 2.1, lipase 75, AST 169, ALT 21, ALT 9.4, total bilirubin 28.4, total protein 9.4, WBC 14.4, hemoglobin 10.9, hematocrit 31.2, MCV 104.7, platelet 192, influenza negative, COVID-negative, RSV negative, UA: Ketones negative, LE negative, nitrate negative. CTA/P:  Hepatosplenomegaly, Hepatic cirrhosis, Distended gallbladder, Perigastric and perisplenic varices. Small ascites. Admitted for treatment and monitoring EtOH withdrawal  03/19:  GI saw patient. NPO 03/20: Advance to [facility]. Pt is concerned about EtOH w/drawal, remains on CIWA protocol  03/21: liver doppler (+)cirrhosis and portal HTN, advancing to full liquids.  03/22: advancing diet. (+)cough and rales, CXR showing cardiomegaly and vascular congestion --> lasix + albumin, Echo to eval further.  03/23: no concerns on Echocardiogram w/ EF 60-65% no RWMA and no diastolic df, diuresed well yesterday will repeat Lasix today. Dr Allen Norris w/ GI checked up on previous paracentesis labs and no SBP, ok to start steroids, he will be placing these orders. Given deterioration on labs / increased MELD score, Dr Allen Norris also spoke w/ [facility] transplant team - given previous pancreatitis treatment, patient is not a transplant  candidate.  03/24: hyponatremia worse to 123, reduced spironolactone. GI following. D/c abx. Bili slightly improved on steroids. GI and hospitalist teams have discussed that he is not a transplant candidate - see GI note from today.  03/25: sodium improved to 126. Bili slightly improved as well to 25. Continuing steroids. Per GI, "advanced liver cirrhosis likely from alcohol abuse. His other labs did not show any cause for his cirrhosis. The patient should continue with supportive care. Nothing further to do from a GI point of view at this time." 03/26: bilirubin down to 24, sodium 128, WBC increased to 18 w/ neutrophils, likely d/t steroids however pt reports subjective fever/chills, no cough, abdominal pain is stable. Getting CXR given some concern for infiltrate on previous but no obvious pneumonia, low threshold for restarting ceftriaxone, he declined paracentesis but may need to revisit this. Will leave on steroids for now, low suspicion for infection. Increased lasix. 3/27.  Patient hesitant about going home.  I will uptitrate spironolactone so hopefully we can get off potassium supplementation.  Declined another paracentesis. 3/28.  Patient does not feel well today.  Did not sleep well.  Declined paracentesis. 3/29.  Patient feels worse today with nausea vomiting, abdominal pain.  Patient also has left leg pain left lateral thigh.  Patient declined paracentesis.  Patient declined MRI of his abdomen and or his thigh.  Will continue to monitor.  With temperature dropping to 95.5 will start empiric antibiotics. 3/30.  White blood cell count down slightly to 24.8.  Patient still having pain in the left leg.  Ultrasound left lower extremity negative for DVT.  Wondering if pain is secondary to high-dose steroids.  Will start to taper prednisone  for tomorrow down to 30 mg daily.  CK normal range.  Patient declined MRI abdomen or MRI left leg. 3/31.  Patient still not feeling well.  Asking about prognosis.   Left leg pain persists.  Agreeable for MRI left leg.  Procedures: 05/11/22 Paracentesis              Assessment and Plan: * Alcoholic hepatitis with ascites Continue prednisone but with his leg pain and will taper down to 30 mg daily.  White blood cell count likely elevated with steroids but could also be infection.  Started empiric Rocephin on 3/29 since the patient had a low temperature..  Acute pain of left thigh MRI left leg ordered.  Ultrasound negative for DVT.  CK and uric acid normal.  Start to taper prednisone just in case steroid myopathy.  Leukocytosis Worsening leukocytosis on 3/29.  Empiric Rocephin started on 3/29.  White blood cell count still elevated at 26.6.  MRI left leg.  Acute hepatic encephalopathy (HCC) Continue lactulose for ammonia level of 70.  Spoke with patient about the goal bowel movement is 3/day.  Alcoholic cirrhosis ([place]) Advised patient that he must stop drinking in order to be a candidate for a liver transplant.  Calculated Childs class Pugh score of 13 which would be class C which would be a 65% mortality within 2 years.  Palliative care consultation.  Elevated bilirubin Bilirubin still very high at 21.5.  This may improve over time.  Alcohol dependence with withdrawal (HCC) No current signs of any withdrawal.  Hypokalemia With spironolactone at 100 mg daily he will not need potassium replacement.  Hypocalcemia Corrected calcium normal range.  Hyponatremia Likely secondary to alcohol and cirrhosis  Chronic diastolic CHF (congestive heart failure) ([place]) Likely secondary to liver cirrhosis and anasarca.  Obesity (BMI 30-39.9) BMI 35.79        Subjective: Patient asking about prognosis and his classification of cirrhosis.  Patient still not feeling well.  Still having left leg pain.  Physical Exam: Vitals:   05/23/22 0812 05/23/22 1953 05/24/22 0531 05/24/22 0928  BP: 121/65 (!) 145/74 121/65 (!) 127/57  Pulse: 88 96 87  87  Resp: 16 18 16 16   Temp: 97.8 F (36.6 C) 98.9 F (37.2 C) 98.1 F (36.7 C) 98 F (36.7 C)  TempSrc:      SpO2: 97% 98% 97% 100%  Weight:      Height:       Physical Exam HENT:     Head: Normocephalic.     Mouth/Throat:     Pharynx: No oropharyngeal exudate.  Eyes:     General: Lids are normal.     Comments: Conjunctiva icteric  Cardiovascular:     Rate and Rhythm: Normal rate and regular rhythm.     Heart sounds: Normal heart sounds, S1 normal and S2 normal.  Pulmonary:     Breath sounds: Examination of the right-lower field reveals decreased breath sounds. Examination of the left-lower field reveals decreased breath sounds. Decreased breath sounds present. No wheezing, rhonchi or rales.  Abdominal:     General: There is distension.     Palpations: Abdomen is soft.     Tenderness: There is no abdominal tenderness.  Musculoskeletal:     Right lower leg: Swelling present.     Left lower leg: Swelling present.     Comments: Patient complains of pain in his lateral left thigh and top of left thigh.  No masses felt.  Skin:    General: Skin  is warm.     Coloration: Skin is jaundiced.  Neurological:     Mental Status: He is alert and oriented to person, place, and time.     Data Reviewed: Sodium 126, creatinine 0.56, alkaline phosphatase 136, AST 136, ALT 52, total bilirubin 21.5, white blood cell count 26.6, hemoglobin 8.8, platelet count 203  Family Communication: Patient again declined me calling family.  Disposition: Status is: Inpatient Remains inpatient appropriate because: Evaluating left leg pain with MRI.  Planned Discharge Destination: Home    Time spent: 26 minutes  Author: Loletha Grayer, MD 05/24/2022 10:49 AM  For on call review [email or web address].

## 2022-05-24 NOTE — Progress Notes (Signed)
Mri leg reviewed with patient.  Likely a hematoma.  Will stop lovenox injections and use teds and scd.  Fells a little better with diclofenac gel.  Will also use heating pad.  Re-eval tomorrow to see if this needs drainage or not.  Dr Leslye Peer

## 2022-05-25 ENCOUNTER — Inpatient Hospital Stay: Payer: Medicaid Other

## 2022-05-25 DIAGNOSIS — M79652 Pain in left thigh: Secondary | ICD-10-CM

## 2022-05-25 DIAGNOSIS — D72829 Elevated white blood cell count, unspecified: Secondary | ICD-10-CM

## 2022-05-25 LAB — PATHOLOGIST SMEAR REVIEW

## 2022-05-25 LAB — BODY FLUID CELL COUNT WITH DIFFERENTIAL
Eos, Fluid: 0 %
Lymphs, Fluid: 27 %
Monocyte-Macrophage-Serous Fluid: 1 % — ABNORMAL LOW (ref 50–90)
Neutrophil Count, Fluid: 72 % — ABNORMAL HIGH (ref 0–25)
Total Nucleated Cell Count, Fluid: 2817 cu mm — ABNORMAL HIGH (ref 0–1000)

## 2022-05-25 MED ORDER — MIDAZOLAM HCL 2 MG/2ML IJ SOLN
INTRAMUSCULAR | Status: AC | PRN
  Administered 2022-05-25 (×2): 1 mg via INTRAVENOUS

## 2022-05-25 MED ORDER — LIDOCAINE HCL (PF) 1 % IJ SOLN
10.0000 mL | Freq: Once | INTRAMUSCULAR | Status: AC
  Administered 2022-05-25: 10 mL via INTRADERMAL

## 2022-05-25 MED ORDER — MIDAZOLAM HCL 2 MG/2ML IJ SOLN
INTRAMUSCULAR | Status: AC
  Filled 2022-05-25: qty 4

## 2022-05-25 MED ORDER — FENTANYL CITRATE (PF) 100 MCG/2ML IJ SOLN
INTRAMUSCULAR | Status: AC | PRN
  Administered 2022-05-25 (×2): 50 ug via INTRAVENOUS

## 2022-05-25 MED ORDER — VANCOMYCIN HCL 2000 MG/400ML IV SOLN
2000.0000 mg | Freq: Once | INTRAVENOUS | Status: DC
  Filled 2022-05-25: qty 400

## 2022-05-25 MED ORDER — LIDOCAINE-EPINEPHRINE 1 %-1:100000 IJ SOLN: 10.0000 mL | Freq: Once | INTRAMUSCULAR | Status: DC

## 2022-05-25 MED ORDER — MORPHINE SULFATE (PF) 2 MG/ML IV SOLN
1.0000 mg | INTRAVENOUS | Status: DC | PRN
  Administered 2022-05-25 – 2022-05-29 (×11): 1 mg via INTRAVENOUS
  Filled 2022-05-25 (×11): qty 1

## 2022-05-25 MED ORDER — FENTANYL CITRATE (PF) 100 MCG/2ML IJ SOLN
INTRAMUSCULAR | Status: AC
  Filled 2022-05-25: qty 4

## 2022-05-25 NOTE — Progress Notes (Signed)
Progress Note   Patient: Cameron Santiago [id number] DOB: 04-28-1995 DOA: 05/11/2022     14 DOS: the patient was seen and examined on 05/25/2022   Brief hospital course: This 27 y.o. male with PMH significant for EtOH liver cirrhosis, ongoing EtOH use, Essential hypertension, presented in the ED with abdominal distention associated with abdominal pain for last few days.  Patient drinks  ETOH regularly, 1 pint of hard liquor every day.  Patient denies any nausea,  vomiting. Patient also reports subjective fever at night with chills, He denies any GI bleeding, dizziness, weakness, urinary symptoms, denies any recent travel or  URI symptoms.  03/18: ED workup: Sodium 131, potassium 2.5, anion gap 13, magnesium 1.8, alkaline phosphatase 107, albumin 2.1, lipase 75, AST 169, ALT 21, ALT 9.4, total bilirubin 28.4, total protein 9.4, WBC 14.4, hemoglobin 10.9, hematocrit 31.2, MCV 104.7, platelet 192, influenza negative, COVID-negative, RSV negative, UA: Ketones negative, LE negative, nitrate negative. CTA/P:  Hepatosplenomegaly, Hepatic cirrhosis, Distended gallbladder, Perigastric and perisplenic varices. Small ascites. Admitted for treatment and monitoring EtOH withdrawal  03/19:  GI saw patient. NPO 03/20: Advance to [facility]D. Pt is concerned about EtOH w/drawal, remains on CIWA protocol  03/21: liver doppler (+)cirrhosis and portal HTN, advancing to full liquids.  03/22: advancing diet. (+)cough and rales, CXR showing cardiomegaly and vascular congestion --> lasix + albumin, Echo to eval further.  03/23: no concerns on Echocardiogram w/ EF 60-65% no RWMA and no diastolic df, diuresed well yesterday will repeat Lasix today. Dr Allen Norris w/ GI checked up on previous paracentesis labs and no SBP, ok to start steroids, he will be placing these orders. Given deterioration on labs / increased MELD score, Dr Allen Norris also spoke w/ [facility] transplant team - given previous pancreatitis treatment, patient is not a transplant  candidate.  03/24: hyponatremia worse to 123, reduced spironolactone. GI following. D/c abx. Bili slightly improved on steroids. GI and hospitalist teams have discussed that he is not a transplant candidate - see GI note from today.  03/25: sodium improved to 126. Bili slightly improved as well to 25. Continuing steroids. Per GI, "advanced liver cirrhosis likely from alcohol abuse. His other labs did not show any cause for his cirrhosis. The patient should continue with supportive care. Nothing further to do from a GI point of view at this time." 03/26: bilirubin down to 24, sodium 128, WBC increased to 18 w/ neutrophils, likely d/t steroids however pt reports subjective fever/chills, no cough, abdominal pain is stable. Getting CXR given some concern for infiltrate on previous but no obvious pneumonia, low threshold for restarting ceftriaxone, he declined paracentesis but may need to revisit this. Will leave on steroids for now, low suspicion for infection. Increased lasix. 3/27.  Patient hesitant about going home.  I will uptitrate spironolactone so hopefully we can get off potassium supplementation.  Declined another paracentesis. 3/28.  Patient does not feel well today.  Did not sleep well.  Declined paracentesis. 3/29.  Patient feels worse today with nausea vomiting, abdominal pain.  Patient also has left leg pain left lateral thigh.  Patient declined paracentesis.  Patient declined MRI of his abdomen and or his thigh.  Will continue to monitor.  With temperature dropping to 95.5 will start empiric antibiotics. 3/30.  White blood cell count down slightly to 24.8.  Patient still having pain in the left leg.  Ultrasound left lower extremity negative for DVT.  Wondering if pain is secondary to high-dose steroids.  Will start to taper prednisone  for tomorrow down to 30 mg daily.  CK normal range.  Patient declined MRI abdomen or MRI left leg. 3/31.  Patient still not feeling well.  Asking about prognosis.   Left leg pain persists.  MRI of the leg showed a large fluid collection likely hematoma 4/1.  Spoke with interventional radiology team.  They got an ultrasound and did a fluid aspiration and they think it is hematoma.  Red blood cells 11,413.  White blood cell count 2817 with 72% lymphocytes.  On empiric Rocephin.  Await Gram stain and culture.  Procedures: 05/11/22 Paracentesis  05/25/2022.  Leg aspiration.             Assessment and Plan: * Alcoholic hepatitis with ascites Continue prednisone but with his leg pain and will taper down to 30 mg daily.  White blood cell count likely elevated with steroids but could also be infection.  Started empiric Rocephin on 3/29 since the patient had a low temperature..  Acute pain of left thigh MRI left leg showed a large fluid collection.  Ultrasound negative for DVT.  CK and uric acid normal.  Interventional radiology did a drainage.  They felt it was most likely hematoma.  Red blood cells 11,413 but white blood cells also elevated at 2817.  Lovenox discontinued on 3/31.  Leukocytosis Worsening leukocytosis on 3/29.  Empiric Rocephin started on 3/29.  White blood cell count still elevated at 26.6.  MRI left leg showed large fluid collection.   Acute hepatic encephalopathy Continue lactulose for ammonia level of 70.  Spoke with patient about the goal bowel movement is 3/day.  Alcoholic cirrhosis Advised patient that he must stop drinking in order to be a candidate for a liver transplant.  Calculated Childs class Pugh score of 13 which would be class C which would be a 65% mortality within 2 years.  Palliative care consultation.  Elevated bilirubin Bilirubin still very high at 21.5.  This may improve over time.  Alcohol dependence with withdrawal No current signs of any withdrawal.  Hypokalemia With spironolactone at 100 mg daily he will not need potassium replacement.  Hypocalcemia Corrected calcium normal  range.  Hyponatremia Likely secondary to alcohol and cirrhosis  Chronic diastolic CHF (congestive heart failure) Likely secondary to liver cirrhosis and anasarca.  Obesity (BMI 30-39.9) BMI 35.79        Subjective: Patient seen this morning and was still having some leg pain.  Was okay with having interventional radiology get a sample to see if this is hematoma or infection.  Radiologist felt it was more like a hematoma.  Physical Exam: Vitals:   05/25/22 1215 05/25/22 1220 05/25/22 1231 05/25/22 1258  BP: 133/60 (!) 142/73 (!) 140/69 139/70  Pulse: 88 89 92 92  Resp: (!) 27 14 19  (!) 22  Temp:      TempSrc:      SpO2: 97% 95% 91% 92%  Weight:      Height:       Physical Exam HENT:     Head: Normocephalic.     Mouth/Throat:     Pharynx: No oropharyngeal exudate.  Eyes:     General: Lids are normal.     Comments: Conjunctiva icteric  Cardiovascular:     Rate and Rhythm: Normal rate and regular rhythm.     Heart sounds: Normal heart sounds, S1 normal and S2 normal.  Pulmonary:     Breath sounds: Examination of the right-lower field reveals decreased breath sounds. Examination of the left-lower field  reveals decreased breath sounds. Decreased breath sounds present. No wheezing, rhonchi or rales.  Abdominal:     General: There is distension.     Palpations: Abdomen is soft.     Tenderness: There is no abdominal tenderness.  Musculoskeletal:     Left upper leg: Swelling present.     Right lower leg: Swelling present.     Left lower leg: Swelling present.     Comments: Patient complains of pain in his lateral left thigh and top of left thigh.  No masses felt.  Skin:    General: Skin is warm.     Coloration: Skin is jaundiced.  Neurological:     Mental Status: He is alert and oriented to person, place, and time.     Data Reviewed:  Fluid shows 2817 nucleated cells with 72% neutrophils.  Family Communication: Declined  Disposition: Status is:  Inpatient Remains inpatient appropriate because: Watching left leg closely status post drainage procedure.  Watching cultures.  On empiric antibiotic.  Planned Discharge Destination: Home    Time spent: 28 minutes Case discussed with radiology.  Author: Loletha Grayer, MD 05/25/2022 2:49 PM  For on call review [email or web address]

## 2022-05-25 NOTE — Consult Note (Signed)
Chief Complaint: Patient was seen in consultation today for left thigh fluid collection  Referring Physician(s): Loletha Grayer, MD  Supervising Physician: Juliet Rude  Patient Status: Cameron Santiago - In-pt  History of Present Illness: Cameron Santiago is a 27 y.o. male with PMH significant for alcohol abuse, alcoholic pancreatitis, alcoholic cirrhosis, and GERD being seen today for a newly discovered fluid collection of the left thigh. Patient has been endorsing significant left leg pain, and MRI performed 05/24/22 showed a large complex intramuscular fluid collection within the left vastus intermedius. Patient underwent left lower extremity ultrasound to further evaluate this fluid and imaging was inconclusive as to whether fluid collection represents hematoma or another process. Patient subsequently referred to IR to evaluate for possible image-guided aspiration with possible drain placement of the fluid collection.  Past Medical History:  Diagnosis Date   Alcohol abuse    Alcoholic pancreatitis    GERD (gastroesophageal reflux disease)     Past Surgical History:  Procedure Laterality Date   APPENDECTOMY     LAPAROSCOPIC APPENDECTOMY N/A 02/15/2015   Procedure: APPENDECTOMY LAPAROSCOPIC;  Surgeon: Sherri Rad, MD;  Location: [facility] ORS;  Service: General;  Laterality: N/A;   TONSILLECTOMY      Allergies: Patient has no known allergies.  Medications: Prior to Admission medications   Medication Sig Start Date End Date Taking? Authorizing Provider  albuterol (VENTOLIN HFA) 108 (90 Base) MCG/ACT inhaler Inhale 2 puffs into the lungs every 6 (six) hours as needed for wheezing or shortness of breath. Patient not taking: Reported on 05/11/2022 11/22/19   Marlana Salvage, PA  amLODipine (NORVASC) 5 MG tablet Take 1 tablet (5 mg total) by mouth daily. 07/17/21 08/16/21  Antonieta Pert, MD  folic acid (FOLVITE) 1 MG tablet Take 1 tablet (1 mg total) by mouth daily. Patient not taking:  Reported on 05/11/2022 01/01/22   Wouk, Ailene Rud, MD  furosemide (LASIX) 40 MG tablet Take 1 tablet (40 mg total) by mouth daily. Patient not taking: Reported on 05/11/2022 01/01/22   Wouk, Ailene Rud, MD  Multiple Vitamin (MULTIVITAMIN WITH MINERALS) TABS tablet Take 1 tablet by mouth daily. Patient not taking: Reported on 05/11/2022 01/01/22   Wouk, Ailene Rud, MD  spironolactone (ALDACTONE) 100 MG tablet Take 1 tablet (100 mg total) by mouth daily. Patient not taking: Reported on 05/11/2022 01/01/22   Wouk, Ailene Rud, MD     Family History  Problem Relation Age of Onset   Arthritis Mother     Social History   Socioeconomic History   Marital status: Single    Spouse name: Not on file   Number of children: Not on file   Years of education: Not on file   Highest education level: Not on file  Occupational History   Not on file  Tobacco Use   Smoking status: Never   Smokeless tobacco: Never  Substance and Sexual Activity   Alcohol use: Yes    Comment: occassional   Drug use: Never   Sexual activity: Yes  Other Topics Concern   Not on file  Social History Narrative   Not on file   Social Determinants of Health   Financial Resource Strain: Not on file  Food Insecurity: No Food Insecurity (05/11/2022)   Hunger Vital Sign    Worried About Running Out of Food in the Last Year: Never true    Ran Out of Food in the Last Year: Never true  Transportation Needs: Unmet Transportation Needs (05/11/2022)   [place] -  Hydrologist (Medical): Yes    Lack of Transportation (Non-Medical): Yes  Physical Activity: Not on file  Stress: Not on file  Social Connections: Not on file    Code Status: Full Code  Review of Systems: A 12 point ROS discussed and pertinent positives are indicated in the HPI above.  All other systems are negative.  Review of Systems  Constitutional:  Negative for chills and fever.  Respiratory:  Negative for chest tightness and  shortness of breath.   Cardiovascular:  Positive for leg swelling. Negative for chest pain.  Gastrointestinal:  Negative for abdominal pain, diarrhea, nausea and vomiting.  Neurological:  Negative for dizziness and headaches.  Psychiatric/Behavioral:  Negative for confusion.     Vital Signs: BP 131/69 (BP Location: Left Arm)   Pulse 91   Temp 98.5 F (36.9 C)   Resp 18   Ht 5\' 11"  (1.803 m)   Wt 256 lb 9.9 oz (116.4 kg)   SpO2 98%   BMI 35.79 kg/m     Physical Exam Vitals reviewed.  Constitutional:      General: He is in acute distress.     Appearance: He is ill-appearing.     Comments: Patient in significant pain at time of exam  Cardiovascular:     Rate and Rhythm: Regular rhythm. Tachycardia present.     Pulses: Normal pulses.     Heart sounds: Normal heart sounds.  Pulmonary:     Effort: Pulmonary effort is normal.     Breath sounds: Examination of the right-lower field reveals decreased breath sounds. Examination of the left-lower field reveals decreased breath sounds. Decreased breath sounds present.  Abdominal:     General: Bowel sounds are normal.     Palpations: Abdomen is soft.     Tenderness: There is no abdominal tenderness.  Musculoskeletal:     Right lower leg: Edema present.     Left lower leg: Edema present.  Skin:    General: Skin is warm and dry.     Coloration: Skin is jaundiced.  Neurological:     Mental Status: He is alert and oriented to person, place, and time.  Psychiatric:        Mood and Affect: Mood normal.        Behavior: Behavior normal.        Thought Content: Thought content normal.        Judgment: Judgment normal.     Imaging: MR FEMUR LEFT W WO CONTRAST  Result Date: 05/24/2022 CLINICAL DATA:  Leg pain and swelling. Concern for steroid myopathy. Leukocytosis. EXAM: MR OF THE LEFT LOWER EXTREMITY WITHOUT AND WITH CONTRAST TECHNIQUE: Multiplanar, multisequence MR imaging of the left thigh was performed both before and after  administration of intravenous contrast. CONTRAST:  57mL GADAVIST GADOBUTROL 1 MMOL/ML IV SOLN COMPARISON:  Left lower extremity venous Doppler ultrasound 05/23/2022. FINDINGS: Bones/Joint/Cartilage Coronal images include both thighs. Both femurs appear normal. There is no evidence of acute fracture, dislocation suspicious osseous lesion. No significant hip arthropathy or effusion. There is a small nonspecific left knee joint effusion. No suspicious synovial or marrow enhancement. Ligaments No significant ligamentous abnormalities are identified. Muscles and Tendons There is a large complex intramuscular fluid collection lateral to the left mid femur, within the vastus intermedius muscle. This collection measures approximately 13.7 x 5.6 x 5.1 cm and demonstrates multiple internal fluid-fluid levels. There are dependent components with mild intrinsic T1 shortening, suggesting hematocrit affect related to a hematoma.  Post-contrast, there is no significant central or peripheral enhancement of this collection. There is diffuse edema throughout the quadriceps musculature, also greatest within the vastus medialis. There is ill-defined fluid along the fascial planes, extending into the adductor compartment in the proximal aspect of the left thigh. There is no suspicious enhancement associated with these areas. The left hamstring muscles appear normal. No significant muscular abnormalities are seen in the right thigh. The left hip and knee tendons appear intact. Soft tissues Subcutaneous edema in both thighs, greater on the left. Apart from the complex fluid collection in the left vastus intermedius muscle, no other focal fluid collections are seen. No acute vascular findings are evident. The left thigh edema is not perivascular in distribution. IMPRESSION: 1. Large complex intramuscular fluid collection within the left vastus intermedius muscle with multiple internal fluid-fluid levels, most consistent with a hematoma.  There is no significant central or peripheral enhancement to suggest an abscess. This could be aspirated if there is clinical concern for infection. 2. Associated diffuse edema throughout the quadriceps musculature and ill-defined fluid along the fascial planes in the proximal aspect of the left thigh, nonspecific. Findings may be reactive to the hematoma, posttraumatic or inflammatory. No significant right thigh muscular abnormalities are identified. 3. No acute osseous findings. Small nonspecific left knee joint effusion. Electronically Signed   By: Richardean Sale M.D.   On: 05/24/2022 13:34   US Venous Img Lower Unilateral Left (DVT)  Result Date: 05/23/2022 CLINICAL DATA:  Left lower extremity pain and edema. EXAM: LEFT LOWER EXTREMITY VENOUS DOPPLER ULTRASOUND TECHNIQUE: Gray-scale sonography with graded compression, as well as color Doppler and duplex ultrasound were performed to evaluate the lower extremity deep venous systems from the level of the common femoral vein and including the common femoral, femoral, profunda femoral, popliteal and calf veins including the posterior tibial, peroneal and gastrocnemius veins when visible. The superficial great saphenous vein was also interrogated. Spectral Doppler was utilized to evaluate flow at rest and with distal augmentation maneuvers in the common femoral, femoral and popliteal veins. COMPARISON:  None Available. FINDINGS: Contralateral Common Femoral Vein: Respiratory phasicity is normal and symmetric with the symptomatic side. No evidence of thrombus. Normal compressibility. Common Femoral Vein: No evidence of thrombus. Normal compressibility, respiratory phasicity and response to augmentation. Saphenofemoral Junction: No evidence of thrombus. Normal compressibility and flow on color Doppler imaging. Profunda Femoral Vein: No evidence of thrombus. Normal compressibility and flow on color Doppler imaging. Femoral Vein: No evidence of thrombus. Normal  compressibility, respiratory phasicity and response to augmentation. Popliteal Vein: No evidence of thrombus. Normal compressibility, respiratory phasicity and response to augmentation. Calf Veins: No evidence of thrombus. Normal compressibility and flow on color Doppler imaging. Superficial Great Saphenous Vein: No evidence of thrombus. Normal compressibility. Venous Reflux:  None. Other Findings: No evidence of superficial thrombophlebitis or abnormal fluid collection. IMPRESSION: No evidence of left lower extremity deep venous thrombosis. Electronically Signed   By: Aletta Edouard M.D.   On: 05/23/2022 10:33   DG Chest 2 View  Result Date: 05/22/2022 CLINICAL DATA:  Leukocytosis. EXAM: CHEST - 2 VIEW COMPARISON:  05/19/2022. FINDINGS: 0743 hours. Low lung volumes accentuate the pulmonary vasculature and cardiomediastinal silhouette. Scattered linear opacities in the left-greater-than-right lungs, likely reflect subsegmental atelectasis. No consolidation. No pleural effusion or pneumothorax. IMPRESSION: Low lung volumes with scattered subsegmental atelectasis. No consolidation. Electronically Signed   By: Emmit Alexanders M.D.   On: 05/22/2022 08:37   DG Chest Port 1 View  Result Date:  05/19/2022 CLINICAL DATA:  Prior abnormal chest x-ray, history of cough EXAM: PORTABLE CHEST 1 VIEW COMPARISON:  05/15/2022, 11/21/2019 FINDINGS: Single frontal view of the chest demonstrates persistent enlargement of the cardiac silhouette. There is chronic vascular congestion, with persistent left perihilar airspace disease. No effusion or pneumothorax. No acute bony abnormalities. IMPRESSION: 1. Constellation of findings most suggestive of congestive heart failure and mild asymmetric edema. Underlying left-sided pneumonia would be difficult to exclude. Continued follow-up after appropriate medical management is recommended, with PA and lateral chest x-ray recommended. Electronically Signed   By: Randa Ngo M.D.   On:  05/19/2022 13:33   US Abdomen Limited  Result Date: 05/18/2022 CLINICAL DATA:  [id number] Hepatic failure ([place]) [id number] EXAM: LIMITED ABDOMEN ULTRASOUND FOR ASCITES TECHNIQUE: Limited ultrasound survey for ascites was performed in all four abdominal quadrants. COMPARISON:  None Available. FINDINGS: Limited sonographic exam of the abdomen was performed for fluid assessment. Images demonstrate small amount of perihepatic ascites. The patient declined paracentesis. IMPRESSION: Small volume perihepatic ascites. The patient declined paracentesis. Electronically Signed   By: Albin Felling M.D.   On: 05/18/2022 12:12   ECHOCARDIOGRAM COMPLETE  Result Date: 05/15/2022    ECHOCARDIOGRAM REPORT   Patient Name:   Cameron Santiago Date of Exam: 05/15/2022 Medical Rec #:  [id number]          Height:       71.0 in Accession #:    [id number]         Weight:       248.2 lb Date of Birth:  08/18/1995           BSA:          2.311 m Patient Age:    26 years           BP:           124/66 mmHg Patient Gender: M                  HR:           86 bpm. Exam Location:  [facility] Procedure: 2D Echo, Cardiac Doppler and Color Doppler Indications:     Cardiomegaly I51.7  History:         Patient has no prior history of Echocardiogram examinations.                  Alcohol abuse, GERD.  Sonographer:     Sherrie Sport Referring Phys:  [id number] Emeterio Reeve Diagnosing Phys: Ida Rogue MD IMPRESSIONS  1. Left ventricular ejection fraction, by estimation, is 60 to 65%. The left ventricle has normal function. The left ventricle has no regional wall motion abnormalities. Left ventricular diastolic parameters were normal.  2. Right ventricular systolic function is normal. The right ventricular size is normal.  3. The mitral valve is normal in structure. Mild mitral valve regurgitation. No evidence of mitral stenosis.  4. The aortic valve is normal in structure. Aortic valve regurgitation is not visualized. No aortic stenosis is present.  5. The  inferior vena cava is normal in size with greater than 50% respiratory variability, suggesting right atrial pressure of 3 mmHg. FINDINGS  Left Ventricle: Left ventricular ejection fraction, by estimation, is 60 to 65%. The left ventricle has normal function. The left ventricle has no regional wall motion abnormalities. The left ventricular internal cavity size was normal in size. There is  no left ventricular hypertrophy. Left ventricular diastolic parameters were normal. Right Ventricle: The right ventricular size is normal.  No increase in right ventricular wall thickness. Right ventricular systolic function is normal. Left Atrium: Left atrial size was normal in size. Right Atrium: Right atrial size was normal in size. Pericardium: There is no evidence of pericardial effusion. Mitral Valve: The mitral valve is normal in structure. Mild mitral valve regurgitation. No evidence of mitral valve stenosis. MV peak gradient, 8.8 mmHg. The mean mitral valve gradient is 3.0 mmHg. Tricuspid Valve: The tricuspid valve is normal in structure. Tricuspid valve regurgitation is mild . No evidence of tricuspid stenosis. Aortic Valve: The aortic valve is normal in structure. Aortic valve regurgitation is not visualized. No aortic stenosis is present. Aortic valve mean gradient measures 9.0 mmHg. Aortic valve peak gradient measures 13.7 mmHg. Aortic valve area, by VTI measures 2.54 cm. Pulmonic Valve: The pulmonic valve was normal in structure. Pulmonic valve regurgitation is not visualized. No evidence of pulmonic stenosis. Aorta: The aortic root is normal in size and structure. Venous: The inferior vena cava is normal in size with greater than 50% respiratory variability, suggesting right atrial pressure of 3 mmHg. IAS/Shunts: No atrial level shunt detected by color flow Doppler.  LEFT VENTRICLE PLAX 2D LVIDd:         5.70 cm   Diastology LVIDs:         3.50 cm   LV e' medial:    13.20 cm/s LV PW:         1.20 cm   LV E/e'  medial:  9.8 LV IVS:        0.90 cm   LV e' lateral:   19.60 cm/s LVOT diam:     2.00 cm   LV E/e' lateral: 6.6 LV SV:         91 LV SV Index:   39 LVOT Area:     3.14 cm  RIGHT VENTRICLE RV Basal diam:  4.40 cm RV Mid diam:    4.50 cm LEFT ATRIUM             Index        RIGHT ATRIUM           Index LA diam:        3.90 cm 1.69 cm/m   RA Area:     12.80 cm LA Vol (A2C):   35.0 ml 15.14 ml/m  RA Volume:   28.90 ml  12.50 ml/m LA Vol (A4C):   51.6 ml 22.32 ml/m LA Biplane Vol: 47.1 ml 20.38 ml/m  AORTIC VALVE AV Area (Vmax):    2.39 cm AV Area (Vmean):   2.36 cm AV Area (VTI):     2.54 cm AV Vmax:           185.00 cm/s AV Vmean:          137.000 cm/s AV VTI:            0.359 m AV Peak Grad:      13.7 mmHg AV Mean Grad:      9.0 mmHg LVOT Vmax:         141.00 cm/s LVOT Vmean:        103.000 cm/s LVOT VTI:          0.290 m LVOT/AV VTI ratio: 0.81  AORTA Ao Root diam: 3.10 cm MITRAL VALVE                TRICUSPID VALVE MV Area (PHT): 4.08 cm     TR Peak grad:   34.6 mmHg MV Area VTI:  3.41 cm     TR Vmax:        294.00 cm/s MV Peak grad:  8.8 mmHg MV Mean grad:  3.0 mmHg     SHUNTS MV Vmax:       1.48 m/s     Systemic VTI:  0.29 m MV Vmean:      83.1 cm/s    Systemic Diam: 2.00 cm MV Decel Time: 186 msec MV E velocity: 130.00 cm/s MV A velocity: 66.60 cm/s MV E/A ratio:  1.95 Ida Rogue MD Electronically signed by Ida Rogue MD Signature Date/Time: 05/15/2022/3:27:32 PM    Final    DG Chest Port 1 View  Result Date: 05/15/2022 CLINICAL DATA:  Cough EXAM: PORTABLE CHEST 1 VIEW COMPARISON:  11/21/2019 x-ray FINDINGS: Enlarged cardiopericardial silhouette. Central vascular congestion. No pneumothorax or effusion. There is some linear opacity left midlung likely scar or atelectasis. IMPRESSION: Underinflation with enlarged cardiopericardial silhouette with vascular congestion. Subtle left midlung opacity as well. Recommend follow-up Electronically Signed   By: Jill Side M.D.   On: 05/15/2022 12:30    US LIVER DOPPLER  Result Date: 05/14/2022 CLINICAL DATA:  Liver failure EXAM: DUPLEX ULTRASOUND OF LIVER TECHNIQUE: Color and duplex Doppler ultrasound was performed to evaluate the hepatic in-flow and out-flow vessels. COMPARISON:  None Available. FINDINGS: Liver: Enlarged liver with heterogeneous and echogenic hepatic parenchyma. No discrete hepatic lesions are identified. No focal lesion, mass or intrahepatic biliary ductal dilatation. Main Portal Vein size: 1.2 cm Portal Vein Velocities Main Prox:  23 cm/sec with sluggish bidirectional flow. Main Mid: 36 cm/sec with sluggish bidirectional flow. Main Dist:  13 cm/sec with sluggish bidirectional flow. Right: 21 cm/sec with hepatopetal flow. Left: 47 cm/sec Hepatic Vein Velocities Right:  22 cm/sec Middle:  29 cm/sec Left:  24 cm/sec IVC: Present and patent with normal respiratory phasicity. Hepatic Artery Velocity:  105 cm/sec Splenic Vein Velocity:  14 cm/sec Spleen: 21 cm x 8.6 cm x 19.8 cm with a total volume of 1,889 cm^3 (411 cm^3 is upper limit normal) Portal Vein Occlusion/Thrombus: No Splenic Vein Occlusion/Thrombus: No Ascites: Trace ascites. Varices: Recanalized periumbilical vein visualized. IMPRESSION: 1. Hepatic cirrhosis with portal hypertension as evidence by sluggish bidirectional flow in the portal vein, recanalized periumbilical vein and significant splenomegaly. 2. Splenomegaly. 3. No discrete hepatic lesion. Signed, Criselda Peaches, MD, [facility] Electronically Signed   By: Jacqulynn Cadet M.D.   On: 05/14/2022 16:27   US Paracentesis  Result Date: 05/12/2022 INDICATION: Ascites, possible spontaneous bacterial peritonitis EXAM: ULTRASOUND GUIDED  PARACENTESIS MEDICATIONS: Lidocaine 1% subcutaneous COMPLICATIONS: None immediate. PROCEDURE: Informed written consent was obtained from the patient after a discussion of the risks, benefits and alternatives to treatment.  A timeout was performed prior to the initiation of the procedure. Initial ultrasound scanning demonstrates a small amount of infrahepatic ascites within the right lower abdominal quadrant. The right lower abdomen was prepped and draped in the usual sterile fashion. 1% lidocaine was used for local anesthesia. Following this, under real-time ultrasound guidance a Safe-T-Centesis catheter was introduced. An ultrasound image was saved for documentation purposes. The paracentesis was performed. Only scant volume obtained. The catheter was removed and a dressing was applied. The patient tolerated the procedure well without immediate post procedural complication. FINDINGS: A total of less than 5 mL clear yellow fluid was removed. Samples were sent to the laboratory as requested by the clinical team. IMPRESSION: Successful ultrasound-guided paracentesis yielding less than 5 mL of peritoneal  fluid. Electronically Signed   By: Lucrezia Europe M.D.   On: 05/12/2022 15:11   US ABDOMEN LIMITED RUQ (LIVER/GB)  Result Date: 05/11/2022 CLINICAL DATA:  Abdominal pain EXAM: ULTRASOUND ABDOMEN LIMITED RIGHT UPPER QUADRANT COMPARISON:  No prior right upper quadrant ultrasound available, correlation is made with 05/11/2022 CT abdomen pelvis FINDINGS: Gallbladder: Level sludge is noted within the gallbladder. No gallstones. The gallbladder is somewhat distended, with the wall measuring up to 4 mm, just above the upper limit of normal. No pericholecystic fluid. No sonographic Murphy sign noted by sonographer. Common bile duct: Diameter: 6 mm, within normal limits. No intrahepatic biliary ductal dilatation. Liver: No focal lesion identified. Increased parenchymal echogenicity, which limits sonographic penetration. Portal vein is patent on color Doppler imaging with likely reversal of blood flow, away from the liver. Other: None. IMPRESSION: 1. Gallbladder sludge and mild wall thickening, just above upper limit of normal. No pericholecystic  fluid or sonographic Murphy sign. Findings are equivocal for acute cholecystitis and may be related to the patient's history of cirrhosis. 2. Portal vein is patent with likely reversal of blood flow. Electronically Signed   By: Merilyn Baba M.D.   On: 05/11/2022 14:17   CT Abdomen Pelvis W Contrast  Result Date: 05/11/2022 CLINICAL DATA:  Abdominal pain EXAM: CT ABDOMEN AND PELVIS WITH CONTRAST TECHNIQUE: Multidetector CT imaging of the abdomen and pelvis was performed using the standard protocol following bolus administration of intravenous contrast. RADIATION DOSE REDUCTION: This exam was performed according to the departmental dose-optimization program which includes automated exposure control, adjustment of the mA and/or kV according to patient size and/or use of iterative reconstruction technique. CONTRAST:  100 mL OMNIPAQUE IOHEXOL 300 MG/ML  SOLN COMPARISON:  03/12/2022 FINDINGS: Lower chest: Bibasilar linear subsegmental atelectasis. No pleural or pericardial effusion identified. Perigastric and perisplenic varices. Hepatobiliary: Distended gallbladder. Enlarged liver with a slightly nodular contour. No focal hepatic parenchymal lesions or biliary ductal dilatation identified. Pancreas: Unremarkable. No pancreatic ductal dilatation or surrounding inflammatory changes. Spleen: Grossly enlarged, 19 cm Adrenals/Urinary Tract: Adrenal glands are unremarkable. Kidneys are normal, without renal calculi, focal lesion, or hydronephrosis. Bladder is unremarkable. Stomach/Bowel: Stomach is within normal limits. Appendix appears normal. No evidence of bowel wall thickening, distention, or inflammatory changes. Vascular/Lymphatic: No suspicious adenopathy identified. No aortic aneurysm. Reproductive: Prostate is unremarkable. Other: No abdominal wall hernia or abnormality. Small amounts of ascites noted along the pericolic gutters And in the pelvis. Musculoskeletal: No acute or significant osseous findings.  IMPRESSION: 1. Hepatosplenomegaly. 2. Hepatic cirrhosis. 3. Distended gallbladder. 4. Perigastric and perisplenic varices. 5. Small ascites. Electronically Signed   By: Sammie Bench M.D.   On: 05/11/2022 12:05    Labs:  CBC: Recent Labs    05/20/22 0446 05/22/22 0708 05/23/22 0453 05/24/22 0635  WBC 19.4* 29.1* 24.8* 26.6*  HGB 10.1* 10.3* 8.8* 8.8*  HCT 28.6* 28.5* 24.8* 24.4*  PLT 207 250 210 203    COAGS: Recent Labs    12/31/21 0447 05/12/22 1155 05/14/22 0637 05/21/22 1053  INR 1.6* 2.7* 3.2* 2.4*    BMP: Recent Labs    05/20/22 0446 05/22/22 0708 05/23/22 0453 05/24/22 0635  NA 126* 127* 128* 126*  K 3.5 3.6 3.5 3.6  CL 89* 88* 85* 88*  CO2 29 29 29 28   GLUCOSE 138* 108* 125* 118*  BUN 26* 27* 28* 28*  CALCIUM 7.7* 7.9* 8.1* 7.9*  CREATININE 0.69 0.64 0.64 0.56*  GFRNONAA >60 >60 >60 >60    LIVER FUNCTION TESTS:  Recent Labs    05/20/22 0446 05/22/22 0708 05/23/22 0453 05/24/22 0635  BILITOT 22.9* 24.6* 21.0* 21.5*  AST 107* 136* 125* 136*  ALT 25 45* 47* 52*  ALKPHOS 101 127* 118 136*  PROT 7.6 8.1 7.1 7.3  ALBUMIN 2.0* 2.2* 2.0* 2.1*    TUMOR MARKERS: No results for input(s): "AFPTM", "CEA", "CA199", "CHROMGRNA" in the last 8760 hours.  Assessment and Plan: Cameron Santiago is a 27 yo male being seen today in relation to a newly discovered fluid collection of the left thigh. Imaging has been inconclusive as to whether this fluid collection is a hematoma, an infection, or another process. Subsequently, patient has been referred to IR for image-guided left thigh fluid aspiration with possible drain placement under moderate sedation. Case reviewed and approved by Dr Denna Haggard and is tentatively scheduled for 05/25/22. Per the patient, he is NPO except for sips of water with medication this morning.   Risks and benefits discussed with the patient including bleeding, infection, fistula formation, damage to adjacent structures, and sepsis.  All of  the patient's questions were answered, patient is agreeable to proceed. Consent signed and in chart.   Thank you for this interesting consult.  I greatly enjoyed meeting Cameron Santiago and look forward to participating in their care.  A copy of this report was sent to the requesting provider on this date.  Electronically Signed: Lura Em, PA-C 05/25/2022, 11:11 AM   I spent a total of 40 Minutes    in face to face in clinical consultation, greater than 50% of which was counseling/coordinating care for left thigh fluid collection.

## 2022-05-25 NOTE — OR Nursing (Signed)
Transferred to 110 hand off with nurse Montey Hora RN. Wife at bedside.

## 2022-05-26 DIAGNOSIS — Z7189 Other specified counseling: Secondary | ICD-10-CM

## 2022-05-26 DIAGNOSIS — Z515 Encounter for palliative care: Secondary | ICD-10-CM

## 2022-05-26 DIAGNOSIS — D638 Anemia in other chronic diseases classified elsewhere: Secondary | ICD-10-CM | POA: Insufficient documentation

## 2022-05-26 LAB — CBC
HCT: 22.9 % — ABNORMAL LOW (ref 39.0–52.0)
Hemoglobin: 8.1 g/dL — ABNORMAL LOW (ref 13.0–17.0)
MCH: 38.4 pg — ABNORMAL HIGH (ref 26.0–34.0)
MCHC: 35.4 g/dL (ref 30.0–36.0)
MCV: 108.5 fL — ABNORMAL HIGH (ref 80.0–100.0)
Platelets: 186 10*3/uL (ref 150–400)
RBC: 2.11 MIL/uL — ABNORMAL LOW (ref 4.22–5.81)
RDW: 16.9 % — ABNORMAL HIGH (ref 11.5–15.5)
WBC: 24.3 10*3/uL — ABNORMAL HIGH (ref 4.0–10.5)
nRBC: 0 % (ref 0.0–0.2)

## 2022-05-26 LAB — COMPREHENSIVE METABOLIC PANEL
ALT: 78 U/L — ABNORMAL HIGH (ref 0–44)
AST: 195 U/L — ABNORMAL HIGH (ref 15–41)
Albumin: 2 g/dL — ABNORMAL LOW (ref 3.5–5.0)
Alkaline Phosphatase: 147 U/L — ABNORMAL HIGH (ref 38–126)
Anion gap: 9 (ref 5–15)
BUN: 30 mg/dL — ABNORMAL HIGH (ref 6–20)
CO2: 28 mmol/L (ref 22–32)
Calcium: 7.8 mg/dL — ABNORMAL LOW (ref 8.9–10.3)
Chloride: 90 mmol/L — ABNORMAL LOW (ref 98–111)
Creatinine, Ser: 0.64 mg/dL (ref 0.61–1.24)
GFR, Estimated: >60 mL/min (ref >60)
Glucose, Bld: 124 mg/dL — ABNORMAL HIGH (ref 70–99)
Potassium: 3.2 mmol/L — ABNORMAL LOW (ref 3.5–5.1)
Sodium: 127 mmol/L — ABNORMAL LOW (ref 135–145)
Total Bilirubin: 20.2 mg/dL (ref 0.3–1.2)
Total Protein: 6.9 g/dL (ref 6.5–8.1)

## 2022-05-26 LAB — AMMONIA: Ammonia: 38 umol/L — ABNORMAL HIGH (ref 9–35)

## 2022-05-26 MED ORDER — POTASSIUM CHLORIDE CRYS ER 20 MEQ PO TBCR
40.0000 meq | EXTENDED_RELEASE_TABLET | Freq: Once | ORAL | Status: AC
  Administered 2022-05-26: 40 meq via ORAL
  Filled 2022-05-26: qty 2

## 2022-05-26 MED ORDER — OXYCODONE HCL 5 MG PO TABS
10.0000 mg | ORAL_TABLET | ORAL | Status: DC | PRN
  Administered 2022-05-26 – 2022-06-02 (×18): 10 mg via ORAL
  Filled 2022-05-26 (×18): qty 2

## 2022-05-26 MED ORDER — PHYTONADIONE 5 MG PO TABS
5.0000 mg | ORAL_TABLET | Freq: Every day | ORAL | Status: AC
  Administered 2022-05-26 – 2022-05-28 (×3): 5 mg via ORAL
  Filled 2022-05-26 (×3): qty 1

## 2022-05-26 NOTE — TOC Progression Note (Signed)
Transition of Care [facility]) - Progression Note    Patient Details  Name: Cameron Santiago MRN: [id number] Date of Birth: Sep 18, 1995  Transition of [facility]) CM/SW Contact  Gerilyn Pilgrim, LCSW Phone Number: 05/26/2022, 10:28 AM  Clinical Narrative:   Pt having leg aspiration on 4/1. TOC continuing to follow for careplan needs and changes.          Expected Discharge Plan and Services                                               Social Determinants of Health (SDOH) Interventions SDOH Screenings   Food Insecurity: No Food Insecurity (05/11/2022)  Housing: Low Risk  (05/11/2022)  Transportation Needs: Unmet Transportation Needs (05/11/2022)  Utilities: Not At Risk (05/11/2022)  Tobacco Use: Low Risk  (05/11/2022)    Readmission Risk Interventions     No data to display

## 2022-05-26 NOTE — Consult Note (Signed)
Consultation Note Date: 05/26/2022   Patient Name: Cameron Santiago  DOB: 1995-07-01  MRN: [id number]  Age / Sex: 27 y.o., male  PCP: Pcp, No Referring Physician: Loletha Grayer, MD  Reason for Consultation: goals of care  HPI/Patient Profile: 27 y.o. male  with past medical history of alcoholic liver cirrhosis, HTN admitted on 05/11/2022 with abdominal distention and abdominal pain. Workup revealed decompensated hepatic failure with ascites, portal HTN. He was started on steroids and showed improvement on steroids. GI spoke with transplant team and he is not eligible for transplant due to recurrent alcoholic pancreatitis. Admission complicated by L thigh pain. Fluid collection on CT scan evaluated and aspirated by IR- likely hematoma. Palliative medicine consulted for [place].   Primary Decision Maker PATIENT - if he were unable he would wish for his mother and/or "the mother of his children" to be his surrogate decision maker. Reviewed that per [place] statutes his mom is legal surrogate, if he wishes for the mother of his children to be surrogate recommend HCPOA document which he declined.   Discussion: Chart reviewed including labs, progress notes, imaging from this and previous encounters.  Met with patient and his mother at the bedside.  He lives at home, has two children, works in [employer].  Reviewed his current illness, possible trajectories, complications that may occur over time as his illness progresses.  He shares that his [place] is to abstain from alcohol for six months and then be re-evaluated for transplant. He notes that he is very motivate to abstain due to his desire to spend time with his children.  We discussed that alcoholic cirrhosis is reversible if a patient stops using alcohol. However, he is at high risk for complications and death in the interim. Discussed advanced care planning and his wishes  regarding advanced life prolonging measures. He would be accepting of artificial life support at this time, but notes that if his quality of life were greatly decreased and he was unable to be functional then he would consider changing his mind. He notes that his family understands his wishes.   We discussed symptoms- he had no complaints that he felt needed to be addressed. His mood is stable, no uncontrolled pain.  We discussed outpatient Palliative, he declined.   SUMMARY OF RECOMMENDATIONS -Full scope, full code    Code Status/Advance Care Planning: Full code   Prognosis:   Unable to determine  Discharge Planning: To Be Determined  Primary Diagnoses: Present on Admission:  Alcoholic hepatitis with ascites  Alcohol dependence with withdrawal  Alcoholic cirrhosis  GERD (gastroesophageal reflux disease)  Elevated bilirubin  Obesity (BMI 30-39.9)  Hypokalemia  Leukocytosis   Review of Systems  Physical Exam  Vital Signs: BP 124/64 (BP Location: Left Arm)   Pulse 83   Temp 98.3 F (36.8 C) (Oral)   Resp 18   Ht 5\' 11"  (1.803 m)   Wt 116.4 kg   SpO2 98%   BMI 35.79 kg/m  Pain Scale: 0-10   Pain Score: 10-Worst pain ever  SpO2: SpO2: 98 % O2 Device:SpO2: 98 % O2 Flow Rate: .O2 Flow Rate (L/min): 2 L/min  IO: Intake/output summary:  Intake/Output Summary (Last 24 hours) at 05/26/2022 1356 Last data filed at 05/26/2022 1017 Gross per 24 hour  Intake 0 ml  Output --  Net 0 ml    LBM: Last BM Date : 05/25/22 Baseline Weight: Weight: 122.9 kg Most recent weight: Weight: 116.4 kg       Thank you for this consult. Palliative medicine will continue to follow and assist as needed.   Greater than 50%  of this time was spent counseling and coordinating care related to the above assessment and plan.  Signed by: Mariana Kaufman, AGNP-C Palliative Medicine    Please contact Palliative Medicine Team phone at [phone] for questions and concerns.  For individual  provider: See Shea Evans

## 2022-05-26 NOTE — Assessment & Plan Note (Signed)
Review of chronic disease plus also blood loss into left leg.  Last hemoglobin 8.1.  Continue to monitor hemoglobin.

## 2022-05-26 NOTE — Progress Notes (Signed)
Progress Note   Patient: Cameron Santiago [id number] DOB: January 15, 1996 DOA: 05/11/2022     15 DOS: the patient was seen and examined on 05/26/2022   Brief hospital course: This 27 y.o. male with PMH significant for EtOH liver cirrhosis, ongoing EtOH use, Essential hypertension, presented in the ED with abdominal distention associated with abdominal pain for last few days.  Patient drinks  ETOH regularly, 1 pint of hard liquor every day.  Patient denies any nausea,  vomiting. Patient also reports subjective fever at night with chills, He denies any GI bleeding, dizziness, weakness, urinary symptoms, denies any recent travel or  URI symptoms.  03/18: ED workup: Sodium 131, potassium 2.5, anion gap 13, magnesium 1.8, alkaline phosphatase 107, albumin 2.1, lipase 75, AST 169, ALT 21, ALT 9.4, total bilirubin 28.4, total protein 9.4, WBC 14.4, hemoglobin 10.9, hematocrit 31.2, MCV 104.7, platelet 192, influenza negative, COVID-negative, RSV negative, UA: Ketones negative, LE negative, nitrate negative. CTA/P:  Hepatosplenomegaly, Hepatic cirrhosis, Distended gallbladder, Perigastric and perisplenic varices. Small ascites. Admitted for treatment and monitoring EtOH withdrawal  03/19:  GI saw patient. NPO 03/20: Advance to CLD. Pt is concerned about EtOH w/drawal, remains on CIWA protocol  03/21: liver doppler (+)cirrhosis and portal HTN, advancing to full liquids.  03/22: advancing diet. (+)cough and rales, CXR showing cardiomegaly and vascular congestion --> lasix + albumin, Echo to eval further.  03/23: no concerns on Echocardiogram w/ EF 60-65% no RWMA and no diastolic df, diuresed well yesterday will repeat Lasix today. Dr Allen Norris w/ GI checked up on previous paracentesis labs and no SBP, ok to start steroids, he will be placing these orders. Given deterioration on labs / increased MELD score, Dr Allen Norris also spoke w/ [facility] transplant team - given previous pancreatitis treatment, patient is not a transplant  candidate.  03/24: hyponatremia worse to 123, reduced spironolactone. GI following. D/c abx. Bili slightly improved on steroids. GI and hospitalist teams have discussed that he is not a transplant candidate - see GI note from today.  03/25: sodium improved to 126. Bili slightly improved as well to 25. Continuing steroids. Per GI, "advanced liver cirrhosis likely from alcohol abuse. His other labs did not show any cause for his cirrhosis. The patient should continue with supportive care. Nothing further to do from a GI point of view at this time." 03/26: bilirubin down to 24, sodium 128, WBC increased to 18 w/ neutrophils, likely d/t steroids however pt reports subjective fever/chills, no cough, abdominal pain is stable. Getting CXR given some concern for infiltrate on previous but no obvious pneumonia, low threshold for restarting ceftriaxone, he declined paracentesis but may need to revisit this. Will leave on steroids for now, low suspicion for infection. Increased lasix. 3/27.  Patient hesitant about going home.  I will uptitrate spironolactone so hopefully we can get off potassium supplementation.  Declined another paracentesis. 3/28.  Patient does not feel well today.  Did not sleep well.  Declined paracentesis. 3/29.  Patient feels worse today with nausea vomiting, abdominal pain.  Patient also has left leg pain left lateral thigh.  Patient declined paracentesis.  Patient declined MRI of his abdomen and or his thigh.  Will continue to monitor.  With temperature dropping to 95.5 will start empiric antibiotics. 3/30.  White blood cell count down slightly to 24.8.  Patient still having pain in the left leg.  Ultrasound left lower extremity negative for DVT.  Wondering if pain is secondary to high-dose steroids.  Will start to taper prednisone  for tomorrow down to 30 mg daily.  CK normal range.  Patient declined MRI abdomen or MRI left leg. 3/31.  Patient still not feeling well.  Asking about prognosis.   Left leg pain persists.  MRI of the leg showed a large fluid collection likely hematoma 4/1.  Spoke with interventional radiology team.  They got an ultrasound and did a fluid aspiration and they think it is hematoma.  Red blood cells 11,413.  White blood cell count 2817 with 72% lymphocytes.  On empiric Rocephin.  Await Gram stain and culture. 4/2.  Gram stain still pending but culture shows no growth after 1 day.  Patient still having a lot of pain and increased oxycodone to 10 mg as needed.  Procedures: 05/11/22 Paracentesis  05/25/2022.  Leg aspiration.             Assessment and Plan: * Alcoholic hepatitis with ascites Continue prednisone but with his leg pain and will taper down to 30 mg daily.  White blood cell count likely elevated with steroids but could also be infection.  Started empiric Rocephin on 3/29 since the patient had a low temperature.  Leg cultures so far negative.  Acute pain of left thigh MRI left leg showed a large fluid collection.  Ultrasound negative for DVT.  CK and uric acid normal.  Interventional radiology did a drainage on 4/1.Marland Kitchen  They felt it was most likely hematoma.  Red blood cells 11,413 but white blood cells also elevated at 2817.  Lovenox discontinued on 3/31.  Heating pad.  Pain control with oral and IV medications.  Will give vitamin K orally for 3 days.  Leukocytosis Worsening leukocytosis on 3/29.  Empiric Rocephin started on 3/29.  White blood cell count still elevated at 24.3.  MRI left leg showed large fluid collection which is likely hematoma that was drained.  Gram stain still pending.  So far cultures negative.  Acute hepatic encephalopathy Continue lactulose for ammonia level of 70.  Spoke with patient about the goal bowel movement is 3/day.  Ammonia level 38 today.  Alcoholic cirrhosis Advised patient that he must stop drinking in order to be a candidate for a liver transplant.  Calculated Childs class Pugh score of 13 which would be  class C which would be a 65% mortality within 2 years.  Palliative care consultation.  Elevated bilirubin Bilirubin still very high at 20.2.  This may improve over time.  Alcohol dependence with withdrawal No current signs of any withdrawal.  Hypokalemia With spironolactone at 100 mg daily.  Replace potassium today.  Anemia of chronic disease Review of chronic disease plus also blood loss into left leg.  Last hemoglobin 8.1.  Continue to monitor hemoglobin.  Hypocalcemia Corrected calcium normal range.  Hyponatremia Likely secondary to alcohol and cirrhosis  Chronic diastolic CHF (congestive heart failure) Likely secondary to liver cirrhosis and anasarca.  Obesity (BMI 30-39.9) BMI 35.79        Subjective: Patient still complaining of a lot of left leg pain and swelling.  Had hematoma drainage on 4/1.  Physical Exam: Vitals:   05/25/22 1258 05/25/22 1555 05/25/22 2100 05/26/22 0450  BP: 139/70 (!) 132/57 138/65 124/64  Pulse: 92 89 89 83  Resp: (!) 22 16 18 18   Temp:  98.7 F (37.1 C) 98.4 F (36.9 C) 98.3 F (36.8 C)  TempSrc:   Oral Oral  SpO2: 92% 99% 98% 98%  Weight:      Height:  Physical Exam HENT:     Head: Normocephalic.     Mouth/Throat:     Pharynx: No oropharyngeal exudate.  Eyes:     General: Lids are normal.     Comments: Conjunctiva icteric  Cardiovascular:     Rate and Rhythm: Normal rate and regular rhythm.     Heart sounds: Normal heart sounds, S1 normal and S2 normal.  Pulmonary:     Breath sounds: Examination of the right-lower field reveals decreased breath sounds. Examination of the left-lower field reveals decreased breath sounds. Decreased breath sounds present. No wheezing, rhonchi or rales.  Abdominal:     General: There is distension.     Palpations: Abdomen is soft.     Tenderness: There is no abdominal tenderness.  Musculoskeletal:     Left upper leg: Swelling present.     Right lower leg: Swelling present.     Left  lower leg: Swelling present.     Comments: Patient complains of pain in his lateral left thigh and top of left thigh.  No masses felt.  Skin:    General: Skin is warm.     Coloration: Skin is jaundiced.  Neurological:     Mental Status: He is alert and oriented to person, place, and time.     Data Reviewed: Sodium 127, potassium 3.2, creatinine 0.64, total bilirubin 20.2, AST 195, ALT 78, white blood cell count 24.3, hemoglobin 8.1, platelet count 186 Gram stain still pending, fluid culture negative so far.  Family Communication: Permission to speak in front of mother at bedside  Disposition: Status is: Inpatient Remains inpatient appropriate because: Still having quite a bit of pain in his left leg.  Planned Discharge Destination: Home    Time spent: 28 minutes  Author: Loletha Grayer, MD 05/26/2022 12:00 PM  For on call review [email or web address].

## 2022-05-27 LAB — COMPREHENSIVE METABOLIC PANEL
ALT: 82 U/L — ABNORMAL HIGH (ref 0–44)
AST: 171 U/L — ABNORMAL HIGH (ref 15–41)
Albumin: 2.1 g/dL — ABNORMAL LOW (ref 3.5–5.0)
Alkaline Phosphatase: 157 U/L — ABNORMAL HIGH (ref 38–126)
Anion gap: 9 (ref 5–15)
BUN: 27 mg/dL — ABNORMAL HIGH (ref 6–20)
CO2: 26 mmol/L (ref 22–32)
Calcium: 7.9 mg/dL — ABNORMAL LOW (ref 8.9–10.3)
Chloride: 92 mmol/L — ABNORMAL LOW (ref 98–111)
Creatinine, Ser: 0.64 mg/dL (ref 0.61–1.24)
GFR, Estimated: >60 mL/min (ref >60)
Glucose, Bld: 109 mg/dL — ABNORMAL HIGH (ref 70–99)
Potassium: 3.6 mmol/L (ref 3.5–5.1)
Sodium: 127 mmol/L — ABNORMAL LOW (ref 135–145)
Total Bilirubin: 18.8 mg/dL (ref 0.3–1.2)
Total Protein: 6.9 g/dL (ref 6.5–8.1)

## 2022-05-27 LAB — CBC
HCT: 23.1 % — ABNORMAL LOW (ref 39.0–52.0)
Hemoglobin: 8.2 g/dL — ABNORMAL LOW (ref 13.0–17.0)
MCH: 38.5 pg — ABNORMAL HIGH (ref 26.0–34.0)
MCHC: 35.5 g/dL (ref 30.0–36.0)
MCV: 108.5 fL — ABNORMAL HIGH (ref 80.0–100.0)
Platelets: 178 10*3/uL (ref 150–400)
RBC: 2.13 MIL/uL — ABNORMAL LOW (ref 4.22–5.81)
RDW: 16.5 % — ABNORMAL HIGH (ref 11.5–15.5)
WBC: 25.1 10*3/uL — ABNORMAL HIGH (ref 4.0–10.5)
nRBC: 0 % (ref 0.0–0.2)

## 2022-05-27 LAB — TYPE AND SCREEN
ABO/RH(D): O POS
Antibody Screen: NEGATIVE

## 2022-05-27 MED ORDER — METHOCARBAMOL 500 MG PO TABS
500.0000 mg | ORAL_TABLET | Freq: Three times a day (TID) | ORAL | Status: DC
  Administered 2022-05-27 – 2022-06-02 (×19): 500 mg via ORAL
  Filled 2022-05-27 (×19): qty 1

## 2022-05-27 MED ORDER — ENSURE ENLIVE PO LIQD
237.0000 mL | Freq: Two times a day (BID) | ORAL | Status: DC
  Administered 2022-05-27 – 2022-05-29 (×3): 237 mL via ORAL

## 2022-05-27 MED ORDER — MELATONIN 5 MG PO TABS
5.0000 mg | ORAL_TABLET | Freq: Every day | ORAL | Status: DC
  Administered 2022-05-27 – 2022-06-01 (×6): 5 mg via ORAL
  Filled 2022-05-27 (×6): qty 1

## 2022-05-27 NOTE — Progress Notes (Signed)
Palliative-  Chart reviewed.  Patient declines further follow-up from Palliative.  Will sign off for now. Please feel free to re-consult if further assistance is needed.   Mariana Kaufman, AGNP-C Palliative Medicine  No charge

## 2022-05-27 NOTE — Progress Notes (Signed)
PROGRESS NOTE    Cameron Santiago  [id number] DOB: 19-Jun-1995 DOA: 05/11/2022 PCP: Pcp, No    Brief Narrative:  27 y.o. male with PMH significant for EtOH liver cirrhosis, ongoing EtOH use, Essential hypertension, presented in the ED with abdominal distention associated with abdominal pain for last few days.  Patient drinks  ETOH regularly, 1 pint of hard liquor every day.  Patient denies any nausea,  vomiting. Patient also reports subjective fever at night with chills, He denies any GI bleeding, dizziness, weakness, urinary symptoms, denies any recent travel or  URI symptoms.  03/18: ED workup: Sodium 131, potassium 2.5, anion gap 13, magnesium 1.8, alkaline phosphatase 107, albumin 2.1, lipase 75, AST 169, ALT 21, ALT 9.4, total bilirubin 28.4, total protein 9.4, WBC 14.4, hemoglobin 10.9, hematocrit 31.2, MCV 104.7, platelet 192, influenza negative, COVID-negative, RSV negative, UA: Ketones negative, LE negative, nitrate negative. CTA/P:  Hepatosplenomegaly, Hepatic cirrhosis, Distended gallbladder, Perigastric and perisplenic varices. Small ascites. Admitted for treatment and monitoring EtOH withdrawal  03/19:  GI saw patient. NPO 03/20: Advance to CLD. Pt is concerned about EtOH w/drawal, remains on CIWA protocol  03/21: liver doppler (+)cirrhosis and portal HTN, advancing to full liquids.  03/22: advancing diet. (+)cough and rales, CXR showing cardiomegaly and vascular congestion --> lasix + albumin, Echo to eval further.  03/23: no concerns on Echocardiogram w/ EF 60-65% no RWMA and no diastolic df, diuresed well yesterday will repeat Lasix today. Dr Allen Norris w/ GI checked up on previous paracentesis labs and no SBP, ok to start steroids, he will be placing these orders. Given deterioration on labs / increased MELD score, Dr Allen Norris also spoke w/ [facility] transplant team - given previous pancreatitis treatment, patient is not a transplant candidate.  03/24: hyponatremia worse to 123, reduced  spironolactone. GI following. D/c abx. Bili slightly improved on steroids. GI and hospitalist teams have discussed that he is not a transplant candidate - see GI note from today.  03/25: sodium improved to 126. Bili slightly improved as well to 25. Continuing steroids. Per GI, "advanced liver cirrhosis likely from alcohol abuse. His other labs did not show any cause for his cirrhosis. The patient should continue with supportive care. Nothing further to do from a GI point of view at this time." 03/26: bilirubin down to 24, sodium 128, WBC increased to 18 w/ neutrophils, likely d/t steroids however pt reports subjective fever/chills, no cough, abdominal pain is stable. Getting CXR given some concern for infiltrate on previous but no obvious pneumonia, low threshold for restarting ceftriaxone, he declined paracentesis but may need to revisit this. Will leave on steroids for now, low suspicion for infection. Increased lasix. 3/27.  Patient hesitant about going home.  I will uptitrate spironolactone so hopefully we can get off potassium supplementation.  Declined another paracentesis. 3/28.  Patient does not feel well today.  Did not sleep well.  Declined paracentesis. 3/29.  Patient feels worse today with nausea vomiting, abdominal pain.  Patient also has left leg pain left lateral thigh.  Patient declined paracentesis.  Patient declined MRI of his abdomen and or his thigh.  Will continue to monitor.  With temperature dropping to 95.5 will start empiric antibiotics. 3/30.  White blood cell count down slightly to 24.8.  Patient still having pain in the left leg.  Ultrasound left lower extremity negative for DVT.  Wondering if pain is secondary to high-dose steroids.  Will start to taper prednisone for tomorrow down to 30 mg daily.  CK normal  range.  Patient declined MRI abdomen or MRI left leg. 3/31.  Patient still not feeling well.  Asking about prognosis.  Left leg pain persists.  MRI of the leg showed a large  fluid collection likely hematoma 4/1.  Spoke with interventional radiology team.  They got an ultrasound and did a fluid aspiration and they think it is hematoma.  Red blood cells 11,413.  White blood cell count 2817 with 72% lymphocytes.  On empiric Rocephin.  Await Gram stain and culture. 4/2.  Gram stain still pending but culture shows no growth after 1 day.  Patient still having a lot of pain and increased oxycodone to 10 mg as needed. 4/3: Culture from aspirated fluid collection NGTD.  Pain control improving.  Still swollen  Assessment & Plan:   Principal Problem:   Alcoholic hepatitis with ascites Active Problems:   Leukocytosis   Acute pain of left thigh   Alcoholic cirrhosis   Acute hepatic encephalopathy   Alcohol dependence with withdrawal   Elevated bilirubin   Hypokalemia   GERD (gastroesophageal reflux disease)   Obesity (BMI 30-39.9)   Generalized abdominal pain   Chronic diastolic CHF (congestive heart failure)   Hyponatremia   Hypocalcemia   Anemia of chronic disease   Alcoholic hepatitis with ascites Continue prednisone 30 mg daily.  Continue Rocephin for today can likely discontinue within the next 24 hours if patient remains fever free.  Acute pain of left thigh MRI left leg showed a large fluid collection.  Ultrasound negative for DVT.  CK and uric acid normal.  Interventional radiology did a drainage on 4/1.Marland Kitchen  They felt it was most likely hematoma.  Red blood cells 11,413 but white blood cells also elevated at 2817.  Lovenox discontinued on 3/31.  Heating pad.  Pain control with oral and IV medications.  Continue p.o. vitamin K x 3 days.  Attempt to wean off IV narcotic.  Added muscle relaxant 4/3   leukocytosis Worsening leukocytosis on 3/29.  Empiric Rocephin started on 3/29.  White blood cell count still elevated at 24.3.  MRI left leg showed large fluid collection which is likely hematoma that was drained.  Gram stain still pending.  So far cultures negative.   Leukocytosis likely driven by steroids   Acute hepatic encephalopathy Continue lactulose for ammonia level of 70.  Spoke with patient about the goal bowel movement is 3/day.  Mental status intact   Alcoholic cirrhosis Advised patient that he must stop drinking in order to be a candidate for a liver transplant.  Calculated Childs class Pugh score of 13 which would be class C which would be a 65% mortality within 2 years.  Palliative care consultation.  GI spoke to transplant team at [facility].  At this time patient is not a candidate for liver transplantation due to recurrent alcoholic pancreatitis.  Guarded prognosis.   Elevated bilirubin Bilirubin remains elevated at 18 but downtrending.  May take weeks to improve   Alcohol dependence with withdrawal No current signs of any withdrawal.   Hypokalemia With spironolactone at 100 mg daily.  Monitor and replace as necessary   Anemia of chronic disease Review of chronic disease plus also blood loss into left leg.  Last hemoglobin 8.2.  Continue to monitor hemoglobin.  Check iron panel   Hypocalcemia Corrected calcium normal range.   Hyponatremia Likely secondary to alcohol and cirrhosis   Chronic diastolic CHF (congestive heart failure) Likely secondary to liver cirrhosis and anasarca.   Obesity (BMI 30-39.9) BMI  35.79   DVT prophylaxis: SCD Code Status: Full Family Communication: Mother at bedside 4/3 Disposition Plan: Status is: Inpatient Remains inpatient appropriate because: Decompensated alcoholic cirrhosis/anasarca   Level of care: Med-Surg  Consultants:  Palliative care GI, signed off  Procedures:  Paracentesis Thigh hematoma evacuation  Antimicrobials: Ceftriaxone   Subjective: Seen and examined.  Sitting comfortably in bed.  No visible distress.  Pain well-controlled at the moment.  Mother at bedside.  Objective: Vitals:   05/26/22 0450 05/26/22 1621 05/26/22 2147 05/27/22 0754  BP: 124/64 (!) 146/80 135/73  (!) 143/75  Pulse: 83 94 86 81  Resp: 18 18 17 20   Temp: 98.3 F (36.8 C) 98.2 F (36.8 C) 98 F (36.7 C) 98.4 F (36.9 C)  TempSrc: Oral Oral Oral Oral  SpO2: 98% 98% 99% 99%  Weight:      Height:        Intake/Output Summary (Last 24 hours) at 05/27/2022 1128 Last data filed at 05/27/2022 0131 Gross per 24 hour  Intake 720 ml  Output --  Net 720 ml   Filed Weights   05/11/22 0935 05/15/22 0512 05/16/22 0416  Weight: 122.9 kg 112.6 kg 116.4 kg    Examination:  General exam: NAD Respiratory system: Lungs clear.  Normal work of breathing.  Room air Cardiovascular system: S1-S2, RRR, no murmurs, no pedal edema Gastrointestinal system: Obese, soft, mild distention, positive bowel sounds Central nervous system: Alert and oriented. No focal neurological deficits. Extremities: Decreased power left lower extremity.  Decreased ROM.  Gait not assessed Skin: Diffusely icteric Psychiatry: Judgement and insight appear normal. Mood & affect appropriate.     Data Reviewed: I have personally reviewed following labs and imaging studies  CBC: Recent Labs  Lab 05/22/22 0708 05/23/22 0453 05/24/22 0635 05/26/22 0711 05/27/22 0439  WBC 29.1* 24.8* 26.6* 24.3* 25.1*  HGB 10.3* 8.8* 8.8* 8.1* 8.2*  HCT 28.5* 24.8* 24.4* 22.9* 23.1*  MCV 107.5* 107.8* 107.5* 108.5* 108.5*  PLT 250 210 203 186 0000000   Basic Metabolic Panel: Recent Labs  Lab 05/22/22 0708 05/23/22 0453 05/24/22 0635 05/26/22 0711 05/27/22 0439  NA 127* 128* 126* 127* 127*  K 3.6 3.5 3.6 3.2* 3.6  CL 88* 85* 88* 90* 92*  CO2 29 29 28 28 26   GLUCOSE 108* 125* 118* 124* 109*  BUN 27* 28* 28* 30* 27*  CREATININE 0.64 0.64 0.56* 0.64 0.64  CALCIUM 7.9* 8.1* 7.9* 7.8* 7.9*  MG  --   --  1.9  --   --   PHOS  --   --  3.7  --   --    GFR: Estimated Creatinine Clearance: 183.1 mL/min (by C-G formula based on SCr of 0.64 mg/dL). Liver Function Tests: Recent Labs  Lab 05/22/22 0708 05/23/22 0453 05/24/22 0635  05/26/22 0711 05/27/22 0439  AST 136* 125* 136* 195* 171*  ALT 45* 47* 52* 78* 82*  ALKPHOS 127* 118 136* 147* 157*  BILITOT 24.6* 21.0* 21.5* 20.2* 18.8*  PROT 8.1 7.1 7.3 6.9 6.9  ALBUMIN 2.2* 2.0* 2.1* 2.0* 2.1*   No results for input(s): "LIPASE", "AMYLASE" in the last 168 hours. Recent Labs  Lab 05/26/22 0711  AMMONIA 38*   Coagulation Profile: Recent Labs  Lab 05/21/22 1053  INR 2.4*   Cardiac Enzymes: Recent Labs  Lab 05/22/22 0705  CKTOTAL 34*   BNP (last 3 results) No results for input(s): "PROBNP" in the last 8760 hours. HbA1C: No results for input(s): "HGBA1C" in the last 72  hours. CBG: No results for input(s): "GLUCAP" in the last 168 hours. Lipid Profile: No results for input(s): "CHOL", "HDL", "LDLCALC", "TRIG", "CHOLHDL", "LDLDIRECT" in the last 72 hours. Thyroid Function Tests: No results for input(s): "TSH", "T4TOTAL", "FREET4", "T3FREE", "THYROIDAB" in the last 72 hours. Anemia Panel: No results for input(s): "VITAMINB12", "FOLATE", "FERRITIN", "TIBC", "IRON", "RETICCTPCT" in the last 72 hours. Sepsis Labs: No results for input(s): "PROCALCITON", "LATICACIDVEN" in the last 168 hours.  Recent Results (from the past 240 hour(s))  Body fluid culture w Gram Stain     Status: None (Preliminary result)   Collection Time: 05/25/22 12:40 PM   Specimen: Leg; Body Fluid  Result Value Ref Range Status   Specimen Description   Final    LEG LEFT Performed at [facility], [address] [place], [place] [address]    Special Requests   Final    Normal Performed at [facility], [place], [place] [address]    Gram Stain NO WBC SEEN NO ORGANISMS SEEN   Final   Culture   Final    NO GROWTH < 24 HOURS Performed at [facility], [place] [address] [place], [place] [address]    Report Status PENDING  Incomplete         Radiology Studies: [place] IMAGE GUIDED FLUID DRAIN BY CATHETER  Result Date: 05/25/2022 INDICATION:  Leg pain EXAM: Ultrasound-guided aspiration of left lower extremity fluid collection MEDICATIONS: Documented in the EMR ANESTHESIA/SEDATION: Moderate (conscious) sedation was employed during this procedure. A total of Versed 2 mg and Fentanyl 100 mcg was administered intravenously by the radiology nurse. Total intra-service moderate Sedation Time: 10 minutes. The patient's level of consciousness and vital signs were monitored continuously by radiology nursing throughout the procedure under my direct supervision. COMPLICATIONS: None immediate. PROCEDURE: Informed written consent was obtained from the patient after a thorough discussion of the procedural risks, benefits and alternatives. All questions were addressed. Maximal Sterile Barrier Technique was utilized including caps, mask, sterile gowns, sterile gloves, sterile drape, hand hygiene and skin antiseptic. A timeout was performed prior to the initiation of the procedure. The patient was placed supine on the exam table. Ultrasound of the left lower extremity left lateral thigh demonstrated complex but otherwise indeterminate fluid collection. Skin entry site was marked, and the overlying skin was prepped draped in the standard sterile fashion. Local analgesia was obtained with 1% lidocaine. Using ultrasound guidance, a 19 gauge Yueh catheter was advanced into the largest component of the fluid collection. Thin serosanguineous fluid was returned. A sample was sent to the lab for analysis. A total of approximately 65 mL of fluid was aspirated of similar composition. Drainage catheter was removed. Clean dressing was placed patient tolerated the procedure well without immediate complication. IMPRESSION: Successful ultrasound-guided aspiration of left lower extremity fluid collection, yielding approximately 65 mL of serosanguineous fluid. Sample sent for microbiology analysis. Findings are suggestive of liquefied hematoma. No drain placed. Electronically Signed   By:  Albin Felling M.D.   On: 05/25/2022 12:53        Scheduled Meds:  amLODipine  5 mg Oral Daily   diclofenac Sodium  2 g Topical QID   docusate sodium  100 mg Oral BID   folic acid  1 mg Oral Daily   furosemide  40 mg Oral BID WC   lactulose  15 g Oral BID   melatonin  5 mg Oral QHS   methocarbamol  500 mg Oral TID  multivitamin with minerals  1 tablet Oral Daily   phytonadione  5 mg Oral Daily   predniSONE  30 mg Oral Q breakfast   senna  1 tablet Oral BID   spironolactone  100 mg Oral Daily   thiamine  100 mg Oral Daily   Or   thiamine  100 mg Intravenous Daily   Continuous Infusions:  sodium chloride Stopped (05/26/22 0412)   sodium chloride Stopped (05/26/22 0412)   cefTRIAXone (ROCEPHIN)  IV Stopped (05/27/22 0122)     LOS: 16 days    Sidney Ace, MD [facility]   If 7PM-7AM, please contact night-coverage  05/27/2022, 11:28 AM

## 2022-05-28 LAB — CBC WITH DIFFERENTIAL/PLATELET
Abs Immature Granulocytes: 0.25 10*3/uL — ABNORMAL HIGH (ref 0.00–0.07)
Basophils Absolute: 0.1 10*3/uL (ref 0.0–0.1)
Basophils Relative: 0 %
Eosinophils Absolute: 0.3 10*3/uL (ref 0.0–0.5)
Eosinophils Relative: 1 %
HCT: 26 % — ABNORMAL LOW (ref 39.0–52.0)
Hemoglobin: 9.1 g/dL — ABNORMAL LOW (ref 13.0–17.0)
Immature Granulocytes: 1 %
Lymphocytes Relative: 8 %
Lymphs Abs: 1.9 10*3/uL (ref 0.7–4.0)
MCH: 38.4 pg — ABNORMAL HIGH (ref 26.0–34.0)
MCHC: 35 g/dL (ref 30.0–36.0)
MCV: 109.7 fL — ABNORMAL HIGH (ref 80.0–100.0)
Monocytes Absolute: 1.3 10*3/uL — ABNORMAL HIGH (ref 0.1–1.0)
Monocytes Relative: 5 %
Neutro Abs: 20.2 10*3/uL — ABNORMAL HIGH (ref 1.7–7.7)
Neutrophils Relative %: 85 %
Platelets: 188 10*3/uL (ref 150–400)
RBC: 2.37 MIL/uL — ABNORMAL LOW (ref 4.22–5.81)
RDW: 16.4 % — ABNORMAL HIGH (ref 11.5–15.5)
WBC: 24 10*3/uL — ABNORMAL HIGH (ref 4.0–10.5)
nRBC: 0 % (ref 0.0–0.2)

## 2022-05-28 LAB — COMPREHENSIVE METABOLIC PANEL
ALT: 93 U/L — ABNORMAL HIGH (ref 0–44)
AST: 200 U/L — ABNORMAL HIGH (ref 15–41)
Albumin: 2.2 g/dL — ABNORMAL LOW (ref 3.5–5.0)
Alkaline Phosphatase: 177 U/L — ABNORMAL HIGH (ref 38–126)
Anion gap: 9 (ref 5–15)
BUN: 23 mg/dL — ABNORMAL HIGH (ref 6–20)
CO2: 27 mmol/L (ref 22–32)
Calcium: 8.3 mg/dL — ABNORMAL LOW (ref 8.9–10.3)
Chloride: 91 mmol/L — ABNORMAL LOW (ref 98–111)
Creatinine, Ser: 0.61 mg/dL (ref 0.61–1.24)
GFR, Estimated: >60 mL/min (ref >60)
Glucose, Bld: 126 mg/dL — ABNORMAL HIGH (ref 70–99)
Potassium: 3.3 mmol/L — ABNORMAL LOW (ref 3.5–5.1)
Sodium: 127 mmol/L — ABNORMAL LOW (ref 135–145)
Total Bilirubin: 20.3 mg/dL (ref 0.3–1.2)
Total Protein: 7.5 g/dL (ref 6.5–8.1)

## 2022-05-28 LAB — BODY FLUID CULTURE W GRAM STAIN
Culture: NO GROWTH
Gram Stain: NONE SEEN
Special Requests: NORMAL

## 2022-05-28 MED ORDER — ENSURE MAX PROTEIN PO LIQD
11.0000 [oz_av] | Freq: Every day | ORAL | Status: DC
  Administered 2022-05-28 – 2022-05-29 (×2): 11 [oz_av] via ORAL

## 2022-05-28 NOTE — Progress Notes (Signed)
PROGRESS NOTE    Cameron Santiago  [id number] DOB: 1995-06-07 DOA: 05/11/2022 PCP: Pcp, No    Brief Narrative:  27 y.o. male with PMH significant for EtOH liver cirrhosis, ongoing EtOH use, Essential hypertension, presented in the ED with abdominal distention associated with abdominal pain for last few days.  Patient drinks  ETOH regularly, 1 pint of hard liquor every day.  Patient denies any nausea,  vomiting. Patient also reports subjective fever at night with chills, He denies any GI bleeding, dizziness, weakness, urinary symptoms, denies any recent travel or  URI symptoms.  03/18: ED workup: Sodium 131, potassium 2.5, anion gap 13, magnesium 1.8, alkaline phosphatase 107, albumin 2.1, lipase 75, AST 169, ALT 21, ALT 9.4, total bilirubin 28.4, total protein 9.4, WBC 14.4, hemoglobin 10.9, hematocrit 31.2, MCV 104.7, platelet 192, influenza negative, COVID-negative, RSV negative, UA: Ketones negative, LE negative, nitrate negative. CTA/P:  Hepatosplenomegaly, Hepatic cirrhosis, Distended gallbladder, Perigastric and perisplenic varices. Small ascites. Admitted for treatment and monitoring EtOH withdrawal  03/19:  GI saw patient. NPO 03/20: Advance to CLD. Pt is concerned about EtOH w/drawal, remains on CIWA protocol  03/21: liver doppler (+)cirrhosis and portal HTN, advancing to full liquids.  03/22: advancing diet. (+)cough and rales, CXR showing cardiomegaly and vascular congestion --> lasix + albumin, Echo to eval further.  03/23: no concerns on Echocardiogram w/ EF 60-65% no RWMA and no diastolic df, diuresed well yesterday will repeat Lasix today. Dr Allen Norris w/ GI checked up on previous paracentesis labs and no SBP, ok to start steroids, he will be placing these orders. Given deterioration on labs / increased MELD score, Dr Allen Norris also spoke w/ [facility] transplant team - given previous pancreatitis treatment, patient is not a transplant candidate.  03/24: hyponatremia worse to 123, reduced  spironolactone. GI following. D/c abx. Bili slightly improved on steroids. GI and hospitalist teams have discussed that he is not a transplant candidate - see GI note from today.  03/25: sodium improved to 126. Bili slightly improved as well to 25. Continuing steroids. Per GI, "advanced liver cirrhosis likely from alcohol abuse. His other labs did not show any cause for his cirrhosis. The patient should continue with supportive care. Nothing further to do from a GI point of view at this time." 03/26: bilirubin down to 24, sodium 128, WBC increased to 18 w/ neutrophils, likely d/t steroids however pt reports subjective fever/chills, no cough, abdominal pain is stable. Getting CXR given some concern for infiltrate on previous but no obvious pneumonia, low threshold for restarting ceftriaxone, he declined paracentesis but may need to revisit this. Will leave on steroids for now, low suspicion for infection. Increased lasix. 3/27.  Patient hesitant about going home.  I will uptitrate spironolactone so hopefully we can get off potassium supplementation.  Declined another paracentesis. 3/28.  Patient does not feel well today.  Did not sleep well.  Declined paracentesis. 3/29.  Patient feels worse today with nausea vomiting, abdominal pain.  Patient also has left leg pain left lateral thigh.  Patient declined paracentesis.  Patient declined MRI of his abdomen and or his thigh.  Will continue to monitor.  With temperature dropping to 95.5 will start empiric antibiotics. 3/30.  White blood cell count down slightly to 24.8.  Patient still having pain in the left leg.  Ultrasound left lower extremity negative for DVT.  Wondering if pain is secondary to high-dose steroids.  Will start to taper prednisone for tomorrow down to 30 mg daily.  CK normal  range.  Patient declined MRI abdomen or MRI left leg. 3/31.  Patient still not feeling well.  Asking about prognosis.  Left leg pain persists.  MRI of the leg showed a large  fluid collection likely hematoma 4/1.  Spoke with interventional radiology team.  They got an ultrasound and did a fluid aspiration and they think it is hematoma.  Red blood cells 11,413.  White blood cell count 2817 with 72% lymphocytes.  On empiric Rocephin.  Await Gram stain and culture. 4/2.  Gram stain still pending but culture shows no growth after 1 day.  Patient still having a lot of pain and increased oxycodone to 10 mg as needed. 4/3: Culture from aspirated fluid collection NGTD.  Pain control improving.  Still swollen 4/4: Bilirubin bumped up to 20  Assessment & Plan:   Principal Problem:   Alcoholic hepatitis with ascites Active Problems:   Leukocytosis   Acute pain of left thigh   Alcoholic cirrhosis   Acute hepatic encephalopathy   Alcohol dependence with withdrawal   Elevated bilirubin   Hypokalemia   GERD (gastroesophageal reflux disease)   Obesity (BMI 30-39.9)   Generalized abdominal pain   Chronic diastolic CHF (congestive heart failure)   Hyponatremia   Hypocalcemia   Anemia of chronic disease   Alcoholic hepatitis with ascites Continue prednisone 30 mg daily.  Will continue Rocephin for now.  Repeat abdominal ultrasound in a.m.  Acute pain of left thigh MRI left leg showed a large fluid collection.  Ultrasound negative for DVT.  CK and uric acid normal.  Interventional radiology did a drainage on 4/1.Marland Kitchen  They felt it was most likely hematoma.  Red blood cells 11,413 but white blood cells also elevated at 2817.  Lovenox discontinued on 3/31.  Heating pad.  Pain control with oral and IV medications.  Continue p.o. vitamin K x 3 days, now complete.  Attempt to wean off IV narcotic.  Continue muscle relaxant added 4/3   leukocytosis Worsening leukocytosis on 3/29.  Empiric Rocephin started on 3/29.  White blood cell count still elevated at 24.3.  MRI left leg showed large fluid collection which is likely hematoma that was drained.  Gram stain still pending.  So far  cultures negative.  Leukocytosis likely driven by steroids   Acute hepatic encephalopathy Continue lactulose for ammonia level of 70.  Spoke with patient about the goal bowel movement is 3/day.  Mental status intact   Alcoholic cirrhosis Advised patient that he must stop drinking in order to be a candidate for a liver transplant.  Calculated Childs class Pugh score of 13 which would be class C which would be a 65% mortality within 2 years.  Palliative care consultation.  GI spoke to transplant team at [facility].  At this time patient is not a candidate for liver transplantation due to recurrent alcoholic pancreatitis.  Guarded prognosis.  No further recommendations from palliative care.  Patient declines further follow-up   Elevated bilirubin Total bilirubin dropped 18 on 4/3, increased slightly to 20 on 4/4.  Repeat CMP in a.m.   Alcohol dependence with withdrawal No current signs of any withdrawal.   Hypokalemia With spironolactone at 100 mg daily.  Monitor and replace as necessary   Anemia of chronic disease Review of chronic disease plus also blood loss into left leg.  Last hemoglobin 8.2.  Continue to monitor hemoglobin.    Hypocalcemia Corrected calcium normal range.   Hyponatremia Likely secondary to alcohol and cirrhosis   Chronic diastolic CHF (  congestive heart failure) Likely secondary to liver cirrhosis and anasarca.   Obesity (BMI 30-39.9) BMI 35.79   DVT prophylaxis: SCD Code Status: Full Family Communication: Mother at bedside 4/3, 4/4 Disposition Plan: Status is: Inpatient Remains inpatient appropriate because: Decompensated alcoholic cirrhosis/anasarca   Level of care: Med-Surg  Consultants:  Palliative care GI, signed off  Procedures:  Paracentesis Thigh hematoma evacuation  Antimicrobials: Ceftriaxone   Subjective: Seen and examined.  Sitting in bed.  Appears fatigued.  Pain well-controlled.  Objective: Vitals:   05/27/22 1607 05/27/22 2102  05/28/22 0522 05/28/22 0816  BP: (!) 155/78 134/68 136/63 139/72  Pulse: 86 89 91 90  Resp: 17 18 16 15   Temp: 98.2 F (36.8 C) 98.1 F (36.7 C) 98.4 F (36.9 C) 98.7 F (37.1 C)  TempSrc: Oral Oral Oral Oral  SpO2: 100% 100% 100% 100%  Weight:      Height:        Intake/Output Summary (Last 24 hours) at 05/28/2022 1201 Last data filed at 05/27/2022 2130 Gross per 24 hour  Intake 240 ml  Output --  Net 240 ml   Filed Weights   05/11/22 0935 05/15/22 0512 05/16/22 0416  Weight: 122.9 kg 112.6 kg 116.4 kg    Examination:  General exam: No acute distress Respiratory system: Lungs clear.  Normal work of breathing.  Room air Cardiovascular system: S1-S2, RRR, no murmurs, no pedal edema Gastrointestinal system: Obese, soft, mild distention, positive bowel sounds Central nervous system: Alert and oriented. No focal neurological deficits. Extremities: Decreased power left lower extremity.  Decreased ROM.  Gait not assessed Skin: Diffusely jaundiced, icteric sclera Psychiatry: Judgement and insight appear normal. Mood & affect appropriate.     Data Reviewed: I have personally reviewed following labs and imaging studies  CBC: Recent Labs  Lab 05/23/22 0453 05/24/22 0635 05/26/22 0711 05/27/22 0439 05/28/22 0753  WBC 24.8* 26.6* 24.3* 25.1* 24.0*  NEUTROABS  --   --   --   --  20.2*  HGB 8.8* 8.8* 8.1* 8.2* 9.1*  HCT 24.8* 24.4* 22.9* 23.1* 26.0*  MCV 107.8* 107.5* 108.5* 108.5* 109.7*  PLT 210 203 186 178 0000000   Basic Metabolic Panel: Recent Labs  Lab 05/23/22 0453 05/24/22 0635 05/26/22 0711 05/27/22 0439 05/28/22 0753  NA 128* 126* 127* 127* 127*  K 3.5 3.6 3.2* 3.6 3.3*  CL 85* 88* 90* 92* 91*  CO2 29 28 28 26 27   GLUCOSE 125* 118* 124* 109* 126*  BUN 28* 28* 30* 27* 23*  CREATININE 0.64 0.56* 0.64 0.64 0.61  CALCIUM 8.1* 7.9* 7.8* 7.9* 8.3*  MG  --  1.9  --   --   --   PHOS  --  3.7  --   --   --    GFR: Estimated Creatinine Clearance: 183.1 mL/min  (by C-G formula based on SCr of 0.61 mg/dL). Liver Function Tests: Recent Labs  Lab 05/23/22 0453 05/24/22 0635 05/26/22 0711 05/27/22 0439 05/28/22 0753  AST 125* 136* 195* 171* 200*  ALT 47* 52* 78* 82* 93*  ALKPHOS 118 136* 147* 157* 177*  BILITOT 21.0* 21.5* 20.2* 18.8* 20.3*  PROT 7.1 7.3 6.9 6.9 7.5  ALBUMIN 2.0* 2.1* 2.0* 2.1* 2.2*   No results for input(s): "LIPASE", "AMYLASE" in the last 168 hours. Recent Labs  Lab 05/26/22 0711  AMMONIA 38*   Coagulation Profile: No results for input(s): "INR", "PROTIME" in the last 168 hours.  Cardiac Enzymes: Recent Labs  Lab 05/22/22 0705  CKTOTAL  34*   BNP (last 3 results) No results for input(s): "PROBNP" in the last 8760 hours. HbA1C: No results for input(s): "HGBA1C" in the last 72 hours. CBG: No results for input(s): "GLUCAP" in the last 168 hours. Lipid Profile: No results for input(s): "CHOL", "HDL", "LDLCALC", "TRIG", "CHOLHDL", "LDLDIRECT" in the last 72 hours. Thyroid Function Tests: No results for input(s): "TSH", "T4TOTAL", "FREET4", "T3FREE", "THYROIDAB" in the last 72 hours. Anemia Panel: No results for input(s): "VITAMINB12", "FOLATE", "FERRITIN", "TIBC", "IRON", "RETICCTPCT" in the last 72 hours. Sepsis Labs: No results for input(s): "PROCALCITON", "LATICACIDVEN" in the last 168 hours.  Recent Results (from the past 240 hour(s))  Body fluid culture w Gram Stain     Status: None   Collection Time: 05/25/22 12:40 PM   Specimen: Leg; Body Fluid  Result Value Ref Range Status   Specimen Description   Final    LEG LEFT Performed at [facility], [address], [place], [place] [address]    Special Requests   Final    Normal Performed at [facility], [place], [place] [address]    Gram Stain NO WBC SEEN NO ORGANISMS SEEN   Final   Culture   Final    NO GROWTH 3 DAYS Performed at [facility], [place] [address], [place], [place] [address]    Report Status  05/28/2022 FINAL  Final         Radiology Studies: No results found.      Scheduled Meds:  amLODipine  5 mg Oral Daily   diclofenac Sodium  2 g Topical QID   docusate sodium  100 mg Oral BID   feeding supplement  237 mL Oral BID BM   folic acid  1 mg Oral Daily   furosemide  40 mg Oral BID WC   lactulose  15 g Oral BID   melatonin  5 mg Oral QHS   methocarbamol  500 mg Oral TID   multivitamin with minerals  1 tablet Oral Daily   predniSONE  30 mg Oral Q breakfast   senna  1 tablet Oral BID   spironolactone  100 mg Oral Daily   thiamine  100 mg Oral Daily   Or   thiamine  100 mg Intravenous Daily   Continuous Infusions:  sodium chloride Stopped (05/26/22 0412)   sodium chloride Stopped (05/26/22 0412)   cefTRIAXone (ROCEPHIN)  IV 1 g (05/27/22 1500)     LOS: 17 days    Sidney Ace, MD [facility]   If 7PM-7AM, please contact night-coverage  05/28/2022, 12:01 PM

## 2022-05-29 ENCOUNTER — Inpatient Hospital Stay: Payer: Medicaid Other

## 2022-05-29 LAB — COMPREHENSIVE METABOLIC PANEL
ALT: 83 U/L — ABNORMAL HIGH (ref 0–44)
AST: 151 U/L — ABNORMAL HIGH (ref 15–41)
Albumin: 2.1 g/dL — ABNORMAL LOW (ref 3.5–5.0)
Alkaline Phosphatase: 194 U/L — ABNORMAL HIGH (ref 38–126)
Anion gap: 10 (ref 5–15)
BUN: 21 mg/dL — ABNORMAL HIGH (ref 6–20)
CO2: 27 mmol/L (ref 22–32)
Calcium: 8.2 mg/dL — ABNORMAL LOW (ref 8.9–10.3)
Chloride: 92 mmol/L — ABNORMAL LOW (ref 98–111)
Creatinine, Ser: 0.66 mg/dL (ref 0.61–1.24)
GFR, Estimated: >60 mL/min (ref >60)
Glucose, Bld: 155 mg/dL — ABNORMAL HIGH (ref 70–99)
Potassium: 3.9 mmol/L (ref 3.5–5.1)
Sodium: 129 mmol/L — ABNORMAL LOW (ref 135–145)
Total Bilirubin: 17.8 mg/dL — ABNORMAL HIGH (ref 0.3–1.2)
Total Protein: 6.9 g/dL (ref 6.5–8.1)

## 2022-05-29 LAB — CBC WITH DIFFERENTIAL/PLATELET
Abs Immature Granulocytes: 0.34 10*3/uL — ABNORMAL HIGH (ref 0.00–0.07)
Basophils Absolute: 0.1 10*3/uL (ref 0.0–0.1)
Basophils Relative: 0 %
Eosinophils Absolute: 0.4 10*3/uL (ref 0.0–0.5)
Eosinophils Relative: 2 %
HCT: 24.1 % — ABNORMAL LOW (ref 39.0–52.0)
Hemoglobin: 8.5 g/dL — ABNORMAL LOW (ref 13.0–17.0)
Immature Granulocytes: 2 %
Lymphocytes Relative: 7 %
Lymphs Abs: 1.7 10*3/uL (ref 0.7–4.0)
MCH: 38.6 pg — ABNORMAL HIGH (ref 26.0–34.0)
MCHC: 35.3 g/dL (ref 30.0–36.0)
MCV: 109.5 fL — ABNORMAL HIGH (ref 80.0–100.0)
Monocytes Absolute: 1.4 10*3/uL — ABNORMAL HIGH (ref 0.1–1.0)
Monocytes Relative: 6 %
Neutro Abs: 19.4 10*3/uL — ABNORMAL HIGH (ref 1.7–7.7)
Neutrophils Relative %: 83 %
Platelets: 165 10*3/uL (ref 150–400)
RBC: 2.2 MIL/uL — ABNORMAL LOW (ref 4.22–5.81)
RDW: 16 % — ABNORMAL HIGH (ref 11.5–15.5)
WBC: 23.3 10*3/uL — ABNORMAL HIGH (ref 4.0–10.5)
nRBC: 0 % (ref 0.0–0.2)

## 2022-05-29 MED ORDER — ENSURE ENLIVE PO LIQD
237.0000 mL | Freq: Three times a day (TID) | ORAL | Status: DC
  Administered 2022-05-29: 237 mL via ORAL

## 2022-05-29 MED ORDER — MORPHINE SULFATE (PF) 2 MG/ML IV SOLN
1.0000 mg | INTRAVENOUS | Status: DC | PRN
  Administered 2022-05-29: 1 mg via INTRAVENOUS
  Filled 2022-05-29: qty 1

## 2022-05-29 NOTE — Progress Notes (Signed)
Initial Nutrition Assessment  DOCUMENTATION CODES:   Obesity unspecified  INTERVENTION:   -Ensure Enlive po TID, each supplement provides 350 kcal and 20 grams of protein -MVI with minerals daily -Magic cup TID with meals, each supplement provides 290 kcal and 9 grams of protein  -Liberalize diet to 2 gram sodium for wider variety of meal selections  NUTRITION DIAGNOSIS:   Increased nutrient needs related to chronic illness (cirrhosis) as evidenced by estimated needs.  GOAL:   Patient will meet greater than or equal to 90% of their needs  MONITOR:   PO intake, Supplement acceptance  REASON FOR ASSESSMENT:   Consult Assessment of nutrition requirement/status  ASSESSMENT:   Pt with PMH significant for EtOH liver cirrhosis, ongoing EtOH use, Essential hypertension, presented in the ED with abdominal distention associated with abdominal pain for last few days PTA.  Patient drinks  ETOH regularly, 1 pint of hard liquor every day  Pt admitted with alcoholic hepatitis with ascites.   Reviewed I/O's: +480 ml x 24 hours and +6.7 L since 05/15/22   Pt lying in bed at time of visit. Pt reports feeling okay, has flat affect during visit. Noted pt significantly jaundiced, including his eyes. Noted pt numerous containers of outside food; pt reports that his mom has been bringing in food for pt eat (noted salads and fish at bedside). Pt reports consuming hospital meals as well. Noted meal completions 50-100%. Pt with two Ensure supplements at bedside; pt consumed about 25% of each supplement. Pt reports supplements are "okay, but I know they are good for the protein". Pt shares that he has early satiety, likely related to ascites.   Pt unsure if he has lost weight. Noted that pt with moderate edema and ascites. Reviewed wt hx; pt has experienced a 5.4% wt loss over the past 2 months, which is not significant for time frame.   Discussed importance of good meal and supplement intake to  promote healing. Pt is amenable to continue supplements. Discussed small, frequent meals and supplements to help maximize intake secondary to early satiety related to ascites.   Palliative care has signed off; pt desire full scope care. Pt understanding that he needs to abstain for alcohol to qualify for a liver transplant.   Medications reviewed and include folic acid, lasix, lactulose, prednisone, senokot, and thiamine.   Lab Results  Component Value Date   HGBA1C 5.2 12/28/2021   PTA DM medications are none.   Labs reviewed: Na: 129, CBGS: 95 (inpatient orders for glycemic control are none).    NUTRITION - FOCUSED PHYSICAL EXAM:  Flowsheet Row Most Recent Value  Orbital Region No depletion  Upper Arm Region No depletion  Thoracic and Lumbar Region No depletion  Buccal Region No depletion  Temple Region No depletion  Clavicle Bone Region No depletion  Clavicle and Acromion Bone Region No depletion  Scapular Bone Region No depletion  Dorsal Hand No depletion  Patellar Region No depletion  Anterior Thigh Region No depletion  Posterior Calf Region No depletion  Edema (RD Assessment) Moderate  Hair Reviewed  Eyes Reviewed  Mouth Reviewed  Skin Reviewed  Nails Reviewed       Diet Order:   Diet Order             Diet 2 gram sodium Fluid consistency: Thin  Diet effective now                   EDUCATION NEEDS:   Education needs have been  addressed  Skin:  Skin Assessment: Reviewed RN Assessment  Last BM:  05/29/22  Height:   Ht Readings from Last 1 Encounters:  05/11/22 5\' 11"  (1.803 m)    Weight:   Wt Readings from Last 1 Encounters:  05/16/22 116.4 kg    Ideal Body Weight:  78.2 kg  BMI:  Body mass index is 35.79 kg/m.  Estimated Nutritional Needs:   Kcal:  [id number]  Protein:  115-130 grams  Fluid:  > 2 L    Levada SchillingJenifer W, RD, LDN, CDCES Registered Dietitian II Certified Diabetes Care and Education Specialist Please refer to [facility] for RD  and/or RD on-call/weekend/after hours pager

## 2022-05-29 NOTE — Progress Notes (Signed)
PROGRESS NOTE    Cameron Santiago  [id number] DOB: 19-Jun-1995 DOA: 05/11/2022 PCP: Pcp, No    Brief Narrative:  27 y.o. male with PMH significant for EtOH liver cirrhosis, ongoing EtOH use, Essential hypertension, presented in the ED with abdominal distention associated with abdominal pain for last few days.  Patient drinks  ETOH regularly, 1 pint of hard liquor every day.  Patient denies any nausea,  vomiting. Patient also reports subjective fever at night with chills, He denies any GI bleeding, dizziness, weakness, urinary symptoms, denies any recent travel or  URI symptoms.  03/18: ED workup: Sodium 131, potassium 2.5, anion gap 13, magnesium 1.8, alkaline phosphatase 107, albumin 2.1, lipase 75, AST 169, ALT 21, ALT 9.4, total bilirubin 28.4, total protein 9.4, WBC 14.4, hemoglobin 10.9, hematocrit 31.2, MCV 104.7, platelet 192, influenza negative, COVID-negative, RSV negative, UA: Ketones negative, LE negative, nitrate negative. CTA/P:  Hepatosplenomegaly, Hepatic cirrhosis, Distended gallbladder, Perigastric and perisplenic varices. Small ascites. Admitted for treatment and monitoring EtOH withdrawal  03/19:  GI saw patient. NPO 03/20: Advance to CLD. Pt is concerned about EtOH w/drawal, remains on CIWA protocol  03/21: liver doppler (+)cirrhosis and portal HTN, advancing to full liquids.  03/22: advancing diet. (+)cough and rales, CXR showing cardiomegaly and vascular congestion --> lasix + albumin, Echo to eval further.  03/23: no concerns on Echocardiogram w/ EF 60-65% no RWMA and no diastolic df, diuresed well yesterday will repeat Lasix today. Dr Allen Norris w/ GI checked up on previous paracentesis labs and no SBP, ok to start steroids, he will be placing these orders. Given deterioration on labs / increased MELD score, Dr Allen Norris also spoke w/ [facility] transplant team - given previous pancreatitis treatment, patient is not a transplant candidate.  03/24: hyponatremia worse to 123, reduced  spironolactone. GI following. D/c abx. Bili slightly improved on steroids. GI and hospitalist teams have discussed that he is not a transplant candidate - see GI note from today.  03/25: sodium improved to 126. Bili slightly improved as well to 25. Continuing steroids. Per GI, "advanced liver cirrhosis likely from alcohol abuse. His other labs did not show any cause for his cirrhosis. The patient should continue with supportive care. Nothing further to do from a GI point of view at this time." 03/26: bilirubin down to 24, sodium 128, WBC increased to 18 w/ neutrophils, likely d/t steroids however pt reports subjective fever/chills, no cough, abdominal pain is stable. Getting CXR given some concern for infiltrate on previous but no obvious pneumonia, low threshold for restarting ceftriaxone, he declined paracentesis but may need to revisit this. Will leave on steroids for now, low suspicion for infection. Increased lasix. 3/27.  Patient hesitant about going home.  I will uptitrate spironolactone so hopefully we can get off potassium supplementation.  Declined another paracentesis. 3/28.  Patient does not feel well today.  Did not sleep well.  Declined paracentesis. 3/29.  Patient feels worse today with nausea vomiting, abdominal pain.  Patient also has left leg pain left lateral thigh.  Patient declined paracentesis.  Patient declined MRI of his abdomen and or his thigh.  Will continue to monitor.  With temperature dropping to 95.5 will start empiric antibiotics. 3/30.  White blood cell count down slightly to 24.8.  Patient still having pain in the left leg.  Ultrasound left lower extremity negative for DVT.  Wondering if pain is secondary to high-dose steroids.  Will start to taper prednisone for tomorrow down to 30 mg daily.  CK normal  range.  Patient declined MRI abdomen or MRI left leg. 3/31.  Patient still not feeling well.  Asking about prognosis.  Left leg pain persists.  MRI of the leg showed a large  fluid collection likely hematoma 4/1.  Spoke with interventional radiology team.  They got an ultrasound and did a fluid aspiration and they think it is hematoma.  Red blood cells 11,413.  White blood cell count 2817 with 72% lymphocytes.  On empiric Rocephin.  Await Gram stain and culture. 4/2.  Gram stain still pending but culture shows no growth after 1 day.  Patient still having a lot of pain and increased oxycodone to 10 mg as needed. 4/3: Culture from aspirated fluid collection NGTD.  Pain control improving.  Still swollen 4/4: Bilirubin bumped up to 20 4/5: Bilirubin downtrending.  Now at 17.  Assessment & Plan:   Principal Problem:   Alcoholic hepatitis with ascites Active Problems:   Leukocytosis   Acute pain of left thigh   Alcoholic cirrhosis   Acute hepatic encephalopathy   Alcohol dependence with withdrawal   Elevated bilirubin   Hypokalemia   GERD (gastroesophageal reflux disease)   Obesity (BMI 30-39.9)   Generalized abdominal pain   Chronic diastolic CHF (congestive heart failure)   Hyponatremia   Hypocalcemia   Anemia of chronic disease   Alcoholic hepatitis with ascites Continue prednisone 30 mg daily.  Will continue Rocephin for now.  Repeat abdominal ultrasound today  Acute pain of left thigh MRI left leg showed a large fluid collection.  Ultrasound negative for DVT.  CK and uric acid normal.  Interventional radiology did a drainage on 4/1.Marland Kitchen.  They felt it was most likely hematoma.  Red blood cells 11,413 but white blood cells also elevated at 2817.  Lovenox discontinued on 3/31.  Heating pad.  Pain control with oral and IV medications.  Continue p.o. vitamin K x 3 days, now complete.  Attempt to wean off IV narcotic.  Continue muscle relaxant added 4/3.  Repeat nonvascular ultrasound ordered 4/5.  If fluid collection looks improved we will discontinue antibiotics.  leukocytosis Worsening leukocytosis on 3/29.  Empiric Rocephin started on 3/29.  White blood cell  count still elevated at 24.3.  MRI left leg showed large fluid collection which is likely hematoma that was drained.  Gram stain still pending.  So far cultures negative.  Leukocytosis likely driven by steroids.  No growth on cultures   Acute hepatic encephalopathy Continue lactulose for ammonia level of 70.  Spoke with patient about the goal bowel movement is 3/day.  Mental status intact   Alcoholic cirrhosis Advised patient that he must stop drinking in order to be a candidate for a liver transplant.  Calculated Childs class Pugh score of 13 which would be class C which would be a 65% mortality within 2 years.  Palliative care consultation.  [facility]  At this time patient is not a candidate for liver transplantation due to recurrent alcoholic pancreatitis.  Guarded prognosis.  No further recommendations from palliative care.  Patient declines further follow-up   Elevated bilirubin Total bilirubin dropped 18 on 4/3, increased slightly to 20 on 4/4.  Downtrending to 17 as of 4/5   Alcohol dependence with withdrawal No current signs of any withdrawal.   Hypokalemia With spironolactone at 100 mg daily.  Monitor and replace as necessary   Anemia of chronic disease Review of chronic disease plus also blood loss into left leg.  Last hemoglobin  8.2.  Continue to monitor hemoglobin.    Hypocalcemia Corrected calcium normal range.   Hyponatremia Likely secondary to alcohol and cirrhosis   Chronic diastolic CHF (congestive heart failure) Likely secondary to liver cirrhosis and anasarca.   Obesity (BMI 30-39.9) BMI 35.79   DVT prophylaxis: SCD Code Status: Full Family Communication: Mother at bedside 4/3, 4/4 Disposition Plan: Status is: Inpatient Remains inpatient appropriate because: Decompensated alcoholic cirrhosis/anasarca   Level of care: Med-Surg  Consultants:  Palliative care GI, signed off  Procedures:  Paracentesis Thigh hematoma  evacuation  Antimicrobials: Ceftriaxone   Subjective: Seen and examined.  Sitting up on edge of bed.  No acute distress  Objective: Vitals:   05/28/22 2019 05/29/22 0416 05/29/22 0805 05/29/22 0914  BP: 125/69 113/68 118/73 131/81  Pulse: 95 88 95 98  Resp: 18 20 19    Temp: 98.5 F (36.9 C) 98.9 F (37.2 C) 98.9 F (37.2 C)   TempSrc:   Oral   SpO2: 100% 98% 100%   Weight:      Height:        Intake/Output Summary (Last 24 hours) at 05/29/2022 1104 Last data filed at 05/29/2022 1000 Gross per 24 hour  Intake 720 ml  Output --  Net 720 ml   Filed Weights   05/11/22 0935 05/15/22 0512 05/16/22 0416  Weight: 122.9 kg 112.6 kg 116.4 kg    Examination:  General exam: No distress Respiratory system: Lungs clear.  Normal work of breathing.  Room air Cardiovascular system: S1-S2, RRR, no murmurs, no pedal edema Gastrointestinal system: Obese, soft, mild distention, positive bowel sounds Central nervous system: Alert and oriented. No focal neurological deficits. Extremities: Decreased power left lower extremity.  Decreased ROM.  Gait not assessed Skin: Lateral left thigh tender to touch Psychiatry: Judgement and insight appear normal. Mood & affect appropriate.     Data Reviewed: I have personally reviewed following labs and imaging studies  CBC: Recent Labs  Lab 05/24/22 0635 05/26/22 0711 05/27/22 0439 05/28/22 0753 05/29/22 0457  WBC 26.6* 24.3* 25.1* 24.0* 23.3*  NEUTROABS  --   --   --  20.2* 19.4*  HGB 8.8* 8.1* 8.2* 9.1* 8.5*  HCT 24.4* 22.9* 23.1* 26.0* 24.1*  MCV 107.5* 108.5* 108.5* 109.7* 109.5*  PLT 203 186 178 188 165   Basic Metabolic Panel: Recent Labs  Lab 05/24/22 0635 05/26/22 0711 05/27/22 0439 05/28/22 0753 05/29/22 0457  NA 126* 127* 127* 127* 129*  K 3.6 3.2* 3.6 3.3* 3.9  CL 88* 90* 92* 91* 92*  CO2 28 28 26 27 27   GLUCOSE 118* 124* 109* 126* 155*  BUN 28* 30* 27* 23* 21*  CREATININE 0.56* 0.64 0.64 0.61 0.66  CALCIUM 7.9*  7.8* 7.9* 8.3* 8.2*  MG 1.9  --   --   --   --   PHOS 3.7  --   --   --   --    GFR: Estimated Creatinine Clearance: 183.1 mL/min (by C-G formula based on SCr of 0.66 mg/dL). Liver Function Tests: Recent Labs  Lab 05/24/22 [phone] 05/26/22 0711 05/27/22 0439 05/28/22 0753 05/29/22 0457  AST 136* 195* 171* 200* 151*  ALT 52* 78* 82* 93* 83*  ALKPHOS 136* 147* 157* 177* 194*  BILITOT 21.5* 20.2* 18.8* 20.3* 17.8*  PROT 7.3 6.9 6.9 7.5 6.9  ALBUMIN 2.1* 2.0* 2.1* 2.2* 2.1*   No results for input(s): "LIPASE", "AMYLASE" in the last 168 hours. Recent Labs  Lab 05/26/22 0711  AMMONIA 38*  Coagulation Profile: No results for input(s): "INR", "PROTIME" in the last 168 hours.  Cardiac Enzymes: No results for input(s): "CKTOTAL", "CKMB", "CKMBINDEX", "TROPONINI" in the last 168 hours.  BNP (last 3 results) No results for input(s): "PROBNP" in the last 8760 hours. HbA1C: No results for input(s): "HGBA1C" in the last 72 hours. CBG: No results for input(s): "GLUCAP" in the last 168 hours. Lipid Profile: No results for input(s): "CHOL", "HDL", "LDLCALC", "TRIG", "CHOLHDL", "LDLDIRECT" in the last 72 hours. Thyroid Function Tests: No results for input(s): "TSH", "T4TOTAL", "FREET4", "T3FREE", "THYROIDAB" in the last 72 hours. Anemia Panel: No results for input(s): "VITAMINB12", "FOLATE", "FERRITIN", "TIBC", "IRON", "RETICCTPCT" in the last 72 hours. Sepsis Labs: No results for input(s): "PROCALCITON", "LATICACIDVEN" in the last 168 hours.  Recent Results (from the past 240 hour(s))  Body fluid culture w Gram Stain     Status: None   Collection Time: 05/25/22 12:40 PM   Specimen: Leg; Body Fluid  Result Value Ref Range Status   Specimen Description   Final    LEG LEFT Performed at [facility], [address] [place], [place] [address]    Special Requests   Final    Normal Performed at [facility], [address] [place], [place] [address]    Gram Stain  NO WBC SEEN NO ORGANISMS SEEN   Final   Culture   Final    NO GROWTH 3 DAYS Performed at [facility], [address], [place], [place] [address]    Report Status 05/28/2022 FINAL  Final         Radiology Studies: No results found.      Scheduled Meds:  amLODipine  5 mg Oral Daily   diclofenac Sodium  2 g Topical QID   docusate sodium  100 mg Oral BID   feeding supplement  237 mL Oral BID BM   folic acid  1 mg Oral Daily   furosemide  40 mg Oral BID WC   lactulose  15 g Oral BID   melatonin  5 mg Oral QHS   methocarbamol  500 mg Oral TID   multivitamin with minerals  1 tablet Oral Daily   predniSONE  30 mg Oral Q breakfast   Ensure Max Protein  11 oz Oral Daily   senna  1 tablet Oral BID   spironolactone  100 mg Oral Daily   thiamine  100 mg Oral Daily   Or   thiamine  100 mg Intravenous Daily   Continuous Infusions:  sodium chloride Stopped (05/26/22 0412)   sodium chloride Stopped (05/26/22 0412)   cefTRIAXone (ROCEPHIN)  IV 1 g (05/28/22 1536)     LOS: 18 days    Tresa Moore, MD [facility]   If 7PM-7AM, please contact night-coverage  05/29/2022, 11:04 AM

## 2022-05-30 LAB — CBC WITH DIFFERENTIAL/PLATELET
Abs Immature Granulocytes: 0.31 10*3/uL — ABNORMAL HIGH (ref 0.00–0.07)
Basophils Absolute: 0.1 10*3/uL (ref 0.0–0.1)
Basophils Relative: 0 %
Eosinophils Absolute: 0.4 10*3/uL (ref 0.0–0.5)
Eosinophils Relative: 2 %
HCT: 25.2 % — ABNORMAL LOW (ref 39.0–52.0)
Hemoglobin: 8.7 g/dL — ABNORMAL LOW (ref 13.0–17.0)
Immature Granulocytes: 1 %
Lymphocytes Relative: 8 %
Lymphs Abs: 1.8 10*3/uL (ref 0.7–4.0)
MCH: 37.8 pg — ABNORMAL HIGH (ref 26.0–34.0)
MCHC: 34.5 g/dL (ref 30.0–36.0)
MCV: 109.6 fL — ABNORMAL HIGH (ref 80.0–100.0)
Monocytes Absolute: 1.4 10*3/uL — ABNORMAL HIGH (ref 0.1–1.0)
Monocytes Relative: 6 %
Neutro Abs: 19 10*3/uL — ABNORMAL HIGH (ref 1.7–7.7)
Neutrophils Relative %: 83 %
Platelets: 161 10*3/uL (ref 150–400)
RBC: 2.3 MIL/uL — ABNORMAL LOW (ref 4.22–5.81)
RDW: 15.9 % — ABNORMAL HIGH (ref 11.5–15.5)
WBC: 22.9 10*3/uL — ABNORMAL HIGH (ref 4.0–10.5)
nRBC: 0 % (ref 0.0–0.2)

## 2022-05-30 LAB — COMPREHENSIVE METABOLIC PANEL
ALT: 90 U/L — ABNORMAL HIGH (ref 0–44)
AST: 177 U/L — ABNORMAL HIGH (ref 15–41)
Albumin: 2.3 g/dL — ABNORMAL LOW (ref 3.5–5.0)
Alkaline Phosphatase: 183 U/L — ABNORMAL HIGH (ref 38–126)
Anion gap: 10 (ref 5–15)
BUN: 22 mg/dL — ABNORMAL HIGH (ref 6–20)
CO2: 28 mmol/L (ref 22–32)
Calcium: 8.3 mg/dL — ABNORMAL LOW (ref 8.9–10.3)
Chloride: 90 mmol/L — ABNORMAL LOW (ref 98–111)
Creatinine, Ser: 0.58 mg/dL — ABNORMAL LOW (ref 0.61–1.24)
GFR, Estimated: >60 mL/min (ref >60)
Glucose, Bld: 151 mg/dL — ABNORMAL HIGH (ref 70–99)
Potassium: 3.3 mmol/L — ABNORMAL LOW (ref 3.5–5.1)
Sodium: 128 mmol/L — ABNORMAL LOW (ref 135–145)
Total Bilirubin: 17.9 mg/dL — ABNORMAL HIGH (ref 0.3–1.2)
Total Protein: 7.1 g/dL (ref 6.5–8.1)

## 2022-05-30 MED ORDER — POTASSIUM CHLORIDE CRYS ER 20 MEQ PO TBCR
40.0000 meq | EXTENDED_RELEASE_TABLET | Freq: Once | ORAL | Status: AC
  Administered 2022-05-30: 40 meq via ORAL
  Filled 2022-05-30: qty 2

## 2022-05-30 MED ORDER — ENSURE ENLIVE PO LIQD
1.0000 | Freq: Three times a day (TID) | ORAL | Status: DC
  Administered 2022-05-30 – 2022-06-01 (×5): 237 mL via ORAL

## 2022-05-30 NOTE — Progress Notes (Signed)
PROGRESS NOTE    Cameron Santiago  [id number] DOB: 19-Jun-1995 DOA: 05/11/2022 PCP: Pcp, No    Brief Narrative:  27 y.o. male with PMH significant for EtOH liver cirrhosis, ongoing EtOH use, Essential hypertension, presented in the ED with abdominal distention associated with abdominal pain for last few days.  Patient drinks  ETOH regularly, 1 pint of hard liquor every day.  Patient denies any nausea,  vomiting. Patient also reports subjective fever at night with chills, He denies any GI bleeding, dizziness, weakness, urinary symptoms, denies any recent travel or  URI symptoms.  03/18: ED workup: Sodium 131, potassium 2.5, anion gap 13, magnesium 1.8, alkaline phosphatase 107, albumin 2.1, lipase 75, AST 169, ALT 21, ALT 9.4, total bilirubin 28.4, total protein 9.4, WBC 14.4, hemoglobin 10.9, hematocrit 31.2, MCV 104.7, platelet 192, influenza negative, COVID-negative, RSV negative, UA: Ketones negative, LE negative, nitrate negative. CTA/P:  Hepatosplenomegaly, Hepatic cirrhosis, Distended gallbladder, Perigastric and perisplenic varices. Small ascites. Admitted for treatment and monitoring EtOH withdrawal  03/19:  GI saw patient. NPO 03/20: Advance to CLD. Pt is concerned about EtOH w/drawal, remains on CIWA protocol  03/21: liver doppler (+)cirrhosis and portal HTN, advancing to full liquids.  03/22: advancing diet. (+)cough and rales, CXR showing cardiomegaly and vascular congestion --> lasix + albumin, Echo to eval further.  03/23: no concerns on Echocardiogram w/ EF 60-65% no RWMA and no diastolic df, diuresed well yesterday will repeat Lasix today. Dr Allen Norris w/ GI checked up on previous paracentesis labs and no SBP, ok to start steroids, he will be placing these orders. Given deterioration on labs / increased MELD score, Dr Allen Norris also spoke w/ [facility] transplant team - given previous pancreatitis treatment, patient is not a transplant candidate.  03/24: hyponatremia worse to 123, reduced  spironolactone. GI following. D/c abx. Bili slightly improved on steroids. GI and hospitalist teams have discussed that he is not a transplant candidate - see GI note from today.  03/25: sodium improved to 126. Bili slightly improved as well to 25. Continuing steroids. Per GI, "advanced liver cirrhosis likely from alcohol abuse. His other labs did not show any cause for his cirrhosis. The patient should continue with supportive care. Nothing further to do from a GI point of view at this time." 03/26: bilirubin down to 24, sodium 128, WBC increased to 18 w/ neutrophils, likely d/t steroids however pt reports subjective fever/chills, no cough, abdominal pain is stable. Getting CXR given some concern for infiltrate on previous but no obvious pneumonia, low threshold for restarting ceftriaxone, he declined paracentesis but may need to revisit this. Will leave on steroids for now, low suspicion for infection. Increased lasix. 3/27.  Patient hesitant about going home.  I will uptitrate spironolactone so hopefully we can get off potassium supplementation.  Declined another paracentesis. 3/28.  Patient does not feel well today.  Did not sleep well.  Declined paracentesis. 3/29.  Patient feels worse today with nausea vomiting, abdominal pain.  Patient also has left leg pain left lateral thigh.  Patient declined paracentesis.  Patient declined MRI of his abdomen and or his thigh.  Will continue to monitor.  With temperature dropping to 95.5 will start empiric antibiotics. 3/30.  White blood cell count down slightly to 24.8.  Patient still having pain in the left leg.  Ultrasound left lower extremity negative for DVT.  Wondering if pain is secondary to high-dose steroids.  Will start to taper prednisone for tomorrow down to 30 mg daily.  CK normal  range.  Patient declined MRI abdomen or MRI left leg. 3/31.  Patient still not feeling well.  Asking about prognosis.  Left leg pain persists.  MRI of the leg showed a large  fluid collection likely hematoma 4/1.  Spoke with interventional radiology team.  They got an ultrasound and did a fluid aspiration and they think it is hematoma.  Red blood cells 11,413.  White blood cell count 2817 with 72% lymphocytes.  On empiric Rocephin.  Await Gram stain and culture. 4/2.  Gram stain still pending but culture shows no growth after 1 day.  Patient still having a lot of pain and increased oxycodone to 10 mg as needed. 4/3: Culture from aspirated fluid collection NGTD.  Pain control improving.  Still swollen 4/4: Bilirubin bumped up to 20 4/5: Bilirubin downtrending.  Now at 17. 4/6: Bilirubin stable at 17.  Abdominal ultrasound negative for ascites.  Thigh hematoma decreasing in size.   Assessment & Plan:   Principal Problem:   Alcoholic hepatitis with ascites Active Problems:   Leukocytosis   Acute pain of left thigh   Alcoholic cirrhosis   Acute hepatic encephalopathy   Alcohol dependence with withdrawal   Elevated bilirubin   Hypokalemia   GERD (gastroesophageal reflux disease)   Obesity (BMI 30-39.9)   Generalized abdominal pain   Chronic diastolic CHF (congestive heart failure)   Hyponatremia   Hypocalcemia   Anemia of chronic disease   Alcoholic hepatitis with ascites Continue prednisone 30 mg daily.  Discontinue antibiotics 4/6  Acute pain of left thigh MRI left leg showed a large fluid collection.  Ultrasound negative for DVT.  CK and uric acid normal.  Interventional radiology did a drainage on 4/1.Marland Kitchen.  They felt it was most likely hematoma.  Red blood cells 11,413 but white blood cells also elevated at 2817.  Lovenox discontinued on 3/31.  Heating pad.  Pain control with oral and IV medications.  Continue p.o. vitamin K x 3 days, now complete.  Attempt to wean off IV narcotic.  Continue muscle relaxant added 4/3.  Repeat nonvascular ultrasound demonstrates reduction in fluid collection size.  Antibiotics to be discontinued.  leukocytosis Likely driven  by steroid administration.  No indication for antibiotics.  No evidence for infectious etiology   Acute hepatic encephalopathy Continue lactulose for ammonia level of 70.  Spoke with patient about the goal bowel movement is 3/day.  Mental status intact   Alcoholic cirrhosis Advised patient that he must stop drinking in order to be a candidate for a liver transplant.  Calculated Childs class Pugh score of 13 which would be class C which would be a 65% mortality within 2 years.  Palliative care consultation.  [facility].  At this time patient is not a candidate for liver transplantation due to recurrent alcoholic pancreatitis.  Guarded prognosis.  No further recommendations from palliative care.  Patient declines further follow-up   Elevated bilirubin Total bilirubin dropped 18 on 4/3, increased slightly to 20 on 4/4.  Downtrending to 17 as of 4/5   Alcohol dependence with withdrawal No current signs of any withdrawal.   Hypokalemia With spironolactone at 100 mg daily.  Monitor and replace as necessary   Anemia of chronic disease Hemoglobin stable   Hypocalcemia Corrected calcium normal range.   Hyponatremia Likely secondary to alcohol and cirrhosis   Chronic diastolic CHF (congestive heart failure) Likely secondary to liver cirrhosis and anasarca.   Obesity (BMI 30-39.9) BMI 35.79   DVT  prophylaxis: SCD Code Status: Full Family Communication: Mother at bedside 4/3, 4/4 Disposition Plan: Status is: Inpatient Remains inpatient appropriate because: Decompensated alcoholic cirrhosis/anasarca   Level of care: Med-Surg  Consultants:  Palliative care GI, signed off  Procedures:  Paracentesis Thigh hematoma evacuation  Antimicrobials: Ceftriaxone   Subjective: Seen and examined.  Lying in bed.  No visible distress  Objective: Vitals:   05/29/22 1639 05/29/22 1931 05/30/22 0530 05/30/22 0819  BP: 132/70 132/71 (!) 140/80 (!) 140/65  Pulse: 99  88 89 88  Resp: 16 16 16 20   Temp: 98.3 F (36.8 C) 98.4 F (36.9 C) 97.6 F (36.4 C) 98.2 F (36.8 C)  TempSrc: Oral   Oral  SpO2: 99% 99% 100% 100%  Weight:      Height:       No intake or output data in the 24 hours ending 05/30/22 1040  Filed Weights   05/11/22 0935 05/15/22 0512 05/16/22 0416  Weight: 122.9 kg 112.6 kg 116.4 kg    Examination:  General exam: No acute distress Respiratory system: Lungs clear.  Normal work of breathing.  Room air Cardiovascular system: S1-S2, RRR, no murmurs, no pedal edema Gastrointestinal system: Obese, soft, mild distention, positive bowel sounds Central nervous system: Alert and oriented. No focal neurological deficits. Extremities: Decreased power left lower extremity.  Decreased ROM.  Gait not assessed Skin: Lateral left thigh tender to touch, area improving Psychiatry: Judgement and insight appear normal. Mood & affect appropriate.     Data Reviewed: I have personally reviewed following labs and imaging studies  CBC: Recent Labs  Lab 05/26/22 0711 05/27/22 0439 05/28/22 0753 05/29/22 0457 05/30/22 0411  WBC 24.3* 25.1* 24.0* 23.3* 22.9*  NEUTROABS  --   --  20.2* 19.4* 19.0*  HGB 8.1* 8.2* 9.1* 8.5* 8.7*  HCT 22.9* 23.1* 26.0* 24.1* 25.2*  MCV 108.5* 108.5* 109.7* 109.5* 109.6*  PLT 186 178 188 165 161   Basic Metabolic Panel: Recent Labs  Lab 05/24/22 0635 05/26/22 0711 05/27/22 0439 05/28/22 0753 05/29/22 0457 05/30/22 0411  NA 126* 127* 127* 127* 129* 128*  K 3.6 3.2* 3.6 3.3* 3.9 3.3*  CL 88* 90* 92* 91* 92* 90*  CO2 28 28 26 27 27 28   GLUCOSE 118* 124* 109* 126* 155* 151*  BUN 28* 30* 27* 23* 21* 22*  CREATININE 0.56* 0.64 0.64 0.61 0.66 0.58*  CALCIUM 7.9* 7.8* 7.9* 8.3* 8.2* 8.3*  MG 1.9  --   --   --   --   --   PHOS 3.7  --   --   --   --   --    GFR: Estimated Creatinine Clearance: 183.1 mL/min (A) (by C-G formula based on SCr of 0.58 mg/dL (L)). Liver Function Tests: Recent Labs  Lab  05/26/22 0711 05/27/22 0439 05/28/22 0753 05/29/22 0457 05/30/22 0411  AST 195* 171* 200* 151* 177*  ALT 78* 82* 93* 83* 90*  ALKPHOS 147* 157* 177* 194* 183*  BILITOT 20.2* 18.8* 20.3* 17.8* 17.9*  PROT 6.9 6.9 7.5 6.9 7.1  ALBUMIN 2.0* 2.1* 2.2* 2.1* 2.3*   No results for input(s): "LIPASE", "AMYLASE" in the last 168 hours. Recent Labs  Lab 05/26/22 0711  AMMONIA 38*   Coagulation Profile: No results for input(s): "INR", "PROTIME" in the last 168 hours.  Cardiac Enzymes: No results for input(s): "CKTOTAL", "CKMB", "CKMBINDEX", "TROPONINI" in the last 168 hours.  BNP (last 3 results) No results for input(s): "PROBNP" in the last 8760 hours. HbA1C: No  results for input(s): "HGBA1C" in the last 72 hours. CBG: No results for input(s): "GLUCAP" in the last 168 hours. Lipid Profile: No results for input(s): "CHOL", "HDL", "LDLCALC", "TRIG", "CHOLHDL", "LDLDIRECT" in the last 72 hours. Thyroid Function Tests: No results for input(s): "TSH", "T4TOTAL", "FREET4", "T3FREE", "THYROIDAB" in the last 72 hours. Anemia Panel: No results for input(s): "VITAMINB12", "FOLATE", "FERRITIN", "TIBC", "IRON", "RETICCTPCT" in the last 72 hours. Sepsis Labs: No results for input(s): "PROCALCITON", "LATICACIDVEN" in the last 168 hours.  Recent Results (from the past 240 hour(s))  Body fluid culture w Gram Stain     Status: None   Collection Time: 05/25/22 12:40 PM   Specimen: Leg; Body Fluid  Result Value Ref Range Status   Specimen Description   Final    LEG LEFT Performed at [facility], [address] [place], [place] [address]    Special Requests   Final    Normal Performed at [facility], [address] [place], [place] [address]    Gram Stain NO WBC SEEN NO ORGANISMS SEEN   Final   Culture   Final    NO GROWTH 3 DAYS Performed at [facility], [address], [place], [place] [address]    Report Status 05/28/2022 FINAL  Final          Radiology Studies: [place] LT LOWER EXTREM LTD SOFT TISSUE NON VASCULAR  Result Date: 05/29/2022 CLINICAL DATA:  Fluid collection.  Previous aspiration EXAM: ULTRASOUND left LOWER EXTREMITY LIMITED TECHNIQUE: Ultrasound examination of the lower extremity soft tissues was performed in the area of clinical concern. COMPARISON:  05/25/2022. FINDINGS: Left lateral thigh fluid collection now measures 4.0 x 2.2 x 1.2 cm, smaller than the prior study after previous aspiration. The appearance is nonspecific and can be seen with old blood, seroma or abscess. IMPRESSION: Left lateral thigh fluid collection Has decreased status post aspiration. Electronically Signed   By: Layla Maw M.D.   On: 05/29/2022 16:32   US Abdomen Limited  Result Date: 05/29/2022 CLINICAL DATA:  Ascites EXAM: LIMITED ABDOMEN ULTRASOUND FOR ASCITES TECHNIQUE: Limited ultrasound survey for ascites was performed in all four abdominal quadrants. COMPARISON:  05/18/2022. FINDINGS: Evaluation for ascites demonstrates minimal abdominal ascites. There was no drainable collection. IMPRESSION: Minimal ascites was seen. Electronically Signed   By: Layla Maw M.D.   On: 05/29/2022 12:23        Scheduled Meds:  amLODipine  5 mg Oral Daily   diclofenac Sodium  2 g Topical QID   docusate sodium  100 mg Oral BID   feeding supplement  237 mL Oral TID BM   folic acid  1 mg Oral Daily   furosemide  40 mg Oral BID WC   lactulose  15 g Oral BID   melatonin  5 mg Oral QHS   methocarbamol  500 mg Oral TID   multivitamin with minerals  1 tablet Oral Daily   predniSONE  30 mg Oral Q breakfast   senna  1 tablet Oral BID   spironolactone  100 mg Oral Daily   thiamine  100 mg Oral Daily   Or   thiamine  100 mg Intravenous Daily   Continuous Infusions:  sodium chloride Stopped (05/26/22 0412)   sodium chloride Stopped (05/26/22 0412)     LOS: 19 days    Tresa Moore, MD [facility]   If 7PM-7AM, please  contact night-coverage  05/30/2022, 10:40 AM

## 2022-05-31 LAB — COMPREHENSIVE METABOLIC PANEL
ALT: 97 U/L — ABNORMAL HIGH (ref 0–44)
AST: 172 U/L — ABNORMAL HIGH (ref 15–41)
Albumin: 2.2 g/dL — ABNORMAL LOW (ref 3.5–5.0)
Alkaline Phosphatase: 200 U/L — ABNORMAL HIGH (ref 38–126)
Anion gap: 9 (ref 5–15)
BUN: 18 mg/dL (ref 6–20)
CO2: 28 mmol/L (ref 22–32)
Calcium: 8.4 mg/dL — ABNORMAL LOW (ref 8.9–10.3)
Chloride: 91 mmol/L — ABNORMAL LOW (ref 98–111)
Creatinine, Ser: 0.6 mg/dL — ABNORMAL LOW (ref 0.61–1.24)
GFR, Estimated: >60 mL/min (ref >60)
Glucose, Bld: 112 mg/dL — ABNORMAL HIGH (ref 70–99)
Potassium: 3.4 mmol/L — ABNORMAL LOW (ref 3.5–5.1)
Sodium: 128 mmol/L — ABNORMAL LOW (ref 135–145)
Total Bilirubin: 18 mg/dL — ABNORMAL HIGH (ref 0.3–1.2)
Total Protein: 7.1 g/dL (ref 6.5–8.1)

## 2022-05-31 LAB — CBC WITH DIFFERENTIAL/PLATELET
Abs Immature Granulocytes: 0.27 10*3/uL — ABNORMAL HIGH (ref 0.00–0.07)
Basophils Absolute: 0.1 10*3/uL (ref 0.0–0.1)
Basophils Relative: 0 %
Eosinophils Absolute: 0.4 10*3/uL (ref 0.0–0.5)
Eosinophils Relative: 2 %
HCT: 25.8 % — ABNORMAL LOW (ref 39.0–52.0)
Hemoglobin: 9 g/dL — ABNORMAL LOW (ref 13.0–17.0)
Immature Granulocytes: 1 %
Lymphocytes Relative: 8 %
Lymphs Abs: 1.7 10*3/uL (ref 0.7–4.0)
MCH: 37.8 pg — ABNORMAL HIGH (ref 26.0–34.0)
MCHC: 34.9 g/dL (ref 30.0–36.0)
MCV: 108.4 fL — ABNORMAL HIGH (ref 80.0–100.0)
Monocytes Absolute: 1.1 10*3/uL — ABNORMAL HIGH (ref 0.1–1.0)
Monocytes Relative: 5 %
Neutro Abs: 18 10*3/uL — ABNORMAL HIGH (ref 1.7–7.7)
Neutrophils Relative %: 84 %
Platelets: 154 10*3/uL (ref 150–400)
RBC: 2.38 MIL/uL — ABNORMAL LOW (ref 4.22–5.81)
RDW: 15.8 % — ABNORMAL HIGH (ref 11.5–15.5)
WBC: 21.5 10*3/uL — ABNORMAL HIGH (ref 4.0–10.5)
nRBC: 0 % (ref 0.0–0.2)

## 2022-05-31 NOTE — Progress Notes (Signed)
PROGRESS NOTE    Cameron Santiago  [id number] DOB: 19-Jun-1995 DOA: 05/11/2022 PCP: Pcp, No    Brief Narrative:  27 y.o. male with PMH significant for EtOH liver cirrhosis, ongoing EtOH use, Essential hypertension, presented in the ED with abdominal distention associated with abdominal pain for last few days.  Patient drinks  ETOH regularly, 1 pint of hard liquor every day.  Patient denies any nausea,  vomiting. Patient also reports subjective fever at night with chills, He denies any GI bleeding, dizziness, weakness, urinary symptoms, denies any recent travel or  URI symptoms.  03/18: ED workup: Sodium 131, potassium 2.5, anion gap 13, magnesium 1.8, alkaline phosphatase 107, albumin 2.1, lipase 75, AST 169, ALT 21, ALT 9.4, total bilirubin 28.4, total protein 9.4, WBC 14.4, hemoglobin 10.9, hematocrit 31.2, MCV 104.7, platelet 192, influenza negative, COVID-negative, RSV negative, UA: Ketones negative, LE negative, nitrate negative. CTA/P:  Hepatosplenomegaly, Hepatic cirrhosis, Distended gallbladder, Perigastric and perisplenic varices. Small ascites. Admitted for treatment and monitoring EtOH withdrawal  03/19:  GI saw patient. NPO 03/20: Advance to CLD. Pt is concerned about EtOH w/drawal, remains on CIWA protocol  03/21: liver doppler (+)cirrhosis and portal HTN, advancing to full liquids.  03/22: advancing diet. (+)cough and rales, CXR showing cardiomegaly and vascular congestion --> lasix + albumin, Echo to eval further.  03/23: no concerns on Echocardiogram w/ EF 60-65% no RWMA and no diastolic df, diuresed well yesterday will repeat Lasix today. Dr Allen Norris w/ GI checked up on previous paracentesis labs and no SBP, ok to start steroids, he will be placing these orders. Given deterioration on labs / increased MELD score, Dr Allen Norris also spoke w/ [facility] transplant team - given previous pancreatitis treatment, patient is not a transplant candidate.  03/24: hyponatremia worse to 123, reduced  spironolactone. GI following. D/c abx. Bili slightly improved on steroids. GI and hospitalist teams have discussed that he is not a transplant candidate - see GI note from today.  03/25: sodium improved to 126. Bili slightly improved as well to 25. Continuing steroids. Per GI, "advanced liver cirrhosis likely from alcohol abuse. His other labs did not show any cause for his cirrhosis. The patient should continue with supportive care. Nothing further to do from a GI point of view at this time." 03/26: bilirubin down to 24, sodium 128, WBC increased to 18 w/ neutrophils, likely d/t steroids however pt reports subjective fever/chills, no cough, abdominal pain is stable. Getting CXR given some concern for infiltrate on previous but no obvious pneumonia, low threshold for restarting ceftriaxone, he declined paracentesis but may need to revisit this. Will leave on steroids for now, low suspicion for infection. Increased lasix. 3/27.  Patient hesitant about going home.  I will uptitrate spironolactone so hopefully we can get off potassium supplementation.  Declined another paracentesis. 3/28.  Patient does not feel well today.  Did not sleep well.  Declined paracentesis. 3/29.  Patient feels worse today with nausea vomiting, abdominal pain.  Patient also has left leg pain left lateral thigh.  Patient declined paracentesis.  Patient declined MRI of his abdomen and or his thigh.  Will continue to monitor.  With temperature dropping to 95.5 will start empiric antibiotics. 3/30.  White blood cell count down slightly to 24.8.  Patient still having pain in the left leg.  Ultrasound left lower extremity negative for DVT.  Wondering if pain is secondary to high-dose steroids.  Will start to taper prednisone for tomorrow down to 30 mg daily.  CK normal  range.  Patient declined MRI abdomen or MRI left leg. 3/31.  Patient still not feeling well.  Asking about prognosis.  Left leg pain persists.  MRI of the leg showed a large  fluid collection likely hematoma 4/1.  Spoke with interventional radiology team.  They got an ultrasound and did a fluid aspiration and they think it is hematoma.  Red blood cells 11,413.  White blood cell count 2817 with 72% lymphocytes.  On empiric Rocephin.  Await Gram stain and culture. 4/2.  Gram stain still pending but culture shows no growth after 1 day.  Patient still having a lot of pain and increased oxycodone to 10 mg as needed. 4/3: Culture from aspirated fluid collection NGTD.  Pain control improving.  Still swollen 4/4: Bilirubin bumped up to 20 4/5: Bilirubin downtrending.  Now at 17. 4/6: Bilirubin stable at 17.  Abdominal ultrasound negative for ascites.  Thigh hematoma decreasing in size. 4/7: No status changes   Assessment & Plan:   Principal Problem:   Alcoholic hepatitis with ascites Active Problems:   Leukocytosis   Acute pain of left thigh   Alcoholic cirrhosis   Acute hepatic encephalopathy   Alcohol dependence with withdrawal   Elevated bilirubin   Hypokalemia   GERD (gastroesophageal reflux disease)   Obesity (BMI 30-39.9)   Generalized abdominal pain   Chronic diastolic CHF (congestive heart failure)   Hyponatremia   Hypocalcemia   Anemia of chronic disease   Alcoholic hepatitis with ascites Continue prednisone 30 mg daily.  Discontinue antibiotics 4/6  Acute pain of left thigh MRI left leg showed a large fluid collection.  Ultrasound negative for DVT.  CK and uric acid normal.  Interventional radiology did a drainage on 4/1.Marland Kitchen  They felt it was most likely hematoma.  Red blood cells 11,413 but white blood cells also elevated at 2817.  Lovenox discontinued on 3/31.  Heating pad.  Pain control with oral and IV medications.  Continue p.o. vitamin K x 3 days, now complete.  Attempt to wean off IV narcotic.  Continue muscle relaxant added 4/3.  Repeat nonvascular ultrasound demonstrates reduction in fluid collection size.  Antibiotic stopped.  Will engage  physical therapy and Occupational Therapy 4/7  leukocytosis Likely driven by steroid administration.  No indication for antibiotics.  No evidence for infectious etiology   Acute hepatic encephalopathy Continue lactulose for ammonia level of 70.  Spoke with patient about the goal bowel movement is 3/day.  Mental status intact   Alcoholic cirrhosis Advised patient that he must stop drinking in order to be a candidate for a liver transplant.  Calculated Childs class Pugh score of 13 which would be class C which would be a 65% mortality within 2 years.  Palliative care consultation.  GI spoke to transplant team at [facility].  At this time patient is not a candidate for liver transplantation due to recurrent alcoholic pancreatitis.  Guarded prognosis.  No further recommendations from palliative care.  Patient declines further palliative follow-up   Elevated bilirubin Total bilirubin dropped 18 on 4/3, increased slightly to 20 on 4/4.  Downtrending to 17 as of 4/5.  Appears consistent at this level.  Likely will take several weeks to trend down.   Alcohol dependence with withdrawal No current signs of any withdrawal.   Hypokalemia With spironolactone at 100 mg daily.  Monitor and replace as necessary   Anemia of chronic disease Hemoglobin stable   Hypocalcemia Corrected calcium normal range.   Hyponatremia Likely secondary to alcohol  and cirrhosis   Chronic diastolic CHF (congestive heart failure) Likely secondary to liver cirrhosis and anasarca.   Obesity (BMI 30-39.9) BMI 35.79   DVT prophylaxis: SCD Code Status: Full Family Communication: Mother at bedside 4/3, 4/4 Disposition Plan: Status is: Inpatient Remains inpatient appropriate because: Decompensated alcoholic cirrhosis/anasarca   Level of care: Med-Surg  Consultants:  Palliative care GI, signed off  Procedures:  Paracentesis Thigh hematoma evacuation  Antimicrobials: Ceftriaxone   Subjective: Seen and examined.   Lying in bed.  No visible distress  Objective: Vitals:   05/30/22 1704 05/30/22 1955 05/31/22 0458 05/31/22 0817  BP: 135/72 (!) 143/71 (!) 152/72 131/71  Pulse: 95 89 95 88  Resp: 18 20 18 16   Temp: 99.1 F (37.3 C) 98.7 F (37.1 C) 98.8 F (37.1 C) 98.9 F (37.2 C)  TempSrc:      SpO2: 100% 100% 99% 100%  Weight:      Height:       No intake or output data in the 24 hours ending 05/31/22 1002  Filed Weights   05/11/22 0935 05/15/22 0512 05/16/22 0416  Weight: 122.9 kg 112.6 kg 116.4 kg    Examination:  General exam: NAD.  Respiratory system: Lungs clear.  Normal work of breathing.  Room air Cardiovascular system: S1-S2, RRR, no murmurs, no pedal edema Gastrointestinal system: Obese, soft, mild distention, positive bowel sounds Central nervous system: Alert and oriented. No focal neurological deficits. Extremities: Decreased power left lower extremity.  Decreased ROM.  Gait not assessed Skin: Lateral left thigh tender to touch, area improving.  Diffusely jaundiced.  Sclera icteric. Psychiatry: Judgement and insight appear normal. Mood & affect appropriate.     Data Reviewed: I have personally reviewed following labs and imaging studies  CBC: Recent Labs  Lab 05/27/22 0439 05/28/22 0753 05/29/22 0457 05/30/22 0411 05/31/22 0553  WBC 25.1* 24.0* 23.3* 22.9* 21.5*  NEUTROABS  --  20.2* 19.4* 19.0* 18.0*  HGB 8.2* 9.1* 8.5* 8.7* 9.0*  HCT 23.1* 26.0* 24.1* 25.2* 25.8*  MCV 108.5* 109.7* 109.5* 109.6* 108.4*  PLT 178 188 165 161 154   Basic Metabolic Panel: Recent Labs  Lab 05/27/22 0439 05/28/22 0753 05/29/22 0457 05/30/22 0411 05/31/22 0553  NA 127* 127* 129* 128* 128*  K 3.6 3.3* 3.9 3.3* 3.4*  CL 92* 91* 92* 90* 91*  CO2 26 27 27 28 28   GLUCOSE 109* 126* 155* 151* 112*  BUN 27* 23* 21* 22* 18  CREATININE 0.64 0.61 0.66 0.58* 0.60*  CALCIUM 7.9* 8.3* 8.2* 8.3* 8.4*   GFR: Estimated Creatinine Clearance: 183.1 mL/min (A) (by C-G formula based on  SCr of 0.6 mg/dL (L)). Liver Function Tests: Recent Labs  Lab 05/27/22 0439 05/28/22 0753 05/29/22 0457 05/30/22 0411 05/31/22 0553  AST 171* 200* 151* 177* 172*  ALT 82* 93* 83* 90* 97*  ALKPHOS 157* 177* 194* 183* 200*  BILITOT 18.8* 20.3* 17.8* 17.9* 18.0*  PROT 6.9 7.5 6.9 7.1 7.1  ALBUMIN 2.1* 2.2* 2.1* 2.3* 2.2*   No results for input(s): "LIPASE", "AMYLASE" in the last 168 hours. Recent Labs  Lab 05/26/22 0711  AMMONIA 38*   Coagulation Profile: No results for input(s): "INR", "PROTIME" in the last 168 hours.  Cardiac Enzymes: No results for input(s): "CKTOTAL", "CKMB", "CKMBINDEX", "TROPONINI" in the last 168 hours.  BNP (last 3 results) No results for input(s): "PROBNP" in the last 8760 hours. HbA1C: No results for input(s): "HGBA1C" in the last 72 hours. CBG: No results for input(s): "GLUCAP" in  the last 168 hours. Lipid Profile: No results for input(s): "CHOL", "HDL", "LDLCALC", "TRIG", "CHOLHDL", "LDLDIRECT" in the last 72 hours. Thyroid Function Tests: No results for input(s): "TSH", "T4TOTAL", "FREET4", "T3FREE", "THYROIDAB" in the last 72 hours. Anemia Panel: No results for input(s): "VITAMINB12", "FOLATE", "FERRITIN", "TIBC", "IRON", "RETICCTPCT" in the last 72 hours. Sepsis Labs: No results for input(s): "PROCALCITON", "LATICACIDVEN" in the last 168 hours.  Recent Results (from the past 240 hour(s))  Body fluid culture w Gram Stain     Status: None   Collection Time: 05/25/22 12:40 PM   Specimen: Leg; Body Fluid  Result Value Ref Range Status   Specimen Description   Final    LEG LEFT Performed at [facility], [address] [place], [place] [address]    Special Requests   Final    Normal Performed at [facility], [address] [place], [place] [address]    Gram Stain NO WBC SEEN NO ORGANISMS SEEN   Final   Culture   Final    NO GROWTH 3 DAYS Performed at [facility], [address] [place], [place] [address]     Report Status 05/28/2022 FINAL  Final         Radiology Studies: US LT LOWER EXTREM LTD SOFT TISSUE NON VASCULAR  Result Date: 05/29/2022 CLINICAL DATA:  Fluid collection.  Previous aspiration EXAM: ULTRASOUND left LOWER EXTREMITY LIMITED TECHNIQUE: Ultrasound examination of the lower extremity soft tissues was performed in the area of clinical concern. COMPARISON:  05/25/2022. FINDINGS: Left lateral thigh fluid collection now measures 4.0 x 2.2 x 1.2 cm, smaller than the prior study after previous aspiration. The appearance is nonspecific and can be seen with old blood, seroma or abscess. IMPRESSION: Left lateral thigh fluid collection Has decreased status post aspiration. Electronically Signed   By: Layla MawJoshua  Pleasure M.D.   On: 05/29/2022 16:32   US Abdomen Limited  Result Date: 05/29/2022 CLINICAL DATA:  Ascites EXAM: LIMITED ABDOMEN ULTRASOUND FOR ASCITES TECHNIQUE: Limited ultrasound survey for ascites was performed in all four abdominal quadrants. COMPARISON:  05/18/2022. FINDINGS: Evaluation for ascites demonstrates minimal abdominal ascites. There was no drainable collection. IMPRESSION: Minimal ascites was seen. Electronically Signed   By: Layla MawJoshua  Pleasure M.D.   On: 05/29/2022 12:23        Scheduled Meds:  amLODipine  5 mg Oral Daily   diclofenac Sodium  2 g Topical QID   docusate sodium  100 mg Oral BID   feeding supplement  1 Bottle Oral TID AC & HS   folic acid  1 mg Oral Daily   furosemide  40 mg Oral BID WC   lactulose  15 g Oral BID   melatonin  5 mg Oral QHS   methocarbamol  500 mg Oral TID   multivitamin with minerals  1 tablet Oral Daily   predniSONE  30 mg Oral Q breakfast   senna  1 tablet Oral BID   spironolactone  100 mg Oral Daily   thiamine  100 mg Oral Daily   Or   thiamine  100 mg Intravenous Daily   Continuous Infusions:  sodium chloride Stopped (05/26/22 0412)   sodium chloride Stopped (05/26/22 0412)     LOS: 20 days    Tresa MooreSudheer B Janesia Joswick,  MD [facility]   If 7PM-7AM, please contact night-coverage  05/31/2022, 10:02 AM

## 2022-05-31 NOTE — Evaluation (Signed)
Physical Therapy Evaluation Patient Details Name: Cameron Santiago MRN: [id number] DOB: 07-03-1995 Today's Date: 05/31/2022  History of Present Illness  27 y.o. male with PMH significant for EtOH liver cirrhosis, ongoing EtOH use (~pint QD), Essential hypertension, presented in the ED with abdominal distention associated with abdominal pain.  Has since had increased fluid/swelling in L LE limiting mobility  Clinical Impression  Pt reports he has been able to get around in the room with walker but is initially hesitant to try a more prolonged about of WBing/ambulation in the hallway.  He was hesitant with all L LE movements but did manage to do all mobility w/o direct assist from the PT.  He was hypersensitive to any attempts (passive or active) to do some L knee flexion but never-the-less showed good effort t/o the session.  He was able to ambulate around the nurses' station (heavy UE/walker reliance during L stance phase) but clearly becoming more uncomfortable with throbbing pain with increased upright time and limping more heavily the final 50 ft or so.  Pt pleasant and motivated but far from his baseline due to significant LLE swelling and pain.      Recommendations for follow up therapy are one component of a multi-disciplinary discharge planning process, led by the attending physician.  Recommendations may be updated based on patient status, additional functional criteria and insurance authorization.  Follow Up Recommendations       Assistance Recommended at Discharge PRN  Patient can return home with the following  A little help with walking and/or transfers    Equipment Recommendations Rolling walker (2 wheels) (per progress may not need, definite need at time of eval)  Recommendations for Other Services       Functional Status Assessment Patient has had a recent decline in their functional status and demonstrates the ability to make significant improvements in function in a  reasonable and predictable amount of time.     Precautions / Restrictions Precautions Precautions: Fall (mod) Restrictions Weight Bearing Restrictions: No      Mobility  Bed Mobility Overal bed mobility: Modified Independent             General bed mobility comments: some self UE assist with L LE but able to get to/from supine/sit w/o assist    Transfers Overall transfer level: Modified independent Equipment used: Rolling walker (2 wheels)               General transfer comment: Pt needing to lean heavily on R LE and UEs to rise as L LE could not bend/get under him to help.    Ambulation/Gait Ambulation/Gait assistance: Modified independent (Device/Increase time) Gait Distance (Feet): 200 Feet Assistive device: Rolling walker (2 wheels)         General Gait Details: Pt with slow gait; heavy UE reliance on walker during L LE weight acceptance.  He was able to safely circumambulate the nurses' station but did have a few small near buckling episodes on the L that he self arrested with the walker.  Increased L LE pain and throbbing with increased distance.  Stairs            Wheelchair Mobility    Modified Rankin (Stroke Patients Only)       Balance Overall balance assessment: Modified Independent  Pertinent Vitals/Pain Pain Assessment Pain Assessment: 0-10 Pain Score: 7  Pain Location: entire L LE swollen    Home Living Family/patient expects to be discharged to:: Private residence Living Arrangements:  ("in-laws") Available Help at Discharge: Family;Available 24 hours/day   Home Access: Level entry         Home Equipment: None      Prior Function Prior Level of Function : Working/employed;Independent/Modified Independent             Mobility Comments: Pt on his feet all day at his job ADLs Comments: independent     Hand Dominance        Extremity/Trunk Assessment    Upper Extremity Assessment Upper Extremity Assessment: Overall WFL for tasks assessed    Lower Extremity Assessment Lower Extremity Assessment: Generalized weakness;LLE deficits/detail LLE Deficits / Details: Able to SLR with much effort, generally weak due to pain hesitancy. Swollen with limited available flexion, <10* PROM 2/2 pain.       Communication   Communication: No difficulties  Cognition Arousal/Alertness: Awake/alert Behavior During Therapy: WFL for tasks assessed/performed Overall Cognitive Status: Within Functional Limits for tasks assessed                                          General Comments General comments (skin integrity, edema, etc.): Pt moves relatively well apart from severely limited L LE tolerance/ROM    Exercises Other Exercises Other Exercises: Instructed on L ankle pumps, quad sets and gentle knee ROM as well as positioning tips   Assessment/Plan    PT Assessment Patient needs continued PT services  PT Problem List Decreased strength;Decreased range of motion;Decreased activity tolerance;Decreased balance;Decreased knowledge of use of DME;Decreased safety awareness;Pain       PT Treatment Interventions DME instruction;Gait training;Patient/family education;Therapeutic activities;Therapeutic exercise;Balance training;Functional mobility training    PT Goals (Current goals can be found in the Care Plan section)  Acute Rehab PT Goals Patient Stated Goal: get L leg feeling better and go home PT Goal Formulation: With patient Time For Goal Achievement: 06/13/22 Potential to Achieve Goals: Good    Frequency Min 2X/week     Co-evaluation               AM-PAC PT "6 Clicks" Mobility  Outcome Measure Help needed turning from your back to your side while in a flat bed without using bedrails?: None Help needed moving from lying on your back to sitting on the side of a flat bed without using bedrails?: None Help needed  moving to and from a bed to a chair (including a wheelchair)?: None Help needed standing up from a chair using your arms (e.g., wheelchair or bedside chair)?: None Help needed to walk in hospital room?: None Help needed climbing 3-5 steps with a railing? : None 6 Click Score: 24    End of Session Equipment Utilized During Treatment: Gait belt Activity Tolerance: Patient limited by pain;Patient tolerated treatment well Patient left: in bed;with call bell/phone within reach Nurse Communication: Mobility status PT Visit Diagnosis: Unsteadiness on feet (R26.81);Pain;Other abnormalities of gait and mobility (R26.89);Muscle weakness (generalized) (M62.81) Pain - Right/Left: Left Pain - part of body: Leg    Time: 0034-9179 PT Time Calculation (min) (ACUTE ONLY): 24 min   Charges:   PT Evaluation $PT Eval Low Complexity: 1 Low PT Treatments $Gait Training: 8-22 mins  Malachi ProGalen R Linsie Lupo, DPT 05/31/2022, 1:23 PM

## 2022-06-01 LAB — COMPREHENSIVE METABOLIC PANEL
ALT: 88 U/L — ABNORMAL HIGH (ref 0–44)
AST: 142 U/L — ABNORMAL HIGH (ref 15–41)
Albumin: 2.3 g/dL — ABNORMAL LOW (ref 3.5–5.0)
Alkaline Phosphatase: 206 U/L — ABNORMAL HIGH (ref 38–126)
Anion gap: 8 (ref 5–15)
BUN: 18 mg/dL (ref 6–20)
CO2: 29 mmol/L (ref 22–32)
Calcium: 8.3 mg/dL — ABNORMAL LOW (ref 8.9–10.3)
Chloride: 90 mmol/L — ABNORMAL LOW (ref 98–111)
Creatinine, Ser: 0.55 mg/dL — ABNORMAL LOW (ref 0.61–1.24)
GFR, Estimated: >60 mL/min (ref >60)
Glucose, Bld: 159 mg/dL — ABNORMAL HIGH (ref 70–99)
Potassium: 3.3 mmol/L — ABNORMAL LOW (ref 3.5–5.1)
Sodium: 127 mmol/L — ABNORMAL LOW (ref 135–145)
Total Bilirubin: 16.6 mg/dL — ABNORMAL HIGH (ref 0.3–1.2)
Total Protein: 7.2 g/dL (ref 6.5–8.1)

## 2022-06-01 NOTE — Evaluation (Signed)
Occupational Therapy Evaluation Patient Details Name: Cameron Santiago MRN: [id number] DOB: 21-May-1995 Today's Date: 06/01/2022   History of Present Illness 27 y.o. male with PMH significant for EtOH liver cirrhosis, ongoing EtOH use (~pint QD), Essential hypertension, presented in the ED with abdominal distention associated with abdominal pain.  Has since had increased fluid/swelling in L LE limiting mobility   Clinical Impression   Upon entering the room, pt supine in bed and agreeable to OT intervention. RN present in room giving pt medication prior to session. Pt reports being Ind at baseline and living with family. He works during the day and is active at baseline. Pt performing bed mobility, functional mobility, and self care needs at mod I level with use of RW secondary to increased pain in L LE. However, pt does not need physical assistance during session and does not need skilled OT intervention at this time. OT to sign off and pt agrees.      Recommendations for follow up therapy are one component of a multi-disciplinary discharge planning process, led by the attending physician.  Recommendations may be updated based on patient status, additional functional criteria and insurance authorization.   Assistance Recommended at Discharge PRN  Patient can return home with the following A little help with walking and/or transfers;A little help with bathing/dressing/bathroom    Functional Status Assessment  Patient has had a recent decline in their functional status and demonstrates the ability to make significant improvements in function in a reasonable and predictable amount of time.  Equipment Recommendations  None recommended by OT       Precautions / Restrictions Precautions Precautions: Fall      Mobility Bed Mobility Overal bed mobility: Modified Independent                  Transfers Overall transfer level: Modified independent Equipment used: Rolling walker (2  wheels)                      Balance Overall balance assessment: Modified Independent                                         ADL either performed or assessed with clinical judgement   ADL Overall ADL's : Modified independent                                             Vision Patient Visual Report: No change from baseline              Pertinent Vitals/Pain Pain Assessment Pain Assessment: Faces Faces Pain Scale: Hurts little more Pain Location: L LE Pain Descriptors / Indicators: Discomfort, Aching Pain Intervention(s): Monitored during session, Repositioned, Premedicated before session     Hand Dominance Right   Extremity/Trunk Assessment Upper Extremity Assessment Upper Extremity Assessment: Overall WFL for tasks assessed   Lower Extremity Assessment LLE Deficits / Details: Able to SLR with much effort, generally weak due to pain hesitancy. Swollen with limited available flexion, <10* PROM 2/2 pain.       Communication Communication Communication: No difficulties   Cognition Arousal/Alertness: Awake/alert Behavior During Therapy: WFL for tasks assessed/performed Overall Cognitive Status: Within Functional Limits for tasks assessed  Home Living Family/patient expects to be discharged to:: Private residence Living Arrangements: Other relatives Available Help at Discharge: Family;Available 24 hours/day Type of Home: House Home Access: Level entry           Bathroom Shower/Tub: Tub/shower unit         Home Equipment: None          Prior Functioning/Environment Prior Level of Function : Working/employed;Independent/Modified Independent               ADLs Comments: independent                 OT Goals(Current goals can be found in the care plan section) Acute Rehab OT Goals Patient Stated Goal: to go home and decrease  swelling/pain in L LE OT Goal Formulation: With patient Time For Goal Achievement: 06/01/22 Potential to Achieve Goals: Fair  OT Frequency:         AM-PAC OT "6 Clicks" Daily Activity     Outcome Measure Help from another person eating meals?: None Help from another person taking care of personal grooming?: None Help from another person toileting, which includes using toliet, bedpan, or urinal?: None Help from another person bathing (including washing, rinsing, drying)?: None Help from another person to put on and taking off regular upper body clothing?: None Help from another person to put on and taking off regular lower body clothing?: None 6 Click Score: 24   End of Session Equipment Utilized During Treatment: Rolling walker (2 wheels) Nurse Communication: Mobility status  Activity Tolerance: Patient tolerated treatment well Patient left: in bed  OT Visit Diagnosis: Repeated falls (R29.6);Muscle weakness (generalized) (M62.81)                Time: 3009-2330 OT Time Calculation (min): 13 min Charges:  OT General Charges $OT Visit: 1 Visit OT Evaluation $OT Eval Low Complexity: 1 Low  Jackquline Denmark, MS, OTR/L , CBIS ascom [phone]  06/01/22, 3:42 PM

## 2022-06-01 NOTE — Progress Notes (Signed)
PROGRESS NOTE    Cameron Santiago  [id number] DOB: 19-Jun-1995 DOA: 05/11/2022 PCP: Pcp, No    Brief Narrative:  27 y.o. male with PMH significant for EtOH liver cirrhosis, ongoing EtOH use, Essential hypertension, presented in the ED with abdominal distention associated with abdominal pain for last few days.  Patient drinks  ETOH regularly, 1 pint of hard liquor every day.  Patient denies any nausea,  vomiting. Patient also reports subjective fever at night with chills, He denies any GI bleeding, dizziness, weakness, urinary symptoms, denies any recent travel or  URI symptoms.  03/18: ED workup: Sodium 131, potassium 2.5, anion gap 13, magnesium 1.8, alkaline phosphatase 107, albumin 2.1, lipase 75, AST 169, ALT 21, ALT 9.4, total bilirubin 28.4, total protein 9.4, WBC 14.4, hemoglobin 10.9, hematocrit 31.2, MCV 104.7, platelet 192, influenza negative, COVID-negative, RSV negative, UA: Ketones negative, LE negative, nitrate negative. CTA/P:  Hepatosplenomegaly, Hepatic cirrhosis, Distended gallbladder, Perigastric and perisplenic varices. Small ascites. Admitted for treatment and monitoring EtOH withdrawal  03/19:  GI saw patient. NPO 03/20: Advance to CLD. Pt is concerned about EtOH w/drawal, remains on CIWA protocol  03/21: liver doppler (+)cirrhosis and portal HTN, advancing to full liquids.  03/22: advancing diet. (+)cough and rales, CXR showing cardiomegaly and vascular congestion --> lasix + albumin, Echo to eval further.  03/23: no concerns on Echocardiogram w/ EF 60-65% no RWMA and no diastolic df, diuresed well yesterday will repeat Lasix today. Dr Allen Norris w/ GI checked up on previous paracentesis labs and no SBP, ok to start steroids, he will be placing these orders. Given deterioration on labs / increased MELD score, Dr Allen Norris also spoke w/ [facility] transplant team - given previous pancreatitis treatment, patient is not a transplant candidate.  03/24: hyponatremia worse to 123, reduced  spironolactone. GI following. D/c abx. Bili slightly improved on steroids. GI and hospitalist teams have discussed that he is not a transplant candidate - see GI note from today.  03/25: sodium improved to 126. Bili slightly improved as well to 25. Continuing steroids. Per GI, "advanced liver cirrhosis likely from alcohol abuse. His other labs did not show any cause for his cirrhosis. The patient should continue with supportive care. Nothing further to do from a GI point of view at this time." 03/26: bilirubin down to 24, sodium 128, WBC increased to 18 w/ neutrophils, likely d/t steroids however pt reports subjective fever/chills, no cough, abdominal pain is stable. Getting CXR given some concern for infiltrate on previous but no obvious pneumonia, low threshold for restarting ceftriaxone, he declined paracentesis but may need to revisit this. Will leave on steroids for now, low suspicion for infection. Increased lasix. 3/27.  Patient hesitant about going home.  I will uptitrate spironolactone so hopefully we can get off potassium supplementation.  Declined another paracentesis. 3/28.  Patient does not feel well today.  Did not sleep well.  Declined paracentesis. 3/29.  Patient feels worse today with nausea vomiting, abdominal pain.  Patient also has left leg pain left lateral thigh.  Patient declined paracentesis.  Patient declined MRI of his abdomen and or his thigh.  Will continue to monitor.  With temperature dropping to 95.5 will start empiric antibiotics. 3/30.  White blood cell count down slightly to 24.8.  Patient still having pain in the left leg.  Ultrasound left lower extremity negative for DVT.  Wondering if pain is secondary to high-dose steroids.  Will start to taper prednisone for tomorrow down to 30 mg daily.  CK normal  range.  Patient declined MRI abdomen or MRI left leg. 3/31.  Patient still not feeling well.  Asking about prognosis.  Left leg pain persists.  MRI of the leg showed a large  fluid collection likely hematoma 4/1.  Spoke with interventional radiology team.  They got an ultrasound and did a fluid aspiration and they think it is hematoma.  Red blood cells 11,413.  White blood cell count 2817 with 72% lymphocytes.  On empiric Rocephin.  Await Gram stain and culture. 4/2.  Gram stain still pending but culture shows no growth after 1 day.  Patient still having a lot of pain and increased oxycodone to 10 mg as needed. 4/3: Culture from aspirated fluid collection NGTD.  Pain control improving.  Still swollen 4/4: Bilirubin bumped up to 20 4/5: Bilirubin downtrending.  Now at 17. 4/6: Bilirubin stable at 17.  Abdominal ultrasound negative for ascites.  Thigh hematoma decreasing in size. 4/7: No status changes 4/8: Bili downtrending   Assessment & Plan:   Principal Problem:   Alcoholic hepatitis with ascites Active Problems:   Leukocytosis   Acute pain of left thigh   Alcoholic cirrhosis   Acute hepatic encephalopathy   Alcohol dependence with withdrawal   Elevated bilirubin   Hypokalemia   GERD (gastroesophageal reflux disease)   Obesity (BMI 30-39.9)   Generalized abdominal pain   Chronic diastolic CHF (congestive heart failure)   Hyponatremia   Hypocalcemia   Anemia of chronic disease   Alcoholic hepatitis with ascites Continue prednisone 30 mg daily.  Discontinued antibiotics 4/6.  Will discharge on 30mg  prednisone qD  Acute pain of left thigh MRI left leg showed a large fluid collection.  Ultrasound negative for DVT.  CK and uric acid normal.  Interventional radiology did a drainage on 4/1.Marland Kitchen  They felt it was most likely hematoma.  Red blood cells 11,413 but white blood cells also elevated at 2817.  Lovenox discontinued on 3/31.  Heating pad.  Pain control with oral and IV medications.  Continue p.o. vitamin K x 3 days, now complete.  DC IV narcotic 4/8.  Continue muscle relaxant added 4/3.  Repeat nonvascular ultrasound demonstrates reduction in fluid  collection size.  Antibiotic stopped.  PT/OT engaged.  Anticipate dc 4/9  leukocytosis Likely driven by steroid administration.  No indication for antibiotics.  No evidence for infectious etiology   Acute hepatic encephalopathy Continue lactulose for ammonia level of 70.  Spoke with patient about the goal bowel movement is 3/day.  Mental status intact   Alcoholic cirrhosis Advised patient that he must stop drinking in order to be a candidate for a liver transplant.  Calculated Childs class Pugh score of 13 which would be class C which would be a 65% mortality within 2 years.  Palliative care consultation.  GI spoke to transplant team at [facility].  At this time patient is not a candidate for liver transplantation due to recurrent alcoholic pancreatitis.  Guarded prognosis.  No further recommendations from palliative care.  Patient declines further palliative follow-up   Elevated bilirubin Total bilirubin dropped 18 on 4/3, increased slightly to 20 on 4/4.  Downtrending to 17 as of 4/5.  Appears consistent at this level.  Likely will take several weeks to trend down.   Alcohol dependence with withdrawal No current signs of any withdrawal.   Hypokalemia With spironolactone at 100 mg daily.  Monitor and replace as necessary   Anemia of chronic disease Hemoglobin stable   Hypocalcemia Corrected calcium normal range.  Hyponatremia Likely secondary to alcohol and cirrhosis   Chronic diastolic CHF (congestive heart failure) Likely secondary to liver cirrhosis and anasarca.   Obesity (BMI 30-39.9) BMI 35.79   DVT prophylaxis: SCD Code Status: Full Family Communication: Mother at bedside 4/3, 4/4 Disposition Plan: Status is: Inpatient Remains inpatient appropriate because: Decompensated alcoholic cirrhosis/anasarca   Level of care: Med-Surg  Consultants:  Palliative care GI, signed off  Procedures:  Paracentesis Thigh hematoma  evacuation  Antimicrobials: Ceftriaxone   Subjective: Seen and examined.  Lying in bed.  No visible distress.  Swelling decreased  Objective: Vitals:   05/31/22 1613 05/31/22 2125 06/01/22 0443 06/01/22 0759  BP: (!) 154/79 (!) 147/63 133/70 (!) 111/58  Pulse: 94 99 95 88  Resp: 17 18 18 20   Temp: 98.2 F (36.8 C) 98.2 F (36.8 C) 97.8 F (36.6 C) 98.3 F (36.8 C)  TempSrc:  Oral Oral   SpO2: 100% 98% 97% 100%  Weight:      Height:       No intake or output data in the 24 hours ending 06/01/22 1123  Filed Weights   05/11/22 0935 05/15/22 0512 05/16/22 0416  Weight: 122.9 kg 112.6 kg 116.4 kg    Examination:  General exam: No acute distress Respiratory system: Lungs clear.  Normal work of breathing.  Room air Cardiovascular system: S1-S2, RRR, no murmurs, no pedal edema Gastrointestinal system: Obese, soft, mild distention, positive bowel sounds Central nervous system: Alert and oriented x 3. No focal neurological deficits. Extremities: Decreased power left lower extremity.  Decreased ROM.  Gait not assessed Skin: Lateral left thigh tender to touch, area improving.  Diffusely jaundiced.  Sclera icteric. Psychiatry: Judgement and insight appear normal. Mood & affect appropriate.     Data Reviewed: I have personally reviewed following labs and imaging studies  CBC: Recent Labs  Lab 05/27/22 0439 05/28/22 0753 05/29/22 0457 05/30/22 0411 05/31/22 0553  WBC 25.1* 24.0* 23.3* 22.9* 21.5*  NEUTROABS  --  20.2* 19.4* 19.0* 18.0*  HGB 8.2* 9.1* 8.5* 8.7* 9.0*  HCT 23.1* 26.0* 24.1* 25.2* 25.8*  MCV 108.5* 109.7* 109.5* 109.6* 108.4*  PLT 178 188 165 161 154   Basic Metabolic Panel: Recent Labs  Lab 05/28/22 0753 05/29/22 0457 05/30/22 0411 05/31/22 0553 06/01/22 0517  NA 127* 129* 128* 128* 127*  K 3.3* 3.9 3.3* 3.4* 3.3*  CL 91* 92* 90* 91* 90*  CO2 27 27 28 28 29   GLUCOSE 126* 155* 151* 112* 159*  BUN 23* 21* 22* 18 18  CREATININE 0.61 0.66 0.58*  0.60* 0.55*  CALCIUM 8.3* 8.2* 8.3* 8.4* 8.3*   GFR: Estimated Creatinine Clearance: 183.1 mL/min (A) (by C-G formula based on SCr of 0.55 mg/dL (L)). Liver Function Tests: Recent Labs  Lab 05/28/22 0753 05/29/22 0457 05/30/22 0411 05/31/22 0553 06/01/22 0517  AST 200* 151* 177* 172* 142*  ALT 93* 83* 90* 97* 88*  ALKPHOS 177* 194* 183* 200* 206*  BILITOT 20.3* 17.8* 17.9* 18.0* 16.6*  PROT 7.5 6.9 7.1 7.1 7.2  ALBUMIN 2.2* 2.1* 2.3* 2.2* 2.3*   No results for input(s): "LIPASE", "AMYLASE" in the last 168 hours. Recent Labs  Lab 05/26/22 0711  AMMONIA 38*   Coagulation Profile: No results for input(s): "INR", "PROTIME" in the last 168 hours.  Cardiac Enzymes: No results for input(s): "CKTOTAL", "CKMB", "CKMBINDEX", "TROPONINI" in the last 168 hours.  BNP (last 3 results) No results for input(s): "PROBNP" in the last 8760 hours. HbA1C: No results for input(s): "HGBA1C"  in the last 72 hours. CBG: No results for input(s): "GLUCAP" in the last 168 hours. Lipid Profile: No results for input(s): "CHOL", "HDL", "LDLCALC", "TRIG", "CHOLHDL", "LDLDIRECT" in the last 72 hours. Thyroid Function Tests: No results for input(s): "TSH", "T4TOTAL", "FREET4", "T3FREE", "THYROIDAB" in the last 72 hours. Anemia Panel: No results for input(s): "VITAMINB12", "FOLATE", "FERRITIN", "TIBC", "IRON", "RETICCTPCT" in the last 72 hours. Sepsis Labs: No results for input(s): "PROCALCITON", "LATICACIDVEN" in the last 168 hours.  Recent Results (from the past 240 hour(s))  Body fluid culture w Gram Stain     Status: None   Collection Time: 05/25/22 12:40 PM   Specimen: Leg; Body Fluid  Result Value Ref Range Status   Specimen Description   Final    LEG LEFT Performed at [facility], [address] [place], [place] [address]    Special Requests   Final    Normal Performed at [facility], [address] [place], [place] [address]    Gram Stain NO WBC SEEN NO ORGANISMS  SEEN   Final   Culture   Final    NO GROWTH 3 DAYS Performed at [facility], [address] [place], [place] [address]    Report Status 05/28/2022 FINAL  Final         Radiology Studies: No results found.      Scheduled Meds:  amLODipine  5 mg Oral Daily   diclofenac Sodium  2 g Topical QID   docusate sodium  100 mg Oral BID   feeding supplement  1 Bottle Oral TID AC & HS   folic acid  1 mg Oral Daily   furosemide  40 mg Oral BID WC   lactulose  15 g Oral BID   melatonin  5 mg Oral QHS   methocarbamol  500 mg Oral TID   multivitamin with minerals  1 tablet Oral Daily   predniSONE  30 mg Oral Q breakfast   senna  1 tablet Oral BID   spironolactone  100 mg Oral Daily   thiamine  100 mg Oral Daily   Or   thiamine  100 mg Intravenous Daily   Continuous Infusions:  sodium chloride Stopped (05/26/22 0412)   sodium chloride Stopped (05/26/22 0412)     LOS: 21 days    Tresa MooreSudheer B Magie Ciampa, MD [facility]   If 7PM-7AM, please contact night-coverage  06/01/2022, 11:23 AM

## 2022-06-02 LAB — COMPREHENSIVE METABOLIC PANEL
ALT: 90 U/L — ABNORMAL HIGH (ref 0–44)
AST: 148 U/L — ABNORMAL HIGH (ref 15–41)
Albumin: 2.4 g/dL — ABNORMAL LOW (ref 3.5–5.0)
Alkaline Phosphatase: 185 U/L — ABNORMAL HIGH (ref 38–126)
Anion gap: 9 (ref 5–15)
BUN: 20 mg/dL (ref 6–20)
CO2: 30 mmol/L (ref 22–32)
Calcium: 8.1 mg/dL — ABNORMAL LOW (ref 8.9–10.3)
Chloride: 91 mmol/L — ABNORMAL LOW (ref 98–111)
Creatinine, Ser: 0.67 mg/dL (ref 0.61–1.24)
GFR, Estimated: >60 mL/min (ref >60)
Glucose, Bld: 167 mg/dL — ABNORMAL HIGH (ref 70–99)
Potassium: 3.3 mmol/L — ABNORMAL LOW (ref 3.5–5.1)
Sodium: 130 mmol/L — ABNORMAL LOW (ref 135–145)
Total Bilirubin: 16.1 mg/dL — ABNORMAL HIGH (ref 0.3–1.2)
Total Protein: 7.3 g/dL (ref 6.5–8.1)

## 2022-06-02 LAB — MAGNESIUM: Magnesium: 1.7 mg/dL (ref 1.7–2.4)

## 2022-06-02 MED ORDER — AMLODIPINE BESYLATE 5 MG PO TABS: 5.0000 mg | ORAL_TABLET | Freq: Every day | ORAL | 0 refills | Status: AC

## 2022-06-02 MED ORDER — SPIRONOLACTONE 100 MG PO TABS: 100.0000 mg | ORAL_TABLET | Freq: Every day | ORAL | 0 refills | Status: AC

## 2022-06-02 MED ORDER — FOLIC ACID 1 MG PO TABS: 1.0000 mg | ORAL_TABLET | Freq: Every day | ORAL | 0 refills | Status: AC

## 2022-06-02 MED ORDER — LACTULOSE 10 GM/15ML PO SOLN: 15.0000 g | Freq: Two times a day (BID) | ORAL | 0 refills | Status: AC

## 2022-06-02 MED ORDER — POTASSIUM CHLORIDE CRYS ER 20 MEQ PO TBCR
40.0000 meq | EXTENDED_RELEASE_TABLET | Freq: Once | ORAL | Status: AC
  Administered 2022-06-02: 40 meq via ORAL
  Filled 2022-06-02: qty 2

## 2022-06-02 MED ORDER — FUROSEMIDE 40 MG PO TABS: 40.0000 mg | ORAL_TABLET | Freq: Two times a day (BID) | ORAL | 0 refills | Status: AC

## 2022-06-02 MED ORDER — MAGNESIUM SULFATE 2 GM/50ML IV SOLN: 2.0000 g | Freq: Once | INTRAVENOUS | Status: DC

## 2022-06-02 MED ORDER — METHOCARBAMOL 500 MG PO TABS: 500.0000 mg | ORAL_TABLET | Freq: Three times a day (TID) | ORAL | 0 refills | Status: AC

## 2022-06-02 MED ORDER — VITAMIN B-1 100 MG PO TABS: 100.0000 mg | ORAL_TABLET | Freq: Every day | ORAL | 0 refills | Status: AC

## 2022-06-02 MED ORDER — PREDNISONE 10 MG PO TABS: 30.0000 mg | ORAL_TABLET | Freq: Every day | ORAL | 0 refills | Status: AC

## 2022-06-02 MED ORDER — MAGNESIUM OXIDE -MG SUPPLEMENT 400 (240 MG) MG PO TABS
400.0000 mg | ORAL_TABLET | Freq: Once | ORAL | Status: AC
  Administered 2022-06-02: 400 mg via ORAL
  Filled 2022-06-02: qty 1

## 2022-06-02 MED ORDER — OXYCODONE HCL 5 MG PO TABS: 5.0000 mg | ORAL_TABLET | ORAL | 0 refills | Status: AC | PRN

## 2022-06-02 NOTE — Progress Notes (Signed)
MD order received in [facility] to discharge pt home today; TOC previously had a rolling walker delivered to his room for home use; verbally reviewed AVS with pt and gave pt printouts for foods with potassium and magnesium for home use; no questions voiced by the pt at this time; pt's discharge pending arrival of his ride home after 1500 today

## 2022-06-02 NOTE — Discharge Summary (Signed)
Physician Discharge Summary  Claudell KyleJonathan A Hust ZOX:[id number] DOB: 03/31/1995 DOA: 05/11/2022  PCP: Pcp, No  Admit date: 05/11/2022 Discharge date: 06/02/2022  Admitted From: Home Disposition:  Home  Recommendations for Outpatient Follow-up:  Follow up with PCP in 1-2 weeks Follow-up with GI Dr. Allegra LaiVanga in 4 weeks  Home Health:No Equipment/Devices: RW walker  Discharge Condition: Stable CODE STATUS: Full Diet recommendation: Regular  Brief/Interim Summary: 27 y.o. male with PMH significant for EtOH liver cirrhosis, ongoing EtOH use, Essential hypertension, presented in the ED with abdominal distention associated with abdominal pain for last few days.  Patient drinks  ETOH regularly, 1 pint of hard liquor every day.  Patient denies any nausea,  vomiting. Patient also reports subjective fever at night with chills, He denies any GI bleeding, dizziness, weakness, urinary symptoms, denies any recent travel or  URI symptoms.  03/18: ED workup: Sodium 131, potassium 2.5, anion gap 13, magnesium 1.8, alkaline phosphatase 107, albumin 2.1, lipase 75, AST 169, ALT 21, ALT 9.4, total bilirubin 28.4, total protein 9.4, WBC 14.4, hemoglobin 10.9, hematocrit 31.2, MCV 104.7, platelet 192, influenza negative, COVID-negative, RSV negative, UA: Ketones negative, LE negative, nitrate negative. CTA/P:  Hepatosplenomegaly, Hepatic cirrhosis, Distended gallbladder, Perigastric and perisplenic varices. Small ascites. Admitted for treatment and monitoring EtOH withdrawal  03/19:  GI saw patient. NPO 03/20: Advance to CLD. Pt is concerned about EtOH w/drawal, remains on CIWA protocol  03/21: liver doppler (+)cirrhosis and portal HTN, advancing to full liquids.  03/22: advancing diet. (+)cough and rales, CXR showing cardiomegaly and vascular congestion --> lasix + albumin, Echo to eval further.  03/23: no concerns on Echocardiogram w/ EF 60-65% no RWMA and no diastolic df, diuresed well yesterday will repeat Lasix  today. Dr Servando SnareWohl w/ GI checked up on previous paracentesis labs and no SBP, ok to start steroids, he will be placing these orders. Given deterioration on labs / increased MELD score, Dr Servando SnareWohl [facility] - given previous pancreatitis treatment, patient is not a transplant candidate.  03/24: hyponatremia worse to 123, reduced spironolactone. GI following. D/c abx. Bili slightly improved on steroids. GI and hospitalist teams have discussed that he is not a transplant candidate - see GI note from today.  03/25: sodium improved to 126. Bili slightly improved as well to 25. Continuing steroids. Per GI, "advanced liver cirrhosis likely from alcohol abuse. His other labs did not show any cause for his cirrhosis. The patient should continue with supportive care. Nothing further to do from a GI point of view at this time." 03/26: bilirubin down to 24, sodium 128, WBC increased to 18 w/ neutrophils, likely d/t steroids however pt reports subjective fever/chills, no cough, abdominal pain is stable. Getting CXR given some concern for infiltrate on previous but no obvious pneumonia, low threshold for restarting ceftriaxone, he declined paracentesis but may need to revisit this. Will leave on steroids for now, low suspicion for infection. Increased lasix. 3/27.  Patient hesitant about going home.  I will uptitrate spironolactone so hopefully we can get off potassium supplementation.  Declined another paracentesis. 3/28.  Patient does not feel well today.  Did not sleep well.  Declined paracentesis. 3/29.  Patient feels worse today with nausea vomiting, abdominal pain.  Patient also has left leg pain left lateral thigh.  Patient declined paracentesis.  Patient declined MRI of his abdomen and or his thigh.  Will continue to monitor.  With temperature dropping to 95.5 will start empiric antibiotics. 3/30.  White blood cell  count down slightly to 24.8.  Patient still having pain in the left leg.  Ultrasound  left lower extremity negative for DVT.  Wondering if pain is secondary to high-dose steroids.  Will start to taper prednisone for tomorrow down to 30 mg daily.  CK normal range.  Patient declined MRI abdomen or MRI left leg. 3/31.  Patient still not feeling well.  Asking about prognosis.  Left leg pain persists.  MRI of the leg showed a large fluid collection likely hematoma 4/1.  Spoke with interventional radiology team.  They got an ultrasound and did a fluid aspiration and they think it is hematoma.  Red blood cells 11,413.  White blood cell count 2817 with 72% lymphocytes.  On empiric Rocephin.  Await Gram stain and culture. 4/2.  Gram stain still pending but culture shows no growth after 1 day.  Patient still having a lot of pain and increased oxycodone to 10 mg as needed. 4/3: Culture from aspirated fluid collection NGTD.  Pain control improving.  Still swollen 4/4: Bilirubin bumped up to 20 4/5: Bilirubin downtrending.  Now at 17. 4/6: Bilirubin stable at 17.  Abdominal ultrasound negative for ascites.  Thigh hematoma decreasing in size. 4/7: No status changes 4/8: Bili downtrending 5/9: Bilirubin has adequately downtrended.  Patient mentating clearly.  Pain reasonably well-controlled.  Stable for discharge at this time.  Lengthy discussion regarding the importance of medication adherence and regular follow-up.  Patient expressed understanding.  Will discharge home on prednisone 30 mg daily.  Will not taper.  1 month supply provided.  Can discuss further steroid regimen titration and tapering at GI visit in 4 weeks.  Patient will follow-up with Dr. Allegra Lai.  Clear return to ED instructions have been provided.  Patient has significant medical comorbidities and will remain high risk for hospital readmission.   Discharge Diagnoses:  Principal Problem:   Alcoholic hepatitis with ascites Active Problems:   Leukocytosis   Acute pain of left thigh   Alcoholic cirrhosis   Acute hepatic  encephalopathy   Alcohol dependence with withdrawal   Elevated bilirubin   Hypokalemia   GERD (gastroesophageal reflux disease)   Obesity (BMI 30-39.9)   Generalized abdominal pain   Chronic diastolic CHF (congestive heart failure)   Hyponatremia   Hypocalcemia   Anemia of chronic disease  Alcoholic hepatitis with ascites Continue prednisone 30 mg daily.  Discontinued antibiotics 4/6.  Will discharge on 1 month supply of prednisone 30 mg daily.  Follow-up outpatient GI in 4 weeks with Dr. Allegra Lai for further titration of steroid regimen.   Acute pain of left thigh MRI left leg showed a large fluid collection.  Ultrasound negative for DVT.  CK and uric acid normal.  Interventional radiology did a drainage on 4/1.Marland Kitchen  They felt it was most likely hematoma.  Red blood cells 11,413 but white blood cells also elevated at 2817.  Lovenox discontinued on 3/31.  Heating pad.  Pain control with oral and IV medications.  Continue p.o. vitamin K x 3 days, now complete.  DC IV narcotic 4/8.  Continue muscle relaxant added 4/3.  Repeat nonvascular ultrasound demonstrates reduction in fluid collection size.  Antibiotic stopped.  PT/OT engaged.  Discharged without follow-up PT recommendations on 4/9.  Follow-up outpatient GI   leukocytosis Likely driven by steroid administration.  No indication for antibiotics.  No evidence for infectious etiology   Acute hepatic encephalopathy Continue lactulose for ammonia level of 70.  Spoke with patient about the goal bowel movement is  3/day.  Mental status intact.  Discharged on p.o. lactulose twice daily   Alcoholic cirrhosis Advised patient that he must stop drinking in order to be a candidate for a liver transplant.  Calculated Childs class Pugh score of 13 which would be class C which would be a 65% mortality within 2 years.  Palliative care consultation.  GI spoke to transplant team at [facility].  At this time patient is not a candidate for liver transplantation due to  recurrent alcoholic pancreatitis.  Guarded prognosis.  No further recommendations from palliative care.  Patient declines further palliative follow-up   Elevated bilirubin Total bilirubin dropped 18 on 4/3, increased slightly to 20 on 4/4.  Downtrending to 17 as of 4/5.  Appears consistent at this level.  Likely will take several weeks to trend down.   Alcohol dependence with withdrawal No current signs of any withdrawal.   Hypokalemia With spironolactone at 100 mg daily.  Monitor and replace as necessary   Anemia of chronic disease Hemoglobin stable   Hypocalcemia Corrected calcium normal range.   Hyponatremia Likely secondary to alcohol and cirrhosis   Chronic diastolic CHF (congestive heart failure) Likely secondary to liver cirrhosis and anasarca.   Obesity (BMI 30-39.9) BMI 35.79   Discharge Instructions  Discharge Instructions     Diet - low sodium heart healthy   Complete by: As directed    Increase activity slowly   Complete by: As directed    No wound care   Complete by: As directed       Allergies as of 06/02/2022   No Known Allergies      Medication List     STOP taking these medications    albuterol 108 (90 Base) MCG/ACT inhaler Commonly known as: VENTOLIN HFA       TAKE these medications    amLODipine 5 MG tablet Commonly known as: NORVASC Take 1 tablet (5 mg total) by mouth daily.   folic acid 1 MG tablet Commonly known as: FOLVITE Take 1 tablet (1 mg total) by mouth daily.   furosemide 40 MG tablet Commonly known as: LASIX Take 1 tablet (40 mg total) by mouth 2 (two) times daily. What changed: when to take this   lactulose 10 GM/15ML solution Commonly known as: CHRONULAC Take 22.5 mLs (15 g total) by mouth 2 (two) times daily.   methocarbamol 500 MG tablet Commonly known as: ROBAXIN Take 1 tablet (500 mg total) by mouth 3 (three) times daily.   multivitamin with minerals Tabs tablet Take 1 tablet by mouth daily.    oxyCODONE 5 MG immediate release tablet Commonly known as: Oxy IR/ROXICODONE Take 1 tablet (5 mg total) by mouth every 4 (four) hours as needed for moderate pain.   predniSONE 10 MG tablet Commonly known as: DELTASONE Take 3 tablets (30 mg total) by mouth daily with breakfast. Start taking on: June 03, 2022   spironolactone 100 MG tablet Commonly known as: ALDACTONE Take 1 tablet (100 mg total) by mouth daily.   thiamine 100 MG tablet Commonly known as: Vitamin B-1 Take 1 tablet (100 mg total) by mouth daily. Start taking on: June 03, 2022               Durable Medical Equipment  (From admission, onward)           Start     Ordered   06/02/22 1012  For home use only DME Walker rolling  Once       Question Answer Comment  Walker: With 5 Inch Wheels   Patient needs a walker to treat with the following condition Weakness      06/02/22 1011            Follow-up Information     [facility] Follow up in 1 week(s).   Specialty: Primary Care Contact information: [address] [place] [address] [phone]        Toney Reil, MD Follow up on 09/21/2022.   Specialty: Gastroenterology Why: @1 :45pm Contact information: [address] [place] [address] [phone]                No Known Allergies  Consultations: GI   Procedures/Studies: [place] LT LOWER EXTREM LTD SOFT TISSUE NON VASCULAR  Result Date: 05/29/2022 CLINICAL DATA:  Fluid collection.  Previous aspiration EXAM: ULTRASOUND left LOWER EXTREMITY LIMITED TECHNIQUE: Ultrasound examination of the lower extremity soft tissues was performed in the area of clinical concern. COMPARISON:  05/25/2022. FINDINGS: Left lateral thigh fluid collection now measures 4.0 x 2.2 x 1.2 cm, smaller than the prior study after previous aspiration. The appearance is nonspecific and can be seen with old blood, seroma or abscess. IMPRESSION: Left lateral  thigh fluid collection Has decreased status post aspiration. Electronically Signed   By: Layla Maw M.D.   On: 05/29/2022 16:32   US Abdomen Limited  Result Date: 05/29/2022 CLINICAL DATA:  Ascites EXAM: LIMITED ABDOMEN ULTRASOUND FOR ASCITES TECHNIQUE: Limited ultrasound survey for ascites was performed in all four abdominal quadrants. COMPARISON:  05/18/2022. FINDINGS: Evaluation for ascites demonstrates minimal abdominal ascites. There was no drainable collection. IMPRESSION: Minimal ascites was seen. Electronically Signed   By: Layla Maw M.D.   On: 05/29/2022 12:23   [place] IMAGE GUIDED FLUID DRAIN BY CATHETER  Result Date: 05/25/2022 INDICATION: Leg pain EXAM: Ultrasound-guided aspiration of left lower extremity fluid collection MEDICATIONS: Documented in the EMR ANESTHESIA/SEDATION: Moderate (conscious) sedation was employed during this procedure. A total of Versed 2 mg and Fentanyl 100 mcg was administered intravenously by the radiology nurse. Total intra-service moderate Sedation Time: 10 minutes. The patient's level of consciousness and vital signs were monitored continuously by radiology nursing throughout the procedure under my direct supervision. COMPLICATIONS: None immediate. PROCEDURE: Informed written consent was obtained from the patient after a thorough discussion of the procedural risks, benefits and alternatives. All questions were addressed. Maximal Sterile Barrier Technique was utilized including caps, mask, sterile gowns, sterile gloves, sterile drape, hand hygiene and skin antiseptic. A timeout was performed prior to the initiation of the procedure. The patient was placed supine on the exam table. Ultrasound of the left lower extremity left lateral thigh demonstrated complex but otherwise indeterminate fluid collection. Skin entry site was marked, and the overlying skin was prepped draped in the standard sterile fashion. Local analgesia was obtained with 1% lidocaine. Using  ultrasound guidance, a 19 gauge Yueh catheter was advanced into the largest component of the fluid collection. Thin serosanguineous fluid was returned. A sample was sent to the lab for analysis. A total of approximately 65 mL of fluid was aspirated of similar composition. Drainage catheter was removed. Clean dressing was placed patient tolerated the procedure well without immediate complication. IMPRESSION: Successful ultrasound-guided aspiration of left lower extremity fluid collection, yielding approximately 65 mL of serosanguineous fluid. Sample sent for microbiology analysis. Findings are suggestive of liquefied hematoma. No drain placed. Electronically Signed   By: Olive Bass M.D.   On: 05/25/2022 12:53  [place] LT LOWER EXTREM LTD SOFT TISSUE NON VASCULAR  Result Date: 05/25/2022 CLINICAL DATA:  Concern for left leg hematoma EXAM: ULTRASOUND LEFT LOWER EXTREMITY LIMITED TECHNIQUE: Ultrasound examination of the lower extremity soft tissues was performed in the area of clinical concern. COMPARISON:  None Available. FINDINGS: Focused sonographic exam of the left lower extremity area of concern was performed. Exam was performed along the left lateral thigh. Images demonstrate a complex collection in the left parietal thigh spanning an approximate area of 19.7 cm, with largest anechoic single fluid collection measuring up to 8.2 cm. Limited vascularity in this region. IMPRESSION: Ultrasound of the left lateral thigh demonstrates complex area with a single largest fluid collection measuring up to 8.2 cm. Findings are indeterminate by ultrasound, with both hematoma and infection included in the differential. Consider direct sampling as clinically appropriate. Electronically Signed   By: Olive Bass M.D.   On: 05/25/2022 11:20   MR FEMUR LEFT W WO CONTRAST  Result Date: 05/24/2022 CLINICAL DATA:  Leg pain and swelling. Concern for steroid myopathy. Leukocytosis. EXAM: MR OF THE LEFT LOWER EXTREMITY WITHOUT  AND WITH CONTRAST TECHNIQUE: Multiplanar, multisequence MR imaging of the left thigh was performed both before and after administration of intravenous contrast. CONTRAST:  10mL GADAVIST GADOBUTROL 1 MMOL/ML IV SOLN COMPARISON:  Left lower extremity venous Doppler ultrasound 05/23/2022. FINDINGS: Bones/Joint/Cartilage Coronal images include both thighs. Both femurs appear normal. There is no evidence of acute fracture, dislocation suspicious osseous lesion. No significant hip arthropathy or effusion. There is a small nonspecific left knee joint effusion. No suspicious synovial or marrow enhancement. Ligaments No significant ligamentous abnormalities are identified. Muscles and Tendons There is a large complex intramuscular fluid collection lateral to the left mid femur, within the vastus intermedius muscle. This collection measures approximately 13.7 x 5.6 x 5.1 cm and demonstrates multiple internal fluid-fluid levels. There are dependent components with mild intrinsic T1 shortening, suggesting hematocrit affect related to a hematoma. Post-contrast, there is no significant central or peripheral enhancement of this collection. There is diffuse edema throughout the quadriceps musculature, also greatest within the vastus medialis. There is ill-defined fluid along the fascial planes, extending into the adductor compartment in the proximal aspect of the left thigh. There is no suspicious enhancement associated with these areas. The left hamstring muscles appear normal. No significant muscular abnormalities are seen in the right thigh. The left hip and knee tendons appear intact. Soft tissues Subcutaneous edema in both thighs, greater on the left. Apart from the complex fluid collection in the left vastus intermedius muscle, no other focal fluid collections are seen. No acute vascular findings are evident. The left thigh edema is not perivascular in distribution. IMPRESSION: 1. Large complex intramuscular fluid collection  within the left vastus intermedius muscle with multiple internal fluid-fluid levels, most consistent with a hematoma. There is no significant central or peripheral enhancement to suggest an abscess. This could be aspirated if there is clinical concern for infection. 2. Associated diffuse edema throughout the quadriceps musculature and ill-defined fluid along the fascial planes in the proximal aspect of the left thigh, nonspecific. Findings may be reactive to the hematoma, posttraumatic or inflammatory. No significant right thigh muscular abnormalities are identified. 3. No acute osseous findings. Small nonspecific left knee joint effusion. Electronically Signed   By: Carey Bullocks M.D.   On: 05/24/2022 13:34   US Venous Img Lower Unilateral Left (DVT)  Result Date: 05/23/2022 CLINICAL DATA:  Left lower extremity pain and edema. EXAM: LEFT LOWER EXTREMITY  VENOUS DOPPLER ULTRASOUND TECHNIQUE: Gray-scale sonography with graded compression, as well as color Doppler and duplex ultrasound were performed to evaluate the lower extremity deep venous systems from the level of the common femoral vein and including the common femoral, femoral, profunda femoral, popliteal and calf veins including the posterior tibial, peroneal and gastrocnemius veins when visible. The superficial great saphenous vein was also interrogated. Spectral Doppler was utilized to evaluate flow at rest and with distal augmentation maneuvers in the common femoral, femoral and popliteal veins. COMPARISON:  None Available. FINDINGS: Contralateral Common Femoral Vein: Respiratory phasicity is normal and symmetric with the symptomatic side. No evidence of thrombus. Normal compressibility. Common Femoral Vein: No evidence of thrombus. Normal compressibility, respiratory phasicity and response to augmentation. Saphenofemoral Junction: No evidence of thrombus. Normal compressibility and flow on color Doppler imaging. Profunda Femoral Vein: No evidence of  thrombus. Normal compressibility and flow on color Doppler imaging. Femoral Vein: No evidence of thrombus. Normal compressibility, respiratory phasicity and response to augmentation. Popliteal Vein: No evidence of thrombus. Normal compressibility, respiratory phasicity and response to augmentation. Calf Veins: No evidence of thrombus. Normal compressibility and flow on color Doppler imaging. Superficial Great Saphenous Vein: No evidence of thrombus. Normal compressibility. Venous Reflux:  None. Other Findings: No evidence of superficial thrombophlebitis or abnormal fluid collection. IMPRESSION: No evidence of left lower extremity deep venous thrombosis. Electronically Signed   By: Irish Lack M.D.   On: 05/23/2022 10:33   DG Chest 2 View  Result Date: 05/22/2022 CLINICAL DATA:  Leukocytosis. EXAM: CHEST - 2 VIEW COMPARISON:  05/19/2022. FINDINGS: 0743 hours. Low lung volumes accentuate the pulmonary vasculature and cardiomediastinal silhouette. Scattered linear opacities in the left-greater-than-right lungs, likely reflect subsegmental atelectasis. No consolidation. No pleural effusion or pneumothorax. IMPRESSION: Low lung volumes with scattered subsegmental atelectasis. No consolidation. Electronically Signed   By: Orvan Falconer M.D.   On: 05/22/2022 08:37   DG Chest Port 1 View  Result Date: 05/19/2022 CLINICAL DATA:  Prior abnormal chest x-ray, history of cough EXAM: PORTABLE CHEST 1 VIEW COMPARISON:  05/15/2022, 11/21/2019 FINDINGS: Single frontal view of the chest demonstrates persistent enlargement of the cardiac silhouette. There is chronic vascular congestion, with persistent left perihilar airspace disease. No effusion or pneumothorax. No acute bony abnormalities. IMPRESSION: 1. Constellation of findings most suggestive of congestive heart failure and mild asymmetric edema. Underlying left-sided pneumonia would be difficult to exclude. Continued follow-up after appropriate medical management  is recommended, with PA and lateral chest x-ray recommended. Electronically Signed   By: Sharlet Salina M.D.   On: 05/19/2022 13:33   US Abdomen Limited  Result Date: 05/18/2022 CLINICAL DATA:  [id number] Hepatic failure (HCC) [id number] EXAM: LIMITED ABDOMEN ULTRASOUND FOR ASCITES TECHNIQUE: Limited ultrasound survey for ascites was performed in all four abdominal quadrants. COMPARISON:  None Available. FINDINGS: Limited sonographic exam of the abdomen was performed for fluid assessment. Images demonstrate small amount of perihepatic ascites. The patient declined paracentesis. IMPRESSION: Small volume perihepatic ascites. The patient declined paracentesis. Electronically Signed   By: Olive Bass M.D.   On: 05/18/2022 12:12   ECHOCARDIOGRAM COMPLETE  Result Date: 05/15/2022    ECHOCARDIOGRAM REPORT   Patient Name:   NUNO BRUBACHER Date of Exam: 05/15/2022 Medical Rec #:  [id number]          Height:       71.0 in Accession #:    [id number]         Weight:       248.2 lb Date  of Birth:  1995-09-16           BSA:          2.311 m Patient Age:    26 years           BP:           124/66 mmHg Patient Gender: M                  HR:           86 bpm. Exam Location:  [facility] Procedure: 2D Echo, Cardiac Doppler and Color Doppler Indications:     Cardiomegaly I51.7  History:         Patient has no prior history of Echocardiogram examinations.                  Alcohol abuse, GERD.  Sonographer:     Cristela Blue Referring Phys:  [id number] Sunnie Nielsen Diagnosing Phys: Julien Nordmann MD IMPRESSIONS  1. Left ventricular ejection fraction, by estimation, is 60 to 65%. The left ventricle has normal function. The left ventricle has no regional wall motion abnormalities. Left ventricular diastolic parameters were normal.  2. Right ventricular systolic function is normal. The right ventricular size is normal.  3. The mitral valve is normal in structure. Mild mitral valve regurgitation. No evidence of mitral stenosis.  4. The aortic  valve is normal in structure. Aortic valve regurgitation is not visualized. No aortic stenosis is present.  5. The inferior vena cava is normal in size with greater than 50% respiratory variability, suggesting right atrial pressure of 3 mmHg. FINDINGS  Left Ventricle: Left ventricular ejection fraction, by estimation, is 60 to 65%. The left ventricle has normal function. The left ventricle has no regional wall motion abnormalities. The left ventricular internal cavity size was normal in size. There is  no left ventricular hypertrophy. Left ventricular diastolic parameters were normal. Right Ventricle: The right ventricular size is normal. No increase in right ventricular wall thickness. Right ventricular systolic function is normal. Left Atrium: Left atrial size was normal in size. Right Atrium: Right atrial size was normal in size. Pericardium: There is no evidence of pericardial effusion. Mitral Valve: The mitral valve is normal in structure. Mild mitral valve regurgitation. No evidence of mitral valve stenosis. MV peak gradient, 8.8 mmHg. The mean mitral valve gradient is 3.0 mmHg. Tricuspid Valve: The tricuspid valve is normal in structure. Tricuspid valve regurgitation is mild . No evidence of tricuspid stenosis. Aortic Valve: The aortic valve is normal in structure. Aortic valve regurgitation is not visualized. No aortic stenosis is present. Aortic valve mean gradient measures 9.0 mmHg. Aortic valve peak gradient measures 13.7 mmHg. Aortic valve area, by VTI measures 2.54 cm. Pulmonic Valve: The pulmonic valve was normal in structure. Pulmonic valve regurgitation is not visualized. No evidence of pulmonic stenosis. Aorta: The aortic root is normal in size and structure. Venous: The inferior vena cava is normal in size with greater than 50% respiratory variability, suggesting right atrial pressure of 3 mmHg. IAS/Shunts: No atrial level shunt detected by color flow Doppler.  LEFT VENTRICLE PLAX 2D LVIDd:          5.70 cm   Diastology LVIDs:         3.50 cm   LV e' medial:    13.20 cm/s LV PW:         1.20 cm   LV E/e' medial:  9.8 LV IVS:        0.90  cm   LV e' lateral:   19.60 cm/s LVOT diam:     2.00 cm   LV E/e' lateral: 6.6 LV SV:         91 LV SV Index:   39 LVOT Area:     3.14 cm  RIGHT VENTRICLE RV Basal diam:  4.40 cm RV Mid diam:    4.50 cm LEFT ATRIUM             Index        RIGHT ATRIUM           Index LA diam:        3.90 cm 1.69 cm/m   RA Area:     12.80 cm LA Vol (A2C):   35.0 ml 15.14 ml/m  RA Volume:   28.90 ml  12.50 ml/m LA Vol (A4C):   51.6 ml 22.32 ml/m LA Biplane Vol: 47.1 ml 20.38 ml/m  AORTIC VALVE AV Area (Vmax):    2.39 cm AV Area (Vmean):   2.36 cm AV Area (VTI):     2.54 cm AV Vmax:           185.00 cm/s AV Vmean:          137.000 cm/s AV VTI:            0.359 m AV Peak Grad:      13.7 mmHg AV Mean Grad:      9.0 mmHg LVOT Vmax:         141.00 cm/s LVOT Vmean:        103.000 cm/s LVOT VTI:          0.290 m LVOT/AV VTI ratio: 0.81  AORTA Ao Root diam: 3.10 cm MITRAL VALVE                TRICUSPID VALVE MV Area (PHT): 4.08 cm     TR Peak grad:   34.6 mmHg MV Area VTI:   3.41 cm     TR Vmax:        294.00 cm/s MV Peak grad:  8.8 mmHg MV Mean grad:  3.0 mmHg     SHUNTS MV Vmax:       1.48 m/s     Systemic VTI:  0.29 m MV Vmean:      83.1 cm/s    Systemic Diam: 2.00 cm MV Decel Time: 186 msec MV E velocity: 130.00 cm/s MV A velocity: 66.60 cm/s MV E/A ratio:  1.95 Julien Nordmann MD Electronically signed by Julien Nordmann MD Signature Date/Time: 05/15/2022/3:27:32 PM    Final    DG Chest Port 1 View  Result Date: 05/15/2022 CLINICAL DATA:  Cough EXAM: PORTABLE CHEST 1 VIEW COMPARISON:  11/21/2019 x-ray FINDINGS: Enlarged cardiopericardial silhouette. Central vascular congestion. No pneumothorax or effusion. There is some linear opacity left midlung likely scar or atelectasis. IMPRESSION: Underinflation with enlarged cardiopericardial silhouette with vascular congestion. Subtle left  midlung opacity as well. Recommend follow-up Electronically Signed   By: Karen Kays M.D.   On: 05/15/2022 12:30   US LIVER DOPPLER  Result Date: 05/14/2022 CLINICAL DATA:  Liver failure EXAM: DUPLEX ULTRASOUND OF LIVER TECHNIQUE: Color and duplex Doppler ultrasound was performed to evaluate the hepatic in-flow and out-flow vessels. COMPARISON:  None Available. FINDINGS: Liver: Enlarged liver with heterogeneous and echogenic hepatic parenchyma. No discrete hepatic lesions are identified. No focal lesion, mass or intrahepatic biliary ductal dilatation. Main Portal Vein size: 1.2 cm Portal Vein Velocities Main Prox:  23 cm/sec with sluggish  bidirectional flow. Main Mid: 36 cm/sec with sluggish bidirectional flow. Main Dist:  13 cm/sec with sluggish bidirectional flow. Right: 21 cm/sec with hepatopetal flow. Left: 47 cm/sec Hepatic Vein Velocities Right:  22 cm/sec Middle:  29 cm/sec Left:  24 cm/sec IVC: Present and patent with normal respiratory phasicity. Hepatic Artery Velocity:  105 cm/sec Splenic Vein Velocity:  14 cm/sec Spleen: 21 cm x 8.6 cm x 19.8 cm with a total volume of 1,889 cm^3 (411 cm^3 is upper limit normal) Portal Vein Occlusion/Thrombus: No Splenic Vein Occlusion/Thrombus: No Ascites: Trace ascites. Varices: Recanalized periumbilical vein visualized. IMPRESSION: 1. Hepatic cirrhosis with portal hypertension as evidence by sluggish bidirectional flow in the portal vein, recanalized periumbilical vein and significant splenomegaly. 2. Splenomegaly. 3. No discrete hepatic lesion. Signed, Sterling Big, MD, RPVI [facility] Radiology Electronically Signed   By: Malachy Moan M.D.   On: 05/14/2022 16:27   US Paracentesis  Result Date: 05/12/2022 INDICATION: Ascites, possible spontaneous bacterial peritonitis EXAM: ULTRASOUND GUIDED  PARACENTESIS MEDICATIONS: Lidocaine 1% subcutaneous COMPLICATIONS: None immediate. PROCEDURE: Informed  written consent was obtained from the patient after a discussion of the risks, benefits and alternatives to treatment. A timeout was performed prior to the initiation of the procedure. Initial ultrasound scanning demonstrates a small amount of infrahepatic ascites within the right lower abdominal quadrant. The right lower abdomen was prepped and draped in the usual sterile fashion. 1% lidocaine was used for local anesthesia. Following this, under real-time ultrasound guidance a Safe-T-Centesis catheter was introduced. An ultrasound image was saved for documentation purposes. The paracentesis was performed. Only scant volume obtained. The catheter was removed and a dressing was applied. The patient tolerated the procedure well without immediate post procedural complication. FINDINGS: A total of less than 5 mL clear yellow fluid was removed. Samples were sent to the laboratory as requested by the clinical team. IMPRESSION: Successful ultrasound-guided paracentesis yielding less than 5 mL of peritoneal fluid. Electronically Signed   By: Corlis Leak M.D.   On: 05/12/2022 15:11   US ABDOMEN LIMITED RUQ (LIVER/GB)  Result Date: 05/11/2022 CLINICAL DATA:  Abdominal pain EXAM: ULTRASOUND ABDOMEN LIMITED RIGHT UPPER QUADRANT COMPARISON:  No prior right upper quadrant ultrasound available, correlation is made with 05/11/2022 CT abdomen pelvis FINDINGS: Gallbladder: Level sludge is noted within the gallbladder. No gallstones. The gallbladder is somewhat distended, with the wall measuring up to 4 mm, just above the upper limit of normal. No pericholecystic fluid. No sonographic Murphy sign noted by sonographer. Common bile duct: Diameter: 6 mm, within normal limits. No intrahepatic biliary ductal dilatation. Liver: No focal lesion identified. Increased parenchymal echogenicity, which limits sonographic penetration. Portal vein is patent on color Doppler imaging with likely reversal of blood flow, away from the liver. Other:  None. IMPRESSION: 1. Gallbladder sludge and mild wall thickening, just above upper limit of normal. No pericholecystic fluid or sonographic Murphy sign. Findings are equivocal for acute cholecystitis and may be related to the patient's history of cirrhosis. 2. Portal vein is patent with likely reversal of blood flow. Electronically Signed   By: Wiliam Ke M.D.   On: 05/11/2022 14:17   CT Abdomen Pelvis W Contrast  Result Date: 05/11/2022 CLINICAL DATA:  Abdominal pain EXAM: CT ABDOMEN AND PELVIS WITH CONTRAST TECHNIQUE: Multidetector CT imaging of the abdomen and pelvis was performed using the standard protocol following bolus administration of intravenous contrast. RADIATION DOSE REDUCTION: This exam was performed according to the departmental dose-optimization program which includes automated  exposure control, adjustment of the mA and/or kV according to patient size and/or use of iterative reconstruction technique. CONTRAST:  100 mL OMNIPAQUE IOHEXOL 300 MG/ML  SOLN COMPARISON:  03/12/2022 FINDINGS: Lower chest: Bibasilar linear subsegmental atelectasis. No pleural or pericardial effusion identified. Perigastric and perisplenic varices. Hepatobiliary: Distended gallbladder. Enlarged liver with a slightly nodular contour. No focal hepatic parenchymal lesions or biliary ductal dilatation identified. Pancreas: Unremarkable. No pancreatic ductal dilatation or surrounding inflammatory changes. Spleen: Grossly enlarged, 19 cm Adrenals/Urinary Tract: Adrenal glands are unremarkable. Kidneys are normal, without renal calculi, focal lesion, or hydronephrosis. Bladder is unremarkable. Stomach/Bowel: Stomach is within normal limits. Appendix appears normal. No evidence of bowel wall thickening, distention, or inflammatory changes. Vascular/Lymphatic: No suspicious adenopathy identified. No aortic aneurysm. Reproductive: Prostate is unremarkable. Other: No abdominal wall hernia or abnormality. Small amounts of ascites  noted along the pericolic gutters And in the pelvis. Musculoskeletal: No acute or significant osseous findings. IMPRESSION: 1. Hepatosplenomegaly. 2. Hepatic cirrhosis. 3. Distended gallbladder. 4. Perigastric and perisplenic varices. 5. Small ascites. Electronically Signed   By: Layla Maw M.D.   On: 05/11/2022 12:05      Subjective: Seen and on the day of discharge.  Stable no distress.  Pain well-controlled.  Stable for discharge home.  Discharge Exam: Vitals:   06/02/22 0436 06/02/22 0757  BP: (!) 149/67 (!) 145/71  Pulse: 96 93  Resp: 16 20  Temp: 97.7 F (36.5 C) 99 F (37.2 C)  SpO2: 99% 100%   Vitals:   06/01/22 1614 06/01/22 2029 06/02/22 0436 06/02/22 0757  BP: 134/65 137/77 (!) 149/67 (!) 145/71  Pulse: 96 97 96 93  Resp: 20 18 16 20   Temp: 98.2 F (36.8 C) 97.6 F (36.4 C) 97.7 F (36.5 C) 99 F (37.2 C)  TempSrc: Oral Oral Oral Oral  SpO2: 100% 100% 99% 100%  Weight:      Height:        General: Pt is alert, awake, not in acute distress Cardiovascular: RRR, S1/S2 +, no rubs, no gallops Respiratory: CTA bilaterally, no wheezing, no rhonchi Abdominal: Soft, NT, ND, bowel sounds + Extremities: no edema, no cyanosis    The results of significant diagnostics from this hospitalization (including imaging, microbiology, ancillary and laboratory) are listed below for reference.     Microbiology: Recent Results (from the past 240 hour(s))  Body fluid culture w Gram Stain     Status: None   Collection Time: 05/25/22 12:40 PM   Specimen: Leg; Body Fluid  Result Value Ref Range Status   Specimen Description   Final    LEG LEFT Performed at [facility], [address], [place], [place] [address]    Special Requests   Final    Normal Performed at [facility], [address] [place], [place] [address]    Gram Stain NO WBC SEEN NO ORGANISMS SEEN   Final   Culture   Final    NO GROWTH 3 DAYS Performed at [facility], [address], [place], [place] [address]    Report Status 05/28/2022 FINAL  Final     Labs: BNP (last 3 results) No results for input(s): "BNP" in the last 8760 hours. Basic Metabolic Panel: Recent Labs  Lab 05/29/22 0457 05/30/22 0411 05/31/22 0553 06/01/22 0517 06/02/22 0444  NA 129* 128* 128* 127* 130*  K 3.9 3.3* 3.4* 3.3* 3.3*  CL 92* 90* 91* 90* 91*  CO2 27 28 28 29 30   GLUCOSE  155* 151* 112* 159* 167*  BUN 21* 22* 18 18 20   CREATININE 0.66 0.58* 0.60* 0.55* 0.67  CALCIUM 8.2* 8.3* 8.4* 8.3* 8.1*  MG  --   --   --   --  1.7   Liver Function Tests: Recent Labs  Lab 05/29/22 0457 05/30/22 0411 05/31/22 0553 06/01/22 0517 06/02/22 0444  AST 151* 177* 172* 142* 148*  ALT 83* 90* 97* 88* 90*  ALKPHOS 194* 183* 200* 206* 185*  BILITOT 17.8* 17.9* 18.0* 16.6* 16.1*  PROT 6.9 7.1 7.1 7.2 7.3  ALBUMIN 2.1* 2.3* 2.2* 2.3* 2.4*   No results for input(s): "LIPASE", "AMYLASE" in the last 168 hours. No results for input(s): "AMMONIA" in the last 168 hours. CBC: Recent Labs  Lab 05/27/22 0439 05/28/22 0753 05/29/22 0457 05/30/22 0411 05/31/22 0553  WBC 25.1* 24.0* 23.3* 22.9* 21.5*  NEUTROABS  --  20.2* 19.4* 19.0* 18.0*  HGB 8.2* 9.1* 8.5* 8.7* 9.0*  HCT 23.1* 26.0* 24.1* 25.2* 25.8*  MCV 108.5* 109.7* 109.5* 109.6* 108.4*  PLT 178 188 165 161 154   Cardiac Enzymes: No results for input(s): "CKTOTAL", "CKMB", "CKMBINDEX", "TROPONINI" in the last 168 hours. BNP: Invalid input(s): "POCBNP" CBG: No results for input(s): "GLUCAP" in the last 168 hours. D-Dimer No results for input(s): "DDIMER" in the last 72 hours. Hgb A1c No results for input(s): "HGBA1C" in the last 72 hours. Lipid Profile No results for input(s): "CHOL", "HDL", "LDLCALC", "TRIG", "CHOLHDL", "LDLDIRECT" in the last 72 hours. Thyroid function studies No results for input(s): "TSH", "T4TOTAL", "T3FREE", "THYROIDAB" in the last 72 hours.  Invalid input(s): "FREET3" Anemia work up No results for  input(s): "VITAMINB12", "FOLATE", "FERRITIN", "TIBC", "IRON", "RETICCTPCT" in the last 72 hours. Urinalysis    Component Value Date/Time   COLORURINE AMBER (A) 05/11/2022 1447   APPEARANCEUR CLEAR (A) 05/11/2022 1447   LABSPEC 1.033 (H) 05/11/2022 1447   PHURINE 7.0 05/11/2022 1447   GLUCOSEU NEGATIVE 05/11/2022 1447   HGBUR SMALL (A) 05/11/2022 1447   BILIRUBINUR MODERATE (A) 05/11/2022 1447   KETONESUR NEGATIVE 05/11/2022 1447   PROTEINUR NEGATIVE 05/11/2022 1447   NITRITE NEGATIVE 05/11/2022 1447   LEUKOCYTESUR NEGATIVE 05/11/2022 1447   Sepsis Labs Recent Labs  Lab 05/28/22 0753 05/29/22 0457 05/30/22 0411 05/31/22 0553  WBC 24.0* 23.3* 22.9* 21.5*   Microbiology Recent Results (from the past 240 hour(s))  Body fluid culture w Gram Stain     Status: None   Collection Time: 05/25/22 12:40 PM   Specimen: Leg; Body Fluid  Result Value Ref Range Status   Specimen Description   Final    LEG LEFT Performed at [facility], [address], [place], [place] [address]    Special Requests   Final    Normal Performed at [facility], [address] [place], [place] [address]    Gram Stain NO WBC SEEN NO ORGANISMS SEEN   Final   Culture   Final    NO GROWTH 3 DAYS Performed at [facility], [address] [place], [place] [address]    Report Status 05/28/2022 FINAL  Final     Time coordinating discharge: Over 30 minutes  SIGNED:   Tresa Moore, MD  [facility] 06/02/2022, 12:12 PM Pager   If 7PM-7AM, please contact night-coverage

## 2022-06-02 NOTE — Progress Notes (Signed)
Pt's ride present [facility] the [facility]; pt discharged via wheelchair by nursing to the [facility] entrance 

## 2022-06-02 NOTE — Plan of Care (Signed)

## 2022-06-02 NOTE — TOC Transition Note (Signed)
Transition of Care [facility]) - CM/SW Discharge Note   Patient Details  Name: Cameron Santiago MRN: [id number] Date of Birth: 1995-09-01  [facility] of [facility]) CM/SW Contact:  Allena Katz, LCSW Phone Number: 06/02/2022, 10:15 AM   Clinical Narrative:   Pt has orders to discharge. Substance use resources added to AVS. RW ordered to be delivered to patients room through [employer]. Pt aware of this. CSW signing off.           Patient Goals and CMS Choice      Discharge Placement                         Discharge Plan and Services Additional resources added to the After Visit Summary for                                       Social Determinants of Health (SDOH) Interventions SDOH Screenings   Food Insecurity: No Food Insecurity (05/11/2022)  Housing: Low Risk  (05/11/2022)  Transportation Needs: Unmet Transportation Needs (05/11/2022)  Utilities: Not At Risk (05/11/2022)  Tobacco Use: Low Risk  (05/11/2022)     Readmission Risk Interventions     No data to display

## 2022-09-21 ENCOUNTER — Other Ambulatory Visit: Payer: Self-pay

## 2022-09-21 ENCOUNTER — Ambulatory Visit: Payer: Medicaid Other | Admitting: Gastroenterology

## 2023-10-04 ENCOUNTER — Other Ambulatory Visit: Payer: Self-pay

## 2023-10-04 ENCOUNTER — Emergency Department: Payer: Self-pay

## 2023-10-04 ENCOUNTER — Emergency Department
Admission: EM | Admit: 2023-10-04 | Discharge: 2023-10-04 | Disposition: A | Discharge status: Home / Self Care | Payer: Self-pay | Attending: Emergency Medicine | Admitting: Emergency Medicine

## 2023-10-04 DIAGNOSIS — R4182 Altered mental status, unspecified: Secondary | ICD-10-CM | POA: Insufficient documentation

## 2023-10-04 HISTORY — DX: Heart failure, unspecified: I50.9

## 2023-10-04 HISTORY — DX: Unspecified cirrhosis of liver: K74.60

## 2023-10-04 LAB — URINE DRUG SCREEN, QUALITATIVE (ARMC ONLY)
Amphetamines, Ur Screen: NOT DETECTED
Barbiturates, Ur Screen: NOT DETECTED
Benzodiazepine, Ur Scrn: NOT DETECTED
Cannabinoid 50 Ng, Ur ~~LOC~~: NOT DETECTED
Cocaine Metabolite,Ur ~~LOC~~: NOT DETECTED
MDMA (Ecstasy)Ur Screen: NOT DETECTED
Methadone Scn, Ur: NOT DETECTED
Opiate, Ur Screen: NOT DETECTED
Phencyclidine (PCP) Ur S: NOT DETECTED
Tricyclic, Ur Screen: NOT DETECTED

## 2023-10-04 LAB — COMPREHENSIVE METABOLIC PANEL WITH GFR
ALT: 33 U/L (ref 0–44)
AST: 66 U/L — ABNORMAL HIGH (ref 15–41)
Albumin: 3.3 g/dL — ABNORMAL LOW (ref 3.5–5.0)
Alkaline Phosphatase: 106 U/L (ref 38–126)
Anion gap: 12 (ref 5–15)
BUN: 6 mg/dL (ref 6–20)
CO2: 24 mmol/L (ref 22–32)
Calcium: 9.3 mg/dL (ref 8.9–10.3)
Chloride: 100 mmol/L (ref 98–111)
Creatinine, Ser: 0.51 mg/dL — ABNORMAL LOW (ref 0.61–1.24)
GFR, Estimated: >60 mL/min (ref >60)
Glucose, Bld: 99 mg/dL (ref 70–99)
Potassium: 3.3 mmol/L — ABNORMAL LOW (ref 3.5–5.1)
Sodium: 136 mmol/L (ref 135–145)
Total Bilirubin: 2.2 mg/dL — ABNORMAL HIGH (ref 0.0–1.2)
Total Protein: 8 g/dL (ref 6.5–8.1)

## 2023-10-04 LAB — SALICYLATE LEVEL: Salicylate Lvl: <7 mg/dL — ABNORMAL LOW (ref 7.0–30.0)

## 2023-10-04 LAB — CBC WITH DIFFERENTIAL/PLATELET
Abs Immature Granulocytes: 0.02 K/uL (ref 0.00–0.07)
Basophils Absolute: 0.1 K/uL (ref 0.0–0.1)
Basophils Relative: 1 %
Eosinophils Absolute: 0.3 K/uL (ref 0.0–0.5)
Eosinophils Relative: 4 %
HCT: 33.9 % — ABNORMAL LOW (ref 39.0–52.0)
Hemoglobin: 11.4 g/dL — ABNORMAL LOW (ref 13.0–17.0)
Immature Granulocytes: 0 %
Lymphocytes Relative: 36 %
Lymphs Abs: 2.4 K/uL (ref 0.7–4.0)
MCH: 29.5 pg (ref 26.0–34.0)
MCHC: 33.6 g/dL (ref 30.0–36.0)
MCV: 87.8 fL (ref 80.0–100.0)
Monocytes Absolute: 0.7 K/uL (ref 0.1–1.0)
Monocytes Relative: 11 %
Neutro Abs: 3.1 K/uL (ref 1.7–7.7)
Neutrophils Relative %: 48 %
Platelets: 160 K/uL (ref 150–400)
RBC: 3.86 MIL/uL — ABNORMAL LOW (ref 4.22–5.81)
RDW: 17.2 % — ABNORMAL HIGH (ref 11.5–15.5)
WBC: 6.6 K/uL (ref 4.0–10.5)
nRBC: 0 % (ref 0.0–0.2)

## 2023-10-04 LAB — URINALYSIS, ROUTINE W REFLEX MICROSCOPIC
Bilirubin Urine: NEGATIVE
Glucose, UA: NEGATIVE mg/dL
Hgb urine dipstick: NEGATIVE
Ketones, ur: NEGATIVE mg/dL
Leukocytes,Ua: NEGATIVE
Nitrite: NEGATIVE
Protein, ur: NEGATIVE mg/dL
Specific Gravity, Urine: 1.002 — ABNORMAL LOW (ref 1.005–1.030)
pH: 6 (ref 5.0–8.0)

## 2023-10-04 LAB — ACETAMINOPHEN LEVEL: Acetaminophen (Tylenol), Serum: <10 ug/mL — ABNORMAL LOW (ref 10–30)

## 2023-10-04 LAB — PROTIME-INR
INR: 1.5 — ABNORMAL HIGH (ref 0.8–1.2)
Prothrombin Time: 18.7 s — ABNORMAL HIGH (ref 11.4–15.2)

## 2023-10-04 LAB — AMMONIA: Ammonia: 34 umol/L (ref 9–35)

## 2023-10-04 LAB — APTT: aPTT: 42 s — ABNORMAL HIGH (ref 24–36)

## 2023-10-04 MED ORDER — MIDAZOLAM HCL 2 MG/2ML IJ SOLN: 2.0000 mg | Freq: Once | INTRAMUSCULAR | Status: DC

## 2023-10-04 NOTE — Discharge Instructions (Signed)
 Your workup was reassuring but you can return to the ER for worsening symptoms or any other concern

## 2023-10-04 NOTE — ED Triage Notes (Signed)
 Pt arrived to ED via [facility] for AMS. Pt was just discharged from jail today and got home, drank a few beers, when his sister said he started hallucinating and acting weird. Pt denies any recreational drug use. Hx of cirrhosis and CHF. Pt states he doesn't take lactulose  anymore because he feels like he doesn't need it anymore. Pt is currently A&Ox4, although not answering questions appropriately. States he takes his furosemde, Denies any falls or hitting his head. Initially, EMS states that he was only responsive to painful stimuli. Pt states he wants to get out of here to get to to work. I asked pt where he worked at and he said That information is only for myself.

## 2023-10-04 NOTE — ED Notes (Signed)
 EKG given to Dr. Peggi Bowels

## 2023-10-04 NOTE — ED Provider Notes (Signed)
 [facility] Provider Note    Event Date/Time   First MD Initiated Contact with Patient 10/04/23 2020     (approximate)   History   Altered Mental Status   HPI  Cameron Santiago is a 28 y.o. male with history of alcohol cirrhosis who comes in with concerns for altered mental status.  Per EMS patient was out of jail yesterday per the family member however patient states he has been out of jail for some time now.  They found him unresponsive on the ground positive head trauma.  I asked if I could push on his abdomen to see if there is any tenderness and patient stated that he did not want me to evaluate that area but he denied any pain.  When I asked him why he was here he stated because iwant to look at my abdomen.  He was not able to really explain to me what prompted this visit.  Physical Exam   Triage Vital Signs: ED Triage Vitals  Encounter Vitals Group     BP 10/04/23 1945 (!) 173/89     Girls Systolic BP Percentile --      Girls Diastolic BP Percentile --      Boys Systolic BP Percentile --      Boys Diastolic BP Percentile --      Pulse Rate 10/04/23 1945 83     Resp 10/04/23 1945 18     Temp 10/04/23 1945 98.9 F (37.2 C)     Temp Source 10/04/23 1945 Oral     SpO2 10/04/23 1945 100 %     Weight 10/04/23 2031 213 lb (96.6 kg)     Height 10/04/23 2031 5' 11 (1.803 m)     Head Circumference --      Peak Flow --      Pain Score --      Pain Loc --      Pain Education --      Exclude from Growth Chart --     Most recent vital signs: Vitals:   10/04/23 1945  BP: (!) 173/89  Pulse: 83  Resp: 18  Temp: 98.9 F (37.2 C)  SpO2: 100%     General: Awake, no distress.  CV:  Good peripheral perfusion.  Resp:  Normal effort.  Abd:  No distention.  Other:  Patient is alert and oriented x 3 but he does have confusion onto why he is here and his medical condition. Moving all extremities.   ED Results / Procedures / Treatments    Labs (all labs ordered are listed, but only abnormal results are displayed) Labs Reviewed  CBC WITH DIFFERENTIAL/PLATELET  COMPREHENSIVE METABOLIC PANEL WITH GFR  ETHANOL  SALICYLATE LEVEL  ACETAMINOPHEN  LEVEL  AMMONIA  URINE DRUG SCREEN, QUALITATIVE ([facility] ONLY)  URINALYSIS, ROUTINE W REFLEX MICROSCOPIC     EKG  My interpretation of EKG:  Normal sinus rate of 83 without any ST elevation or T wave versions, normal intervals  RADIOLOGY I have reviewed the ct personally and interpreted no evidence of intracranial hemorrhage   PROCEDURES:  Critical Care performed: No  Procedures   MEDICATIONS ORDERED IN ED: Medications  midazolam  (VERSED ) injection 2 mg (has no administration in time range)     IMPRESSION / MDM / ASSESSMENT AND PLAN / ED COURSE  I reviewed the triage vital signs and the nursing notes.   Patient's presentation is most consistent with acute presentation with potential threat to life or bodily function.  Patient comes in with concerns for episode of altered mental status.  Workup will be done to evaluate for intercranial hemorrhage, ammonia to evaluate for cirrhosis, possible secondary to EtOH use.   9:27 PM patient's family is now at bedside and he states that he was just asleep and that his family member that made him come here just overreacts to things.  He reports feeling his normal self and mom at bedside reports that he is at his normal self.    IMPRESSION: 1. No acute intracranial abnormality. No skull fracture. 2. Paranasal sinus disease.  IMPRESSION: No acute fracture or subluxation of the cervical spine.  UA without evidence of UTI.  Ammonia level is negative.  INR slightly elevated but lower than priors.  His LFTs are similar to prior and actually much improved patient has known cirrhosis.  His white count is normal at 6 he has no fevers and denied any abdominal pain to suggest SBP.  Salicylate Tylenol  negative and no reports of any  ingestion.  Patient is at baseline self according to family and he is requesting discharge home.  He is alert and oriented x 4 and I do not see any concerns on blood work or CT imaging for this episode today.  He reports that he was just sleeping.  He has a safe ride home and is requesting to be discharged with I think is reasonable. Denies any SI and no indication for IVC.    FINAL CLINICAL IMPRESSION(S) / ED DIAGNOSES   Final diagnoses:  Altered mental status, unspecified altered mental status type     Rx / DC Orders   ED Discharge Orders     None        Note:  This document was prepared using Dragon voice recognition software and may include unintentional dictation errors.   Ernest Ronal BRAVO, MD 10/04/23 [phone]
